# Patient Record
Sex: Male | Born: 1945 | Race: White | Hispanic: No | Smoking: Light tobacco smoker
Health system: Southern US, Community
[De-identification: ages and names within clinical notes are randomized; demographics above are authoritative.]

## PROBLEM LIST (undated history)

## (undated) DIAGNOSIS — I219 Acute myocardial infarction, unspecified: Secondary | ICD-10-CM

## (undated) DIAGNOSIS — M1990 Unspecified osteoarthritis, unspecified site: Secondary | ICD-10-CM

## (undated) DIAGNOSIS — I2510 Atherosclerotic heart disease of native coronary artery without angina pectoris: Secondary | ICD-10-CM

## (undated) DIAGNOSIS — K635 Polyp of colon: Secondary | ICD-10-CM

## (undated) DIAGNOSIS — IMO0002 Reserved for concepts with insufficient information to code with codable children: Secondary | ICD-10-CM

## (undated) DIAGNOSIS — F4310 Post-traumatic stress disorder, unspecified: Secondary | ICD-10-CM

## (undated) DIAGNOSIS — I519 Heart disease, unspecified: Secondary | ICD-10-CM

## (undated) DIAGNOSIS — E790 Hyperuricemia without signs of inflammatory arthritis and tophaceous disease: Secondary | ICD-10-CM

## (undated) DIAGNOSIS — I10 Essential (primary) hypertension: Secondary | ICD-10-CM

## (undated) DIAGNOSIS — C439 Malignant melanoma of skin, unspecified: Secondary | ICD-10-CM

## (undated) DIAGNOSIS — Z720 Tobacco use: Secondary | ICD-10-CM

## (undated) DIAGNOSIS — I5032 Chronic diastolic (congestive) heart failure: Secondary | ICD-10-CM

## (undated) DIAGNOSIS — Z889 Allergy status to unspecified drugs, medicaments and biological substances status: Secondary | ICD-10-CM

## (undated) DIAGNOSIS — Z8739 Personal history of other diseases of the musculoskeletal system and connective tissue: Secondary | ICD-10-CM

## (undated) DIAGNOSIS — Z9861 Coronary angioplasty status: Secondary | ICD-10-CM

## (undated) DIAGNOSIS — F329 Major depressive disorder, single episode, unspecified: Secondary | ICD-10-CM

## (undated) DIAGNOSIS — E785 Hyperlipidemia, unspecified: Secondary | ICD-10-CM

## (undated) DIAGNOSIS — E119 Type 2 diabetes mellitus without complications: Secondary | ICD-10-CM

## (undated) HISTORY — PX: JOINT REPLACEMENT: SHX530

## (undated) HISTORY — DX: Reserved for concepts with insufficient information to code with codable children: IMO0002

## (undated) HISTORY — PX: CORONARY ANGIOPLASTY WITH STENT PLACEMENT: SHX49

## (undated) HISTORY — PX: TRANSTHORACIC ECHOCARDIOGRAM: SHX275

## (undated) HISTORY — DX: Polyp of colon: K63.5

## (undated) HISTORY — PX: SQUAMOUS CELL CARCINOMA EXCISION: SHX2433

## (undated) HISTORY — PX: INGUINAL HERNIA REPAIR: SUR1180

---
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
## 1989-06-30 DIAGNOSIS — F32A Depression, unspecified: Secondary | ICD-10-CM

## 1989-06-30 HISTORY — DX: Depression, unspecified: F32.A

## 1997-10-30 HISTORY — PX: CORONARY ANGIOPLASTY: SHX604

## 1999-07-01 HISTORY — PX: CARPAL TUNNEL RELEASE: SHX101

## 2012-12-26 ENCOUNTER — Encounter (HOSPITAL_COMMUNITY): Payer: Self-pay | Admitting: Adult Health

## 2012-12-26 ENCOUNTER — Inpatient Hospital Stay (HOSPITAL_COMMUNITY)
Admission: EM | Admit: 2012-12-26 | Discharge: 2012-12-31 | DRG: 247 | Disposition: A | Discharge status: Home / Self Care | Payer: Non-veteran care | Attending: Internal Medicine | Admitting: Internal Medicine

## 2012-12-26 ENCOUNTER — Emergency Department (HOSPITAL_COMMUNITY): Payer: Non-veteran care

## 2012-12-26 DIAGNOSIS — D649 Anemia, unspecified: Secondary | ICD-10-CM | POA: Diagnosis not present

## 2012-12-26 DIAGNOSIS — J019 Acute sinusitis, unspecified: Secondary | ICD-10-CM | POA: Diagnosis present

## 2012-12-26 DIAGNOSIS — J329 Chronic sinusitis, unspecified: Secondary | ICD-10-CM | POA: Diagnosis present

## 2012-12-26 DIAGNOSIS — Z7982 Long term (current) use of aspirin: Secondary | ICD-10-CM

## 2012-12-26 DIAGNOSIS — I10 Essential (primary) hypertension: Secondary | ICD-10-CM | POA: Diagnosis present

## 2012-12-26 DIAGNOSIS — Z79899 Other long term (current) drug therapy: Secondary | ICD-10-CM

## 2012-12-26 DIAGNOSIS — E119 Type 2 diabetes mellitus without complications: Secondary | ICD-10-CM | POA: Diagnosis present

## 2012-12-26 DIAGNOSIS — E785 Hyperlipidemia, unspecified: Secondary | ICD-10-CM | POA: Diagnosis present

## 2012-12-26 DIAGNOSIS — R079 Chest pain, unspecified: Secondary | ICD-10-CM

## 2012-12-26 DIAGNOSIS — Z955 Presence of coronary angioplasty implant and graft: Secondary | ICD-10-CM

## 2012-12-26 DIAGNOSIS — I252 Old myocardial infarction: Secondary | ICD-10-CM

## 2012-12-26 DIAGNOSIS — I2584 Coronary atherosclerosis due to calcified coronary lesion: Secondary | ICD-10-CM | POA: Diagnosis present

## 2012-12-26 DIAGNOSIS — I2510 Atherosclerotic heart disease of native coronary artery without angina pectoris: Principal | ICD-10-CM | POA: Diagnosis present

## 2012-12-26 DIAGNOSIS — J09X2 Influenza due to identified novel influenza A virus with other respiratory manifestations: Secondary | ICD-10-CM | POA: Diagnosis present

## 2012-12-26 DIAGNOSIS — J111 Influenza due to unidentified influenza virus with other respiratory manifestations: Secondary | ICD-10-CM | POA: Diagnosis present

## 2012-12-26 DIAGNOSIS — F17200 Nicotine dependence, unspecified, uncomplicated: Secondary | ICD-10-CM | POA: Diagnosis present

## 2012-12-26 DIAGNOSIS — Z9861 Coronary angioplasty status: Secondary | ICD-10-CM

## 2012-12-26 DIAGNOSIS — I200 Unstable angina: Secondary | ICD-10-CM | POA: Diagnosis present

## 2012-12-26 HISTORY — DX: Essential (primary) hypertension: I10

## 2012-12-26 HISTORY — DX: Tobacco use: Z72.0

## 2012-12-26 HISTORY — DX: Hyperlipidemia, unspecified: E78.5

## 2012-12-26 LAB — PROTIME-INR: Prothrombin Time: 14.6 seconds (ref 11.6–15.2)

## 2012-12-26 LAB — BASIC METABOLIC PANEL
CO2: 25 mEq/L (ref 19–32)
Calcium: 9.1 mg/dL (ref 8.4–10.5)
Creatinine, Ser: 0.93 mg/dL (ref 0.50–1.35)

## 2012-12-26 LAB — CBC
MCH: 30.6 pg (ref 26.0–34.0)
MCV: 86.8 fL (ref 78.0–100.0)
Platelets: 147 10*3/uL — ABNORMAL LOW (ref 150–400)
RBC: 4.61 MIL/uL (ref 4.22–5.81)
RDW: 14.2 % (ref 11.5–15.5)

## 2012-12-26 MED ORDER — NITROGLYCERIN 0.4 MG SL SUBL
0.4000 mg | SUBLINGUAL_TABLET | SUBLINGUAL | Status: DC | PRN
  Administered 2012-12-26: 0.4 mg via SUBLINGUAL
  Filled 2012-12-26 (×3): qty 25

## 2012-12-26 MED ORDER — ACETAMINOPHEN 325 MG PO TABS
650.0000 mg | ORAL_TABLET | Freq: Once | ORAL | Status: AC
  Administered 2012-12-26: 650 mg via ORAL
  Filled 2012-12-26: qty 2

## 2012-12-26 MED ORDER — NITROGLYCERIN 0.4 MG SL SUBL
0.4000 mg | SUBLINGUAL_TABLET | SUBLINGUAL | Status: DC | PRN
  Administered 2012-12-26: 0.4 mg via SUBLINGUAL

## 2012-12-26 MED ORDER — NITROGLYCERIN 2 % TD OINT
0.5000 [in_us] | TOPICAL_OINTMENT | Freq: Four times a day (QID) | TRANSDERMAL | Status: DC
  Administered 2012-12-26 – 2012-12-30 (×13): 0.5 [in_us] via TOPICAL
  Filled 2012-12-26 (×2): qty 1
  Filled 2012-12-26: qty 30

## 2012-12-26 MED ORDER — ASPIRIN 325 MG PO TABS: 325.0000 mg | ORAL_TABLET | ORAL | Status: AC

## 2012-12-26 NOTE — ED Notes (Signed)
Presents with chest tightness that began today associated with sharp upper back pain and left leg numbness and lightheadedness and SOB. Pt states, "It feels the same as when I had a heart attack" denies nausea.  Rest makes pain better.

## 2012-12-26 NOTE — ED Provider Notes (Signed)
I saw and evaluated the patient, reviewed the resident's note and I agree with the findings and plan.  Agree with EKG interpretation if present.   Pt with history of CAD, having CP and L arm pain today. EKG and trop neg. Plan admit for rule out.   Wadie Liew B. Bernette Mayers, MD 12/26/12 2231

## 2012-12-26 NOTE — H&P (Signed)
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  Feels similar to his heart attack in 1999.  [facility]  Has had several stress testing since then, assumes they were normal, no cath since 1999.  He took some asa with some relief of the pain.  However his pain was not fully relieved until he received ntg sl in the ED.  He is currently cp free.  No sob.  No fevers.  No le edema or swelling.  No cough.  Review of Systems:  Positive and negative as per HPI otherwise all other systems are negative  Past Medical History: Past Medical History  Diagnosis Date  . Hypertension   . Acute MI   . Hyperlipidemia     Medications: Prior to Admission medications   Medication Sig Start Date End Date Taking? Authorizing Provider  amLODipine (NORVASC) 10 MG tablet Take 10 mg by mouth daily.   Yes Historical Provider, MD  aspirin 81 MG chewable tablet Chew 81 mg by mouth daily.   Yes Historical Provider, MD  lisinopril (PRINIVIL,ZESTRIL) 40 MG tablet Take 20 mg by mouth daily.   Yes Historical Provider, MD  metFORMIN (GLUMETZA) 500 MG (MOD) 24 hr tablet Take 500 mg by mouth daily with breakfast.   Yes Historical Provider, MD  pravastatin (PRAVACHOL) 40 MG tablet Take 20 mg by mouth daily.   Yes Historical Provider, MD    Allergies:  No Known Allergies  Social History:  reports that he has been smoking Cigars.  He does not have any smokeless tobacco history on file. He reports that  drinks alcohol. He reports that he does not use illicit drugs.  Family History: History reviewed. No pertinent family history.  Physical Exam: Filed Vitals:   12/26/12 1823 12/26/12 2030 12/26/12 2045 12/26/12 2100   BP: 157/82 134/73 143/72 144/80  Pulse: 80 79 81 78  Temp: 99 F (37.2 C)     Resp: 18 16 23 20   SpO2: 96% 96% 97% 96%   General appearance: alert, cooperative and no distress Head: Normocephalic, without obvious abnormality, atraumatic Eyes: negative Nose: Nares normal. Septum midline. Mucosa normal. No drainage or sinus tenderness. Neck: no carotid bruit, no JVD, supple, symmetrical, trachea midline and thyroid not enlarged, symmetric, no tenderness/mass/nodules Lungs: clear to auscultation bilaterally Heart: regular rate and rhythm, S1, S2 normal, no murmur, click, rub or gallop Abdomen: soft, non-tender; bowel sounds normal; no masses,  no organomegaly Extremities: extremities normal, atraumatic, no cyanosis or edema Pulses: 2+ and symmetric Skin: Skin color, texture, turgor normal. No rashes or lesions Neurologic: Grossly normal    Labs on Admission:   Recent Labs  12/26/12 1839  NA 137  K 3.9  CL 101  CO2 25  GLUCOSE 105*  BUN 16  CREATININE 0.93  CALCIUM 9.1    Recent Labs  12/26/12 1839  WBC 5.3  HGB 14.1  HCT 40.0  MCV 86.8  PLT 147*   Radiological Exams on Admission: Dg Chest 2 View  12/26/2012  *RADIOLOGY REPORT*  Clinical Data: Chest pain.  Diabetic, hypertensive.  CHEST - 2 VIEW  Comparison: None.  Findings: Cardiomegaly.  Clear lung fields.  No effusion or pneumothorax.  Bones unremarkable.  IMPRESSION: No active cardiopulmonary disease.   Original Report Authenticated By: Davonna Belling, M.D.     Assessment/Plan 67 yo male with unstable angina  Principal Problem:   Unstable angina Active Problems:   Hypertension   Hyperlipidemia   CAD S/P percutaneous coronary angioplasty  Full dose lovenox.  Asa.  Hold norvasc and start bblocker.  Statin.  Serial enzymes.  ekg with no acute changes with tw inversion inf lead.  ntg paste.  Echo in am.  Probably needs cardiology consult for possible cath?/stress testing.  Full code.  Tele.  DAVID,RACHAL  A 12/26/2012, 9:52 PM

## 2012-12-26 NOTE — ED Provider Notes (Signed)
History     CSN: [id number]  Arrival date & time 12/26/12  4098   First MD Initiated Contact with Patient 12/26/12 2042      Chief Complaint  Patient presents with  . Chest Pain    (Consider location/radiation/quality/duration/timing/severity/associated sxs/prior treatment) Patient is a 67 y.o. male presenting with chest pain.  Chest Pain Pain location:  Substernal area Pain quality: tightness   Pain radiates to:  L arm Pain radiates to the back: yes   Pain severity:  Moderate Onset quality:  Unable to specify Duration:  8 hours Timing:  Constant Progression:  Improving Chronicity:  New Context: at rest   Context: not breathing, not eating, not lifting, no movement and no stress   Relieved by:  Aspirin Worsened by:  Nothing tried Ineffective treatments:  None tried Associated symptoms: back pain and cough   Associated symptoms: no abdominal pain, no anorexia, no anxiety, no diaphoresis, no fatigue, no fever, no headache, no nausea, no near-syncope, no numbness, no palpitations, no shortness of breath, not vomiting and no weakness   Risk factors: coronary artery disease, hypertension and male sex     Past Medical History  Diagnosis Date  . Hypertension   . Acute MI   . Hyperlipidemia     History reviewed. No pertinent past surgical history.  History reviewed. No pertinent family history.  History  Substance Use Topics  . Smoking status: Current Every Day Smoker    Types: Cigars  . Smokeless tobacco: Not on file  . Alcohol Use: Yes      Review of Systems  Constitutional: Negative for fever, chills, diaphoresis, activity change, appetite change and fatigue.  HENT: Negative for congestion, sore throat, facial swelling, rhinorrhea, drooling, neck pain and voice change.   Respiratory: Positive for cough. Negative for shortness of breath and stridor.   Cardiovascular: Positive for chest pain. Negative for palpitations and near-syncope.  Gastrointestinal:  Negative for nausea, vomiting, abdominal pain, diarrhea, abdominal distention and anorexia.  Endocrine: Negative for polydipsia and polyuria.  Genitourinary: Negative for dysuria, urgency, frequency and decreased urine volume.  Musculoskeletal: Positive for back pain. Negative for gait problem.  Skin: Negative for color change and wound.  Neurological: Negative for facial asymmetry, weakness, numbness and headaches.  Hematological: Does not bruise/bleed easily.  Psychiatric/Behavioral: Negative for confusion and agitation.    Allergies  Review of patient's allergies indicates no known allergies.  Home Medications   Current Outpatient Rx  Name  Route  Sig  Dispense  Refill  . amLODipine (NORVASC) 10 MG tablet   Oral   Take 10 mg by mouth daily.         Marland Kitchen aspirin 81 MG chewable tablet   Oral   Chew 81 mg by mouth daily.         Marland Kitchen lisinopril (PRINIVIL,ZESTRIL) 40 MG tablet   Oral   Take 20 mg by mouth daily.         . metFORMIN (GLUMETZA) 500 MG (MOD) 24 hr tablet   Oral   Take 500 mg by mouth daily with breakfast.         . pravastatin (PRAVACHOL) 40 MG tablet   Oral   Take 20 mg by mouth daily.           BP 122/79  Pulse 72  Temp(Src) 98.5 F (36.9 C) (Oral)  Resp 17  SpO2 96%  Physical Exam  Constitutional: He is oriented to person, place, and time. He appears well-developed and well-nourished. No  distress.  HENT:  Head: Normocephalic and atraumatic.  Mouth/Throat: No oropharyngeal exudate.  Eyes: Pupils are equal, round, and reactive to light.  Neck: Normal range of motion. Neck supple.  Cardiovascular: Normal rate, regular rhythm and normal heart sounds.  Exam reveals no gallop and no friction rub.   No murmur heard. Pulmonary/Chest: Effort normal and breath sounds normal. No respiratory distress. He has no wheezes. He has no rales.  Abdominal: Soft. Bowel sounds are normal. He exhibits no distension and no mass. There is no tenderness. There is no  rebound and no guarding.  Musculoskeletal: Normal range of motion. He exhibits no edema and no tenderness.  Neurological: He is alert and oriented to person, place, and time.  Skin: Skin is warm and dry.  Psychiatric: He has a normal mood and affect.    ED Course  Procedures (including critical care time)  Labs Reviewed  CBC - Abnormal; Notable for the following:    Platelets 147 (*)    All other components within normal limits  BASIC METABOLIC PANEL - Abnormal; Notable for the following:    Glucose, Bld 105 (*)    GFR calc non Af Amer 87 (*)    All other components within normal limits  PROTIME-INR  POCT I-STAT TROPONIN I   Dg Chest 2 View  12/26/2012  *RADIOLOGY REPORT*  Clinical Data: Chest pain.  Diabetic, hypertensive.  CHEST - 2 VIEW  Comparison: None.  Findings: Cardiomegaly.  Clear lung fields.  No effusion or pneumothorax.  Bones unremarkable.  IMPRESSION: No active cardiopulmonary disease.   Original Report Authenticated By: Davonna Belling, M.D.      1. Chest pain   2. CAD S/P percutaneous coronary angioplasty   3. Hyperlipidemia   4. Hypertension   5. Unstable angina      Date: 12/26/2012  Rate: 90  Rhythm: normal sinus rhythm  QRS Axis: normal  Intervals: normal  ST/T Wave abnormalities: nonspecific T wave changes and q waves inf leads  Conduction Disutrbances:none  Narrative Interpretation:   Old EKG Reviewed: none available    MDM  Pt is a 67 y.o. 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  At worst pain 5/10, currently 2/10.  Took ASA prior to arrival.  He has had mild cough since January and is recovering from bronchitis, but has had no recent fevers, leg pain or swelling. EKG NSR w/ non-specific T waves changes.  Trop negative, CXR w/ cardiomegaly.  Doubt DVT/PE, dissection. Given PMHx there is  some concern for ACS.  Other possibilities include GERD/gastritis, MSK pain.  Have consulted unassigned medical team for CP rule out.   10:00 PM pain resolved after 2 SL NTG, paste placed.  [facility] will admit for ACS r/o.    1. Chest pain   2. CAD S/P percutaneous coronary angioplasty   3. Hyperlipidemia   4. Hypertension   5. Unstable angina      Labs and imaging considered in decision making, reviewed by myself.  Imaging interpreted by radiology. Pt care discussed with my attending, Dr. Bernette Mayers.    Toy Cookey, MD 12/26/12 [phone]

## 2012-12-27 ENCOUNTER — Encounter (HOSPITAL_COMMUNITY): Payer: Self-pay | Admitting: General Practice

## 2012-12-27 ENCOUNTER — Encounter (HOSPITAL_COMMUNITY)
Admission: EM | Disposition: A | Discharge status: Home / Self Care | Payer: Self-pay | Source: Home / Self Care | Attending: Internal Medicine

## 2012-12-27 DIAGNOSIS — I517 Cardiomegaly: Secondary | ICD-10-CM

## 2012-12-27 DIAGNOSIS — J329 Chronic sinusitis, unspecified: Secondary | ICD-10-CM

## 2012-12-27 DIAGNOSIS — I2510 Atherosclerotic heart disease of native coronary artery without angina pectoris: Secondary | ICD-10-CM

## 2012-12-27 DIAGNOSIS — I200 Unstable angina: Secondary | ICD-10-CM

## 2012-12-27 HISTORY — PX: LEFT HEART CATHETERIZATION WITH CORONARY ANGIOGRAM: SHX5451

## 2012-12-27 LAB — CBC
MCH: 31 pg (ref 26.0–34.0)
Platelets: 125 10*3/uL — ABNORMAL LOW (ref 150–400)
RBC: 4.23 MIL/uL (ref 4.22–5.81)
WBC: 4.2 10*3/uL (ref 4.0–10.5)

## 2012-12-27 LAB — HEMOGLOBIN A1C: Mean Plasma Glucose: 131 mg/dL — ABNORMAL HIGH (ref <117)

## 2012-12-27 LAB — LIPID PANEL
Cholesterol: 142 mg/dL (ref 0–200)
HDL: 34 mg/dL — ABNORMAL LOW (ref >39)
Total CHOL/HDL Ratio: 4.2 RATIO
Triglycerides: 116 mg/dL (ref <150)
VLDL: 23 mg/dL (ref 0–40)

## 2012-12-27 LAB — GLUCOSE, CAPILLARY
Glucose-Capillary: 97 mg/dL (ref 70–99)
Glucose-Capillary: 143 mg/dL — ABNORMAL HIGH (ref 70–99)
Glucose-Capillary: 97 mg/dL (ref 70–99)

## 2012-12-27 LAB — CREATININE, SERUM: Creatinine, Ser: 1 mg/dL (ref 0.50–1.35)

## 2012-12-27 SURGERY — LEFT HEART CATHETERIZATION WITH CORONARY ANGIOGRAM
Anesthesia: LOCAL

## 2012-12-27 MED ORDER — TICAGRELOR 90 MG PO TABS
90.0000 mg | ORAL_TABLET | Freq: Two times a day (BID) | ORAL | Status: DC
  Administered 2012-12-27 – 2012-12-31 (×8): 90 mg via ORAL
  Filled 2012-12-27 (×9): qty 1

## 2012-12-27 MED ORDER — HEPARIN (PORCINE) IN NACL 2-0.9 UNIT/ML-% IJ SOLN
INTRAMUSCULAR | Status: AC
  Filled 2012-12-27: qty 1000

## 2012-12-27 MED ORDER — FLUTICASONE PROPIONATE 50 MCG/ACT NA SUSP
2.0000 | Freq: Every day | NASAL | Status: DC
  Administered 2012-12-27 – 2012-12-29 (×3): 2 via NASAL
  Filled 2012-12-27: qty 16

## 2012-12-27 MED ORDER — BENZONATATE 100 MG PO CAPS
100.0000 mg | ORAL_CAPSULE | Freq: Three times a day (TID) | ORAL | Status: DC | PRN
  Administered 2012-12-27: 100 mg via ORAL
  Filled 2012-12-27: qty 1

## 2012-12-27 MED ORDER — MIDAZOLAM HCL 2 MG/2ML IJ SOLN
INTRAMUSCULAR | Status: AC
  Filled 2012-12-27: qty 2

## 2012-12-27 MED ORDER — SODIUM CHLORIDE 0.9 % IV SOLN: 250.0000 mL | INTRAVENOUS | Status: DC | PRN

## 2012-12-27 MED ORDER — CEFUROXIME AXETIL 250 MG PO TABS
250.0000 mg | ORAL_TABLET | Freq: Two times a day (BID) | ORAL | Status: DC
  Administered 2012-12-27 – 2012-12-31 (×7): 250 mg via ORAL
  Filled 2012-12-27 (×11): qty 1

## 2012-12-27 MED ORDER — FENTANYL CITRATE 0.05 MG/ML IJ SOLN
INTRAMUSCULAR | Status: AC
  Filled 2012-12-27: qty 2

## 2012-12-27 MED ORDER — SODIUM CHLORIDE 0.9 % IV SOLN: INTRAVENOUS | Status: AC

## 2012-12-27 MED ORDER — NITROGLYCERIN 0.4 MG SL SUBL: 0.4000 mg | SUBLINGUAL_TABLET | SUBLINGUAL | Status: DC | PRN

## 2012-12-27 MED ORDER — ISOSORBIDE MONONITRATE ER 30 MG PO TB24
30.0000 mg | ORAL_TABLET | Freq: Every day | ORAL | Status: DC
  Administered 2012-12-27 – 2012-12-30 (×4): 30 mg via ORAL
  Filled 2012-12-27 (×5): qty 1

## 2012-12-27 MED ORDER — LORATADINE 10 MG PO TABS
10.0000 mg | ORAL_TABLET | Freq: Every day | ORAL | Status: DC
  Administered 2012-12-27 – 2012-12-31 (×5): 10 mg via ORAL
  Filled 2012-12-27 (×5): qty 1

## 2012-12-27 MED ORDER — METOPROLOL TARTRATE 25 MG PO TABS
25.0000 mg | ORAL_TABLET | Freq: Two times a day (BID) | ORAL | Status: DC
  Administered 2012-12-27 – 2012-12-31 (×10): 25 mg via ORAL
  Filled 2012-12-27 (×11): qty 1

## 2012-12-27 MED ORDER — ACETAMINOPHEN 325 MG PO TABS
650.0000 mg | ORAL_TABLET | ORAL | Status: DC | PRN
  Administered 2012-12-27 – 2012-12-29 (×5): 650 mg via ORAL

## 2012-12-27 MED ORDER — ASPIRIN 81 MG PO CHEW: 324.0000 mg | CHEWABLE_TABLET | ORAL | Status: AC

## 2012-12-27 MED ORDER — SIMVASTATIN 20 MG PO TABS
20.0000 mg | ORAL_TABLET | Freq: Every day | ORAL | Status: DC
  Filled 2012-12-27 (×2): qty 1

## 2012-12-27 MED ORDER — SODIUM CHLORIDE 0.9 % IJ SOLN
3.0000 mL | INTRAMUSCULAR | Status: DC | PRN
  Administered 2012-12-27: 3 mL via INTRAVENOUS

## 2012-12-27 MED ORDER — ENOXAPARIN SODIUM 100 MG/ML ~~LOC~~ SOLN
100.0000 mg | SUBCUTANEOUS | Status: DC
  Filled 2012-12-27: qty 1

## 2012-12-27 MED ORDER — ASPIRIN 81 MG PO CHEW
324.0000 mg | CHEWABLE_TABLET | ORAL | Status: AC
  Administered 2012-12-27: 324 mg via ORAL
  Filled 2012-12-27: qty 4

## 2012-12-27 MED ORDER — SODIUM CHLORIDE 0.9 % IJ SOLN: 3.0000 mL | INTRAMUSCULAR | Status: DC | PRN

## 2012-12-27 MED ORDER — ONDANSETRON HCL 4 MG/2ML IJ SOLN: 4.0000 mg | Freq: Four times a day (QID) | INTRAMUSCULAR | Status: DC | PRN

## 2012-12-27 MED ORDER — ASPIRIN EC 81 MG PO TBEC
81.0000 mg | DELAYED_RELEASE_TABLET | Freq: Every day | ORAL | Status: DC
  Administered 2012-12-27 – 2012-12-31 (×5): 81 mg via ORAL
  Filled 2012-12-27 (×5): qty 1

## 2012-12-27 MED ORDER — VERAPAMIL HCL 2.5 MG/ML IV SOLN
INTRAVENOUS | Status: AC
  Filled 2012-12-27: qty 2

## 2012-12-27 MED ORDER — SODIUM CHLORIDE 0.9 % IJ SOLN
3.0000 mL | Freq: Two times a day (BID) | INTRAMUSCULAR | Status: DC
  Administered 2012-12-27 – 2012-12-30 (×6): 3 mL via INTRAVENOUS

## 2012-12-27 MED ORDER — ENOXAPARIN SODIUM 100 MG/ML ~~LOC~~ SOLN
100.0000 mg | Freq: Two times a day (BID) | SUBCUTANEOUS | Status: DC
  Filled 2012-12-27 (×2): qty 1

## 2012-12-27 MED ORDER — LIDOCAINE HCL (PF) 1 % IJ SOLN
INTRAMUSCULAR | Status: AC
  Filled 2012-12-27: qty 30

## 2012-12-27 MED ORDER — SODIUM CHLORIDE 0.9 % IJ SOLN
3.0000 mL | Freq: Two times a day (BID) | INTRAMUSCULAR | Status: DC
  Administered 2012-12-27: 3 mL via INTRAVENOUS

## 2012-12-27 MED ORDER — ASPIRIN 300 MG RE SUPP
300.0000 mg | RECTAL | Status: AC
  Filled 2012-12-27: qty 1

## 2012-12-27 MED ORDER — LISINOPRIL 20 MG PO TABS
20.0000 mg | ORAL_TABLET | Freq: Every day | ORAL | Status: DC
Start: 2012-12-27 — End: 2012-12-31
  Administered 2012-12-27 – 2012-12-31 (×5): 20 mg via ORAL
  Filled 2012-12-27 (×5): qty 1

## 2012-12-27 MED ORDER — DIAZEPAM 5 MG PO TABS
5.0000 mg | ORAL_TABLET | ORAL | Status: AC
  Administered 2012-12-27: 5 mg via ORAL
  Filled 2012-12-27: qty 1

## 2012-12-27 MED ORDER — ACETAMINOPHEN 325 MG PO TABS
650.0000 mg | ORAL_TABLET | ORAL | Status: DC | PRN
  Administered 2012-12-27 (×2): 650 mg via ORAL
  Filled 2012-12-27 (×8): qty 2

## 2012-12-27 MED ORDER — HEPARIN SODIUM (PORCINE) 1000 UNIT/ML IJ SOLN
INTRAMUSCULAR | Status: AC
  Filled 2012-12-27: qty 1

## 2012-12-27 MED ORDER — HEPARIN SODIUM (PORCINE) 5000 UNIT/ML IJ SOLN
5000.0000 [IU] | Freq: Three times a day (TID) | INTRAMUSCULAR | Status: DC
  Administered 2012-12-27 – 2012-12-29 (×7): 5000 [IU] via SUBCUTANEOUS
  Filled 2012-12-27 (×11): qty 1

## 2012-12-27 NOTE — Progress Notes (Signed)
Patient ID: Kenneth [name]  male  MRN:[id number]    DOB: 08/24/1946    DOA: 12/26/2012  PCP: Default, Provider, MD  Assessment/Plan: Principal Problem:   Unstable angina:typical anginal pain with exertion, high risk factors of prior MI in 1999, hypertension, hyperlipidemia, no recent cardiac workup since 2009 - Chest pain currently resolved, 2 sets of cardiac enzymes negative, qw and twi in III, avf, no prior EKG to compare with - 2-D echo today, discussed with [facility] cardiology for consultation, may need cardiac cath vs stress test, will defer the decision to cardiology - cont ASA, BB, lisinopril, zocor, currently on full dose Lovenox   Active Problems:   Hypertension:BP stable     Hyperlipidemia: LDL 85 - cont zocor    Unspecified sinusitis (chronic) with fever - placed on flonase, ceftin x 10days, claritin   DVT Prophylaxis: Lovenox  Code Status: Full code  Disposition:    Subjective:  Chest pain resolved, No SOB, N/V, ate breakfast  Objective: Weight change:   Intake/Output Summary (Last 24 hours) at 12/27/12 1036 Last data filed at 12/27/12 0152  Gross per 24 hour  Intake     75 ml  Output    200 ml  Net   -125 ml   Blood pressure 143/73, pulse 87, temperature 98.8 F (37.1 C), temperature source Oral, resp. rate 18, height 5' 8" (1.727 m), weight 103.556 kg (228 lb 4.8 oz), SpO2 95.00%.  Physical Exam: General: Alert and awake, oriented x3, not in any acute distress. HEENT: anicteric sclera, pupils reactive to light and accommodation, EOMI CVS: S1-S2 clear, no murmur rubs or gallops Chest: clear to auscultation bilaterally, no wheezing, rales or rhonchi Abdomen: soft nontender, nondistended, normal bowel sounds, no organomegaly Extremities: no cyanosis, clubbing or edema noted bilaterally Neuro: Cranial nerves II-XII intact, no focal neurological deficits  Lab Results: Basic Metabolic Panel:  Recent Labs Lab 12/26/12 1839  NA 137  K 3.9  CL 101  CO2  25  GLUCOSE 105*  BUN 16  CREATININE 0.93  CALCIUM 9.1   CBC:  Recent Labs Lab 12/26/12 1839  WBC 5.3  HGB 14.1  HCT 40.0  MCV 86.8  PLT 147*   Cardiac Enzymes:  Recent Labs Lab 12/27/12 0110 12/27/12 0605  TROPONINI <0.30 <0.30   BNP: No components found with this basename: POCBNP,  CBG:  Recent Labs Lab 12/27/12 0046 12/27/12 0735  GLUCAP 123* 119*     Micro Results: No results found for this or any previous visit (from the past 240 hour(s)).  Studies/Results: Dg Chest 2 View  12/26/2012  *RADIOLOGY REPORT*  Clinical Data: Chest pain.  Diabetic, hypertensive.  CHEST - 2 VIEW  Comparison: None.  Findings: Cardiomegaly.  Clear lung fields.  No effusion or pneumothorax.  Bones unremarkable.  IMPRESSION: No active cardiopulmonary disease.   Original Report Authenticated By: John Curnes, M.D.     Medications: Scheduled Meds: . aspirin  324 mg Oral NOW   Or  . aspirin  300 mg Rectal NOW  . aspirin EC  81 mg Oral Daily  . aspirin  325 mg Oral STAT  . cefUROXime  250 mg Oral BID WC  . enoxaparin (LOVENOX) injection  100 mg Subcutaneous Q12H  . enoxaparin (LOVENOX) injection  100 mg Subcutaneous NOW  . fluticasone  2 spray Each Nare Daily  . lisinopril  20 mg Oral Daily  . loratadine  10 mg Oral Daily  . metoprolol tartrate  25 mg Oral BID  . nitroGLYCERIN    0.5 inch Topical Q6H  . simvastatin  20 mg Oral q1800  . sodium chloride  3 mL Intravenous Q12H      LOS: 1 day   RAI,RIPUDEEP M.D. [facility] 12/27/2012, 10:36 AM Pager: [phone]  If 7PM-7AM, please contact night-coverage [email or web address] Password [email or web address]  

## 2012-12-27 NOTE — CV Procedure (Addendum)
Cardiac Cath Procedure Note:  Indication: [place]  Procedures performed:  1) Selective coronary angiography 2) Left heart catheterization 3) Left ventriculogram  Description of procedure:   The risks and indication of the procedure were explained. Consent was signed and placed on the chart. An appropriate timeout was taken prior to the procedure. After a normal Allen's test was confirmed, the right wrist was prepped and draped in the routine sterile fashion and anesthetized with 1% local lidocaine.   A 5 FR arterial sheath was then placed in the right radial artery using a modified Seldinger technique. Systemic heparin was administered. 3mg  IV verapamil was given through the sheath. Standard catheters including a JL 4, AR-2 and straight pigtail were used. All catheter exchanges were made over a wire.  Complications:  None apparent  Findings:  Ao Pressure: 113/69 (88) LV Pressure:  116/15/23 There was no signficant gradient across the aortic valve on pullback.  Left main:  Mild plaque  LAD:  Diffuse mild plaque throughout with 60-70% lesion in midsection  LCX: Gives off 2 OMs and 2 PLs and large atrial branch. 30% proximal lesion in LCX. Diffuse 40% plaque in mid AV groove LCX. OM-1 40% mid.   RCA: Large dominant vessel with diffuse caclific disease. There is moderate sized PDA and PL1 and very large PL2. In RCA proper 30-40% ostial. Diffuse 40-50% in midsection. In mid to distal segment focal 80-90% lesion (mildly hazy). Followed by diffuse 30% plaque then focal 70-80% stenosis  LV-gram done in the RAO projection: Ejection fraction = 40% inferior wall hypokinesis  Assessment: 1. 3V-CAD as described above with high grade lesion in mid to distal RCA and borderline lesion in mid LAD 2. 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 [place]. RCA lesion seems to be culprit lesion for his symptoms however LAD lesion may also be significant. Will plan PCI RCA and  flow-wire interrogation of LAD on Monday first case (unable to do case this afternoon due to 2 STEMISs). Start Brillinta.   Yui Mulvaney,MD 4:16 PM

## 2012-12-27 NOTE — Interval H&P Note (Signed)
History and Physical Interval Note:  12/27/2012 3:17 PM  Kenneth [name]  has presented today for surgery, with the diagnosis of [place]  The various methods of treatment have been discussed with the patient and family. After consideration of risks, benefits and other options for treatment, the patient has consented to  Procedure(s): LEFT HEART CATHETERIZATION WITH CORONARY ANGIOGRAM (N/A) as a surgical intervention .  The patient's history has been reviewed, patient examined, no change in status, stable for surgery.  I have reviewed the patient's chart and labs.  Questions were answered to the patient's satisfaction.     [name]

## 2012-12-27 NOTE — Care Management Note (Unsigned)
    Page 1 of 1   12/27/2012     10:44:39 AM   CARE MANAGEMENT NOTE 12/27/2012  Patient:  Kenneth [name], Kenneth [name]   Account Number:  [id number]  Date Initiated:  12/27/2012  Documentation initiated by:  GRAVES-BIGELOW,Starr Engel  Subjective/Objective Assessment:   Pt admitted with cp. Pt has PCP at [facility] in [facility] MD [place].     Action/Plan:   CM did call [facility] to make them aware that pt has been admitted as observation status. Left VM. Will f/u for additional d/c needs.   Anticipated DC Date:  12/28/2012   Anticipated DC Plan:  HOME/SELF CARE      DC Planning Services  CM consult      Choice offered to / List presented to:             Status of service:  In process, will continue to follow Medicare Important Message given?   (If response is "NO", the following Medicare IM given date fields will be blank) Date Medicare IM given:   Date Additional Medicare IM given:    Discharge Disposition:    Per UR Regulation:  Reviewed for med. necessity/level of care/duration of stay  If discussed at Long Length of Stay Meetings, dates discussed:    Comments:

## 2012-12-27 NOTE — Progress Notes (Signed)
*  PRELIMINARY RESULTS* Echocardiogram 2D Echocardiogram has been performed.  Jeryl Columbia 12/27/2012, 9:54 AM

## 2012-12-27 NOTE — H&P (View-Only) (Signed)
Patient ID: Kenneth [name]  male  OZH:[id number]    DOB: [birth year]    DOA: 12/26/2012  PCP: Default, Provider, MD  Assessment/Plan: Principal Problem:   Unstable angina:typical anginal pain with exertion, high risk factors of prior MI in 1999, hypertension, hyperlipidemia, no recent cardiac workup since 2009 - Chest pain currently resolved, 2 sets of cardiac enzymes negative, qw and twi in III, avf, no prior EKG to compare with - 2-D echo today, discussed with [facility] cardiology for consultation, may need cardiac cath vs stress test, will defer the decision to cardiology - cont ASA, BB, lisinopril, zocor, currently on full dose Lovenox   Active Problems:   Hypertension:BP stable     Hyperlipidemia: LDL 85 - cont zocor    Unspecified sinusitis (chronic) with fever - placed on flonase, ceftin x 10days, claritin   DVT Prophylaxis: Lovenox  Code Status: Full code  Disposition:    Subjective:  Chest pain resolved, No SOB, N/V, ate breakfast  Objective: Weight change:   Intake/Output Summary (Last 24 hours) at 12/27/12 1036 Last data filed at 12/27/12 0152  Gross per 24 hour  Intake     75 ml  Output    200 ml  Net   -125 ml   Blood pressure 143/73, pulse 87, temperature 98.8 F (37.1 C), temperature source Oral, resp. rate 18, height 5\' 8"  (1.727 m), weight 103.556 kg (228 lb 4.8 oz), SpO2 95.00%.  Physical Exam: General: Alert and awake, oriented x3, not in any acute distress. HEENT: anicteric sclera, pupils reactive to light and accommodation, EOMI CVS: S1-S2 clear, no murmur rubs or gallops Chest: clear to auscultation bilaterally, no wheezing, rales or rhonchi Abdomen: soft nontender, nondistended, normal bowel sounds, no organomegaly Extremities: no cyanosis, clubbing or edema noted bilaterally Neuro: Cranial nerves II-XII intact, no focal neurological deficits  Lab Results: Basic Metabolic Panel:  Recent Labs Lab 12/26/12 1839  NA 137  K 3.9  CL 101  CO2  25  GLUCOSE 105*  BUN 16  CREATININE 0.93  CALCIUM 9.1   CBC:  Recent Labs Lab 12/26/12 1839  WBC 5.3  HGB 14.1  HCT 40.0  MCV 86.8  PLT 147*   Cardiac Enzymes:  Recent Labs Lab 12/27/12 0110 12/27/12 0605  TROPONINI <0.30 <0.30   BNP: No components found with this basename: POCBNP,  CBG:  Recent Labs Lab 12/27/12 0046 12/27/12 0735  GLUCAP 123* 119*     Micro Results: No results found for this or any previous visit (from the past 240 hour(s)).  Studies/Results: Dg Chest 2 View  12/26/2012  *RADIOLOGY REPORT*  Clinical Data: Chest pain.  Diabetic, hypertensive.  CHEST - 2 VIEW  Comparison: None.  Findings: Cardiomegaly.  Clear lung fields.  No effusion or pneumothorax.  Bones unremarkable.  IMPRESSION: No active cardiopulmonary disease.   Original Report Authenticated By: Davonna Belling, M.D.     Medications: Scheduled Meds: . aspirin  324 mg Oral NOW   Or  . aspirin  300 mg Rectal NOW  . aspirin EC  81 mg Oral Daily  . aspirin  325 mg Oral STAT  . cefUROXime  250 mg Oral BID WC  . enoxaparin (LOVENOX) injection  100 mg Subcutaneous Q12H  . enoxaparin (LOVENOX) injection  100 mg Subcutaneous NOW  . fluticasone  2 spray Each Nare Daily  . lisinopril  20 mg Oral Daily  . loratadine  10 mg Oral Daily  . metoprolol tartrate  25 mg Oral BID  . nitroGLYCERIN  0.5 inch Topical Q6H  . simvastatin  20 mg Oral q1800  . sodium chloride  3 mL Intravenous Q12H      LOS: 1 day   Kenneth [name] M.D. [facility] 12/27/2012, 10:36 AM Pager: [phone]  If 7PM-7AM, please contact night-coverage [email or web address] Password [email or web address]

## 2012-12-27 NOTE — Progress Notes (Signed)
UR Completed Dametria Tuzzolino Graves-Bigelow, RN,BSN [phone]  

## 2012-12-27 NOTE — Consult Note (Signed)
Cardiology Consult Note   Patient ID: Kenneth [name] MRN: [id number], DOB/AGE: 1946-06-06   Admit date: 12/26/2012 Date of Consult: 12/27/2012  Primary Physician: Default, Provider, MD Primary Cardiologist: New  Reason for consult: chest pain  HPI: Kenneth [name] is a 67 y.o. male with PMHx s/f CAD (100% blockage in 1999 s/p angioplasty), tobacco abuse, DM2, HTN and HLD who was admitted to [facility] on 12/26/12 with chest pain.   The patient receives his care from the [place]. He reports having a 100% blockage in 1999, but did not receive a stent or cardiac surgery. He has had multiple subsequent stress tests which were "normal." Most recently in 2009. He does not follow-up with a cardiologist regularly at the [place].   He was in his USOH until yesterday. He works as a Arboriculturist. He was performing typical everyday activities (mopping, etc) when he suddenly experienced a band-like chest pressure radiating to his left arm with associated lightheadedness and flushing rated at a 5-6/10. This was quite reminiscent of his prior MI in 1999. He sat down to rest, took two full-dose ASA and the pain subsided. A coworker saw him, and advised that he present to the ED. He does note one prior similar episode while doing yardwork in 2010. He went to the ED, received an EKG which was "normal" and went home. He denies LE edema, orthopnea, PND, palpitations or syncope. No active bleeding. He has been having fevers and congestion recently which he attributes to bronchitis. Family hx- sister with a PM. Occasional cigar use. Drinks 6 pack of beer/weekend. No elicit drugs or supplements. He does report fairly new onset diabetes for which he takes metformin.  In the ED, EKG revealed inferior TWIs, not ST changes. Initial trop-I returned WNL. CXR demonstrated no acute cardiopulmonary abnormality. CBC and BMET returned WNL. The patient hospitalized by the medicine service for overnight rule out. He was started on ACS dose  Lovenox. ASA, ACEi and statin were continued. BB added. Two subsequent troponins returned WNL. A lipid panel revealed LDL 85, HDL 34, TC 142, TG 116. 2D echo pending. Medicine has placed him on abx for sinusitis.   Problem List: Past Medical History  Diagnosis Date  . Hypertension   . Acute MI   . Hyperlipidemia   . Tobacco abuse     Past Surgical History  Procedure Laterality Date  . Coronary angioplasty  1999     Allergies: No Known Allergies  Home Medications: Prior to Admission medications   Medication Sig Start Date End Date Taking? Authorizing Provider  amLODipine (NORVASC) 10 MG tablet Take 10 mg by mouth daily.   Yes Historical Provider, MD  aspirin 81 MG chewable tablet Chew 81 mg by mouth daily.   Yes Historical Provider, MD  lisinopril (PRINIVIL,ZESTRIL) 40 MG tablet Take 20 mg by mouth daily.   Yes Historical Provider, MD  metFORMIN (GLUMETZA) 500 MG (MOD) 24 hr tablet Take 500 mg by mouth daily with breakfast.   Yes Historical Provider, MD  pravastatin (PRAVACHOL) 40 MG tablet Take 20 mg by mouth daily.   Yes Historical Provider, MD    Inpatient Medications:  . aspirin  324 mg Oral NOW   Or  . aspirin  300 mg Rectal NOW  . aspirin  324 mg Oral Pre-Cath  . aspirin EC  81 mg Oral Daily  . aspirin  325 mg Oral STAT  . cefUROXime  250 mg Oral BID WC  . diazepam  5 mg Oral On  Call  . enoxaparin (LOVENOX) injection  100 mg Subcutaneous Q12H  . enoxaparin (LOVENOX) injection  100 mg Subcutaneous NOW  . fluticasone  2 spray Each Nare Daily  . lisinopril  20 mg Oral Daily  . loratadine  10 mg Oral Daily  . metoprolol tartrate  25 mg Oral BID  . nitroGLYCERIN  0.5 inch Topical Q6H  . simvastatin  20 mg Oral q1800  . sodium chloride  3 mL Intravenous Q12H  . sodium chloride  3 mL Intravenous Q12H   Prescriptions prior to admission  Medication Sig Dispense Refill  . amLODipine (NORVASC) 10 MG tablet Take 10 mg by mouth daily.      Marland Kitchen aspirin 81 MG chewable tablet  Chew 81 mg by mouth daily.      Marland Kitchen lisinopril (PRINIVIL,ZESTRIL) 40 MG tablet Take 20 mg by mouth daily.      . metFORMIN (GLUMETZA) 500 MG (MOD) 24 hr tablet Take 500 mg by mouth daily with breakfast.      . pravastatin (PRAVACHOL) 40 MG tablet Take 20 mg by mouth daily.        Family History  Problem Relation Age of Onset  . Arrhythmia Sister     s/p PPM     History   Social History  . Marital Status: Divorced    Spouse Name: N/A    Number of Children: N/A  . Years of Education: N/A   Occupational History  . Custodian    Social History Main Topics  . Smoking status: Current Every Day Smoker    Types: Cigars  . Smokeless tobacco: Not on file  . Alcohol Use: Yes     Comment: 6-pack of beer/weekend  . Drug Use: No  . Sexually Active: Not on file   Other Topics Concern  . Not on file   Social History Narrative   Veteran. Receives care through the [facility].    Review of Systems: General: positive for flushing, negative for chills, fever, night sweats or weight changes.  Cardiovascular: positive for chest pain, negative for dyspnea on exertion, edema, orthopnea, palpitations, paroxysmal nocturnal dyspnea or shortness of breath Dermatological: negative for rash Respiratory: positive for cough, negative for wheezing Urologic: negative for hematuria Abdominal: negative for nausea, vomiting, diarrhea, bright red blood per rectum, melena, or hematemesis Neurologic: positive for lightheadedness, negative for visual changes, syncope, or dizziness All other systems reviewed and are otherwise negative except as noted above.  Physical Exam: Blood pressure 149/85, pulse 77, temperature 99.2 F (37.3 C), temperature source Oral, resp. rate 18, height 5\' 8"  (1.727 m), weight 103.556 kg (228 lb 4.8 oz), SpO2 96.00%.    General: Well developed, well nourished, in no acute distress. Head: Normocephalic, atraumatic, sclera non-icteric, no xanthomas, nares are without  discharge.  Neck: Negative for carotid bruits. JVD not elevated. Lungs:  Clear bilaterally to auscultation without wheezes, rales, or rhonchi. Breathing is unlabored. Heart: RRR with S1 S2. No murmurs, rubs, or gallops appreciated. Abdomen: Soft, non-tender, non-distended with normoactive bowel sounds. No hepatomegaly. No rebound/guarding. No obvious abdominal masses. Msk:   Strength and tone appears normal for age. Extremities: No clubbing, cyanosis or edema.  Distal pedal pulses are 2+ and equal bilaterally. Neuro:  Alert and oriented X 3. Moves all extremities spontaneously. Psych:  Responds to questions appropriately with a normal affect.  Labs: Recent Labs     12/26/12  1839  WBC  5.3  HGB  14.1  HCT  40.0  MCV  86.8  PLT  147*    Recent Labs Lab 12/26/12 1839  NA 137  K 3.9  CL 101  CO2 25  BUN 16  CREATININE 0.93  CALCIUM 9.1  GLUCOSE 105*   Recent Labs     12/27/12  0110  12/27/12  0605  TROPONINI  <0.30  <0.30   Recent Labs     12/27/12  0110  CHOL  142  HDL  34*  LDLCALC  85  TRIG  116  CHOLHDL  4.2   Radiology/Studies: Dg Chest 2 View  12/26/2012  *RADIOLOGY REPORT*  Clinical Data: Chest pain.  Diabetic, hypertensive.  CHEST - 2 VIEW  Comparison: None.  Findings: Cardiomegaly.  Clear lung fields.  No effusion or pneumothorax.  Bones unremarkable.  IMPRESSION: No active cardiopulmonary disease.   Original Report Authenticated By: Davonna Belling, M.D.    EKG: NSR, 90 bpm, borderline RAD, TWIs III, aVF, no ST changes  ASSESSMENT AND PLAN:   67 y.o. male with PMHx s/f CAD (100% blockage in 1999 s/p angioplasty), tobacco abuse, DM2, HTN and HLD who was admitted to [facility] on 12/26/12 with chest pain.   1. Unstable angina 2. CAD   - H/o of MI in 1999 s/p angioplasty 3. Ongoing tobacco abuse 4. HTN 5. HLD 6. Type 2 DM 7. Sinusitis  Patient has a prior history of CAD requiring PCI (angioplasty alone), ongoing cardiac risk factors and recent  exertional chest discomfort reminiscent of his prior MI. He has had multiple stress tests since this time, the most recent in 2009 which was normal. He does have inferior TWIs on EKG (no prior for comparison). He has ruled out overnight, however is at a high-pretest probability. Would favor proceeding with diagnostic cardiac catheterization. Concern is if the patient's "widowmaker" CAD in the past was in fact a prox LAD lesion, that this may have progressed in the setting of angioplasty alone and ongoing tobacco use. Given inferior TWIs, there may be ischemia affecting the inferior myocardium, however. He is currently on the board for cath, and NPO. Pre-cath orders have been entered.  Continue ASA, ACEi, BB and statin. Stressed tobacco cessation. Further recommendations per interventionalist's findings.      Signed, R. Hurman Horn, PA-C 12/27/2012, 12:59 PM    I have taken a history, reviewed medications, allergies, PMH, SH, FH, and reviewed ROS and examined the patient.  I agree with the assessment and plan. Discussed with patient at length. Cath today radially if possible.  Scotland Dost C. Daleen Squibb, MD, [facility] Pager:  [phone]

## 2012-12-28 ENCOUNTER — Observation Stay (HOSPITAL_COMMUNITY): Payer: Non-veteran care

## 2012-12-28 DIAGNOSIS — R079 Chest pain, unspecified: Secondary | ICD-10-CM

## 2012-12-28 LAB — INFLUENZA PANEL BY PCR (TYPE A & B)
H1N1 flu by pcr: DETECTED — AB
Influenza A By PCR: POSITIVE — AB
Influenza B By PCR: NEGATIVE

## 2012-12-28 LAB — GLUCOSE, CAPILLARY: Glucose-Capillary: 93 mg/dL (ref 70–99)

## 2012-12-28 MED ORDER — PRAVASTATIN SODIUM 20 MG PO TABS
20.0000 mg | ORAL_TABLET | Freq: Every day | ORAL | Status: DC
  Administered 2012-12-28 – 2012-12-30 (×3): 20 mg via ORAL
  Filled 2012-12-28 (×4): qty 1

## 2012-12-28 MED ORDER — ALPRAZOLAM 0.25 MG PO TABS
0.2500 mg | ORAL_TABLET | Freq: Once | ORAL | Status: AC
  Administered 2012-12-28: 0.25 mg via ORAL
  Filled 2012-12-28: qty 1

## 2012-12-28 NOTE — Progress Notes (Signed)
Patient ID: Kenneth [name]  male  ZOX:[id number]    DOB: 09-10-1946    DOA: 12/26/2012  PCP: Default, Provider, MD  Assessment/Plan: Principal Problem:   Unstable angina:typical anginal pain with exertion, high risk factors of prior MI in 1999, hypertension, hyperlipidemia, no recent cardiac workup since 2009. Chest pain currently resolved - 3 sets of cardiac enzymes negative, qw and twi in III, avf, no prior EKG to compare with - 2-D echo: An EF 55-60%, mild LVH, mild to mod left atrial dilation - cardiac cath done yesterday, 3 vessel CAD with a high-grade lesion in the mid to distal RCA and borderline lesion in the mid LAD, PCI on Monday - cont ASA, BB, lisinopril, zocor, on sq heparin, brilinta   Active Problems:   Hypertension:BP stable     Hyperlipidemia: LDL 85 - cont zocor    Unspecified sinusitis (chronic) with fever - placed on flonase, ceftin x 10days, claritin - Blood cultures pending, flu PCR pending   DVT Prophylaxis: Lovenox  Code Status: Full code  Disposition:    Subjective:  Chest pain resolved, No SOB, N/V   Objective: Weight change:   Intake/Output Summary (Last 24 hours) at 12/28/12 1009 Last data filed at 12/28/12 0958  Gross per 24 hour  Intake      3 ml  Output    200 ml  Net   -197 ml   Blood pressure 131/70, pulse 76, temperature 98.2 F (36.8 C), temperature source Oral, resp. rate 18, height 5\' 8"  (1.727 m), weight 103.556 kg (228 lb 4.8 oz), SpO2 98.00%.  Physical Exam: General: Alert and awake, oriented x3, not in any acute distress. CVS: S1-S2 clear, no murmur rubs or gallops Chest: clear to auscultation bilaterally, no wheezing, rales or rhonchi Abdomen: soft NT, ND, NBS Extremities: no c/c/e bilaterally   Lab Results: Basic Metabolic Panel:  Recent Labs Lab 12/26/12 1839 12/27/12 2104  NA 137  --   K 3.9  --   CL 101  --   CO2 25  --   GLUCOSE 105*  --   BUN 16  --   CREATININE 0.93 1.00  CALCIUM 9.1  --     CBC:  Recent Labs Lab 12/26/12 1839 12/27/12 2104  WBC 5.3 4.2  HGB 14.1 13.1  HCT 40.0 36.6*  MCV 86.8 86.5  PLT 147* 125*   Cardiac Enzymes:  Recent Labs Lab 12/27/12 0110 12/27/12 0605 12/27/12 1235  TROPONINI <0.30 <0.30 <0.30   BNP: No components found with this basename: POCBNP,  CBG:  Recent Labs Lab 12/27/12 0735 12/27/12 1132 12/27/12 1732 12/27/12 2109 12/28/12 0740  GLUCAP 119* 97 143* 97 132*     Micro Results: No results found for this or any previous visit (from the past 240 hour(s)).  Studies/Results: Dg Chest 2 View  12/26/2012  *RADIOLOGY REPORT*  Clinical Data: Chest pain.  Diabetic, hypertensive.  CHEST - 2 VIEW  Comparison: None.  Findings: Cardiomegaly.  Clear lung fields.  No effusion or pneumothorax.  Bones unremarkable.  IMPRESSION: No active cardiopulmonary disease.   Original Report Authenticated By: Davonna Belling, M.D.     Medications: Scheduled Meds: . aspirin EC  81 mg Oral Daily  . cefUROXime  250 mg Oral BID WC  . fluticasone  2 spray Each Nare Daily  . heparin  5,000 Units Subcutaneous Q8H  . isosorbide mononitrate  30 mg Oral Daily  . lisinopril  20 mg Oral Daily  . loratadine  10 mg Oral Daily  .  metoprolol tartrate  25 mg Oral BID  . nitroGLYCERIN  0.5 inch Topical Q6H  . simvastatin  20 mg Oral q1800  . sodium chloride  3 mL Intravenous Q12H  . Ticagrelor  90 mg Oral BID      LOS: 2 days   RAI,RIPUDEEP M.D. [facility] 12/28/2012, 10:09 AM Pager: [phone]  If 7PM-7AM, please contact night-coverage [email or web address] Password [email or web address]

## 2012-12-28 NOTE — Progress Notes (Signed)
Utilization review completed.  

## 2012-12-28 NOTE — Progress Notes (Signed)
Patient ID: Kenneth [name], male   DOB: 26-Oct-1946, 67 y.o.   MRN: [id number] Subjective:  Multiple aches and pains. No chest pain. Notes some left lower quadrant abdominal pain, as well as a "funny feeling" in the back of the head. No sob.  Objective:  Vital Signs in the last 24 hours: Temp:  [98.2 F (36.8 C)-100.5 F (38.1 C)] 98.2 F (36.8 C) (03/01 0616) Pulse Rate:  [73-79] 76 (03/01 0959) Resp:  [17-18] 18 (03/01 0616) BP: (116-135)/(55-70) 131/70 mmHg (03/01 0959) SpO2:  [97 %-98 %] 98 % (03/01 0616)  Intake/Output from previous day: 02/28 0701 - 03/01 0700 In: 240 [P.O.:240] Out: 200 [Urine:200] Intake/Output from this shift: Total I/O In: 3 [I.V.:3] Out: -   Physical Exam: Well appearing middle aged man, NAD HEENT: Unremarkable Neck:  No JVD, no thyromegally Lungs:  Clear with no wheezes HEART:  Regular rate rhythm, no murmurs, no rubs, no clicks Abd:  soft, positive bowel sounds, no organomegally, no rebound, no guarding Ext:  2 plus pulses, no edema, no cyanosis, no clubbing Skin:  No rashes no nodules Neuro:  CN II through XII intact, motor grossly intact  Lab Results:  Recent Labs  12/26/12 1839 12/27/12 2104  WBC 5.3 4.2  HGB 14.1 13.1  PLT 147* 125*    Recent Labs  12/26/12 1839 12/27/12 2104  NA 137  --   K 3.9  --   CL 101  --   CO2 25  --   GLUCOSE 105*  --   BUN 16  --   CREATININE 0.93 1.00    Recent Labs  12/27/12 0605 12/27/12 1235  TROPONINI <0.30 <0.30   Hepatic Function Panel No results found for this basename: PROT, ALBUMIN, AST, ALT, ALKPHOS, BILITOT, BILIDIR, IBILI,  in the last 72 hours  Recent Labs  12/27/12 0110  CHOL 142   No results found for this basename: PROTIME,  in the last 72 hours  Imaging: Dg Chest 2 View  12/26/2012  *RADIOLOGY REPORT*  Clinical Data: Chest pain.  Diabetic, hypertensive.  CHEST - 2 VIEW  Comparison: None.  Findings: Cardiomegaly.  Clear lung fields.  No effusion or pneumothorax.   Bones unremarkable.  IMPRESSION: No active cardiopulmonary disease.   Original Report Authenticated By: Davonna Belling, M.D.     Cardiac Studies: Tele - nsr Assessment/Plan:  1. Crescendo angina 2. CAD with a tight RCA 3. HTN 4. Multiple aches and pains 5. ?dyspnea Rec: patient seems anxious and no evidence of an allergic reaction. Will check pulse ox and monitor.  LOS: 2 days    Kelie Gainey,M.D. 12/28/2012, 12:05 PM

## 2012-12-29 DIAGNOSIS — J09X2 Influenza due to identified novel influenza A virus with other respiratory manifestations: Secondary | ICD-10-CM | POA: Diagnosis present

## 2012-12-29 MED ORDER — OSELTAMIVIR PHOSPHATE 75 MG PO CAPS
75.0000 mg | ORAL_CAPSULE | Freq: Two times a day (BID) | ORAL | Status: DC
  Administered 2012-12-29 – 2012-12-31 (×5): 75 mg via ORAL
  Filled 2012-12-29 (×6): qty 1

## 2012-12-29 MED ORDER — ALPRAZOLAM 0.25 MG PO TABS
0.2500 mg | ORAL_TABLET | Freq: Every evening | ORAL | Status: DC | PRN
  Administered 2012-12-29 – 2012-12-30 (×2): 0.25 mg via ORAL
  Filled 2012-12-29 (×2): qty 1

## 2012-12-29 NOTE — Progress Notes (Signed)
Patient ID: Kenneth [name]  male  ZOX:[id number]    DOB: 1946-09-14    DOA: 12/26/2012  PCP: Default, Provider, MD  Assessment/Plan: Principal Problem:   Unstable angina:typical anginal pain with exertion, high risk factors of prior MI in 1999, hypertension, hyperlipidemia, no recent cardiac workup since 2009. Chest pain currently resolved - 3 sets of cardiac enzymes negative, qw and twi in III, avf, no prior EKG to compare with - 2-D echo: An EF 55-60%, mild LVH, mild to mod left atrial dilation - cardiac cath on 2/28-> 3 vessel CAD with a high-grade lesion in the mid to distal RCA and borderline lesion in the mid LAD, PCI on Monday - cont ASA, BB, lisinopril, zocor, on sq heparin, brilinta   Active Problems:  Influenza A positive:  - started on tamiflu, patient works in [employer] school   Hypertension: BP stable     Hyperlipidemia: LDL 85 - cont zocor    Unspecified sinusitis (chronic) with fever: improving - placed on flonase, ceftin x 10days, claritin - Blood cultures negative so far   DVT Prophylaxis: Lovenox  Code Status: Full code  Disposition: cath tomorrow  Subjective:  Chest pain resolved, SOB improving, N/V, resp symptoms improving    Objective: Weight change:  No intake or output data in the 24 hours ending 12/29/12 1052 Blood pressure 151/79, pulse 76, temperature 99.1 F (37.3 C), temperature source Oral, resp. rate 16, height 5\' 8"  (1.727 m), weight 103.556 kg (228 lb 4.8 oz), SpO2 96.00%.  Physical Exam: General: Alert and awake, oriented x3, NAD. CVS: S1-S2 clear, no murmur rubs or gallops Chest: dec BS at bases Abdomen: soft NT, ND, NBS Extremities: no c/c/e bilaterally   Lab Results: Basic Metabolic Panel:  Recent Labs Lab 12/26/12 1839 12/27/12 2104  NA 137  --   K 3.9  --   CL 101  --   CO2 25  --   GLUCOSE 105*  --   BUN 16  --   CREATININE 0.93 1.00  CALCIUM 9.1  --    CBC:  Recent Labs Lab 12/26/12 1839 12/27/12 2104  WBC 5.3  4.2  HGB 14.1 13.1  HCT 40.0 36.6*  MCV 86.8 86.5  PLT 147* 125*   Cardiac Enzymes:  Recent Labs Lab 12/27/12 0110 12/27/12 0605 12/27/12 1235  TROPONINI <0.30 <0.30 <0.30   BNP: No components found with this basename: POCBNP,  CBG:  Recent Labs Lab 12/27/12 1732 12/27/12 2109 12/28/12 0740 12/28/12 1151 12/28/12 1712  GLUCAP 143* 97 132* 92 93     Micro Results: Recent Results (from the past 240 hour(s))  CULTURE, BLOOD (ROUTINE X 2)     Status: None   Collection Time    12/27/12  6:55 PM      Result Value Range Status   Specimen Description BLOOD LEFT HAND   Final   Special Requests BOTTLES DRAWN AEROBIC AND ANAEROBIC 10CC   Final   Culture  Setup Time 12/27/2012 22:54   Final   Culture     Final   Value:        BLOOD CULTURE RECEIVED NO GROWTH TO DATE CULTURE WILL BE HELD FOR 5 DAYS BEFORE ISSUING A FINAL NEGATIVE REPORT   Report Status PENDING   Incomplete    Studies/Results: Dg Chest 2 View  12/26/2012  *RADIOLOGY REPORT*  Clinical Data: Chest pain.  Diabetic, hypertensive.  CHEST - 2 VIEW  Comparison: None.  Findings: Cardiomegaly.  Clear lung fields.  No effusion or pneumothorax.  Bones unremarkable.  IMPRESSION: No active cardiopulmonary disease.   Original Report Authenticated By: Davonna Belling, M.D.     Medications: Scheduled Meds: . aspirin EC  81 mg Oral Daily  . cefUROXime  250 mg Oral BID WC  . fluticasone  2 spray Each Nare Daily  . heparin  5,000 Units Subcutaneous Q8H  . isosorbide mononitrate  30 mg Oral Daily  . lisinopril  20 mg Oral Daily  . loratadine  10 mg Oral Daily  . metoprolol tartrate  25 mg Oral BID  . nitroGLYCERIN  0.5 inch Topical Q6H  . oseltamivir  75 mg Oral BID  . pravastatin  20 mg Oral q1800  . sodium chloride  3 mL Intravenous Q12H  . Ticagrelor  90 mg Oral BID      LOS: 3 days   RAI,RIPUDEEP M.D. [facility] 12/29/2012, 10:52 AM Pager: [phone]  If 7PM-7AM, please contact  night-coverage [email or web address] Password [email or web address]

## 2012-12-29 NOTE — Progress Notes (Signed)
Patient ID: Kenneth [name], male   DOB: 11/06/1945, 67 y.o.   MRN: [id number] Subjective:  Multiple aches and pains from yesterday are resolved.  Objective:  Vital Signs in the last 24 hours: Temp:  [98.5 F (36.9 C)-99.1 F (37.3 C)] 99.1 F (37.3 C) (03/01 2100) Pulse Rate:  [70-76] 76 (03/02 0932) Resp:  [16-19] 16 (03/01 2100) BP: (111-151)/(60-84) 151/79 mmHg (03/02 0932) SpO2:  [96 %-98 %] 96 % (03/01 2100)  Intake/Output from previous day: 03/01 0701 - 03/02 0700 In: 3 [I.V.:3] Out: -  Intake/Output from this shift:    Physical Exam: Well appearing NAD HEENT: Unremarkable Neck:  No JVD, no thyromegally Lungs:  Clear HEART:  Regular rate rhythm, no murmurs, no rubs, no clicks Abd:  Flat, positive bowel sounds, no organomegally, no rebound, no guarding Ext:  2 plus pulses, no edema, no cyanosis, no clubbing Skin:  No rashes no nodules Neuro:  CN II through XII intact, motor grossly intact  Lab Results:  Recent Labs  12/26/12 1839 12/27/12 2104  WBC 5.3 4.2  HGB 14.1 13.1  PLT 147* 125*    Recent Labs  12/26/12 1839 12/27/12 2104  NA 137  --   K 3.9  --   CL 101  --   CO2 25  --   GLUCOSE 105*  --   BUN 16  --   CREATININE 0.93 1.00    Recent Labs  12/27/12 0605 12/27/12 1235  TROPONINI <0.30 <0.30   Hepatic Function Panel No results found for this basename: PROT, ALBUMIN, AST, ALT, ALKPHOS, BILITOT, BILIDIR, IBILI,  in the last 72 hours  Recent Labs  12/27/12 0110  CHOL 142   No results found for this basename: PROTIME,  in the last 72 hours  Imaging: Dg Chest Port 1 View  12/28/2012  *RADIOLOGY REPORT*  Clinical Data: Intermittent left-sided chest pain and pressure  PORTABLE CHEST - 1 VIEW  Comparison: 12/26/2012  Findings:  Apparent enlargement of the cardiac silhouette may be accentuated due to decreased lung volumes and AP projection.  Mild cephalization of flow without frank evidence of edema.  Minimal bibasilar opacities favored to  represent atelectasis.  No focal airspace opacities.  No definite pleural effusion, though note, the bilateral costophrenic angles are excluded from view.  No pneumothorax.  Unchanged bones.  IMPRESSION: 1.  Apparent enlargement of the cardiac silhouette may be accentuated due to the AP projection, though note, a pericardial effusion may have a similar appearance.  Further evaluation with cardiac echo may be performed as clinically indicated.  2. Pulmonary venous congestion without frank evidence of edema.   Original Report Authenticated By: Tacey Ruiz, MD     Cardiac Studies: Tele - nsr Assessment/Plan:  1. Chest pain 2. Tight RCA stenosis Rec: for PCI tomorrow.   LOS: 3 days    Gregg Taylor,M.D. 12/29/2012, 10:53 AM

## 2012-12-30 ENCOUNTER — Encounter (HOSPITAL_COMMUNITY)
Admission: EM | Disposition: A | Discharge status: Home / Self Care | Payer: Self-pay | Source: Home / Self Care | Attending: Internal Medicine

## 2012-12-30 DIAGNOSIS — I2510 Atherosclerotic heart disease of native coronary artery without angina pectoris: Secondary | ICD-10-CM

## 2012-12-30 HISTORY — PX: PERCUTANEOUS CORONARY STENT INTERVENTION (PCI-S): SHX5485

## 2012-12-30 LAB — CBC
HCT: 40.7 % (ref 39.0–52.0)
Hemoglobin: 14 g/dL (ref 13.0–17.0)
MCHC: 34.4 g/dL (ref 30.0–36.0)
RBC: 4.71 MIL/uL (ref 4.22–5.81)
WBC: 3.7 10*3/uL — ABNORMAL LOW (ref 4.0–10.5)

## 2012-12-30 LAB — BASIC METABOLIC PANEL
CO2: 26 mEq/L (ref 19–32)
Chloride: 103 mEq/L (ref 96–112)
GFR calc non Af Amer: 89 mL/min — ABNORMAL LOW (ref >90)
Glucose, Bld: 106 mg/dL — ABNORMAL HIGH (ref 70–99)
Potassium: 3.4 mEq/L — ABNORMAL LOW (ref 3.5–5.1)
Sodium: 139 mEq/L (ref 135–145)

## 2012-12-30 SURGERY — PERCUTANEOUS CORONARY STENT INTERVENTION (PCI-S)
Anesthesia: LOCAL

## 2012-12-30 MED ORDER — SODIUM CHLORIDE 0.9 % IV SOLN
1.7500 mg/kg/h | INTRAVENOUS | Status: DC
  Filled 2012-12-30: qty 250

## 2012-12-30 MED ORDER — VERAPAMIL HCL 2.5 MG/ML IV SOLN
INTRAVENOUS | Status: AC
  Filled 2012-12-30: qty 2

## 2012-12-30 MED ORDER — MIDAZOLAM HCL 2 MG/2ML IJ SOLN
INTRAMUSCULAR | Status: AC
  Filled 2012-12-30: qty 2

## 2012-12-30 MED ORDER — FENTANYL CITRATE 0.05 MG/ML IJ SOLN
INTRAMUSCULAR | Status: AC
  Filled 2012-12-30: qty 2

## 2012-12-30 MED ORDER — BIVALIRUDIN 250 MG IV SOLR
INTRAVENOUS | Status: AC
  Filled 2012-12-30: qty 250

## 2012-12-30 MED ORDER — NITROGLYCERIN 1 MG/10 ML FOR IR/CATH LAB
INTRA_ARTERIAL | Status: AC
  Filled 2012-12-30: qty 10

## 2012-12-30 MED ORDER — ADENOSINE 12 MG/4ML IV SOLN
16.0000 mL | Freq: Once | INTRAVENOUS | Status: DC
  Filled 2012-12-30: qty 16

## 2012-12-30 MED ORDER — SODIUM CHLORIDE 0.9 % IV SOLN
1.0000 mL/kg/h | INTRAVENOUS | Status: AC
  Administered 2012-12-30: 1 mL/kg/h via INTRAVENOUS

## 2012-12-30 MED ORDER — POTASSIUM CHLORIDE CRYS ER 20 MEQ PO TBCR
EXTENDED_RELEASE_TABLET | ORAL | Status: AC
  Filled 2012-12-30: qty 2

## 2012-12-30 MED ORDER — POTASSIUM CHLORIDE CRYS ER 20 MEQ PO TBCR
40.0000 meq | EXTENDED_RELEASE_TABLET | Freq: Once | ORAL | Status: AC
  Administered 2012-12-30: 40 meq via ORAL

## 2012-12-30 MED ORDER — SODIUM CHLORIDE 0.9 % IV SOLN
0.1500 mg/kg/h | INTRAVENOUS | Status: DC
  Filled 2012-12-30: qty 250

## 2012-12-30 NOTE — Interval H&P Note (Signed)
History and Physical Interval Note:  12/30/2012 9:40 AM  Kenneth [name]  has presented today for surgery, with the diagnosis of CAD  The various methods of treatment have been discussed with the patient and family. After consideration of risks, benefits and other options for treatment, the patient has consented to  Procedure(s): PERCUTANEOUS CORONARY STENT INTERVENTION (PCI-S) (N/A) as a surgical intervention .  The patient's history has been reviewed, patient examined, no change in status, stable for surgery.  I have reviewed the patient's chart and labs.  Questions were answered to the patient's satisfaction.     Theron Arista [facility] 12/30/2012 9:40 AM

## 2012-12-30 NOTE — CV Procedure (Signed)
CARDIAC CATH NOTE  Name: Kenneth [name] MRN: [id number] DOB: 01-22-1946  Procedure: PTCA and stenting of the RCA  Indication: 67 year old white male with history of hypertension hyperlipidemia who presents with symptoms of unstable angina. Cardiac catheterization demonstrated severe complex disease of the right coronary with 80-90% stenosis in the mid RCA and 70-80% stenosis in the distal right coronary. He had inferior wall hypokinesis by cardiac catheterization. The right coronary was moderately calcified throughout. It was tortuous. Patient returned for PCI the right coronary. He also had an intermediate stenosis in the mid LAD and we planned flow wire analysis of this vessel.  Procedural Details: The right wrist was prepped, draped, and anesthetized with 1% lidocaine. Using the modified Seldinger technique, a 6 Fr sheath was introduced into the radial artery. 3 mg verapamil was administered through the radial sheath. Weight-based bivalirudin was given for anticoagulation. Once a therapeutic ACT was achieved, a 6 [place] XB RCA guide catheter was inserted.  An Asahi medium coronary guidewire was used to cross the lesion.  The lesion was predilated with a 2.5 mm balloon. Because of the tortuosity and calcification of the vessel it was difficult to track the balloon down the artery. We next placed a guidewire catheter through the sheath. We were unable to pass this  around the primary bend in the vessel. We attempted to advance this over a balloon at low inflation without success. We next attempted to deploy a stent in the right coronary but were unable to pass it around the primary bend in the vessel. We next placed a 3.0 mm noncompliant balloon in the distal vessel and inflated in the distal vessel and at the crux. Using this distal balloon as an anchor we were able to pass the guideliner to the level of the crux. We then were able to pass a stent into the distal RCA. The distal vessel was stented with  a 3.0 x 38 mm Promus premier stent. We then stented the mid RCA with an overlapping 3.0 x 32 mm Promus premier stent. To cover the more proximal portion of the lesion around the primary bend we used a third 3.0 x 24 mm Promus premier stent. The stent was postdilated with a 3.25 mm noncompliant balloon to 16 atmospheres.  Following PCI, there was 0% residual stenosis and TIMI-3 flow. Final angiography confirmed an excellent result.   We then proceeded with flow wire analysis of the mid LAD. An XB LAD 3.5 guide was used. A wave wire was passed with difficulty into the mid LAD. Vasodilator stress was performed with intravenous adenosine. A fractional flow reserve of 0.75 was obtained indicating a hemodynamically significant lesion. It was clear from our attempt to pass the wire that this would be a difficult procedure. We had poor guide support. A left Amplatz guide was also used but were unable to engage the ostium with this catheter. At this point we had 44 minutes of fluoroscopy time and had used 225 cc of contrast. Rather than proceeding with what appeared to be a difficult intervention we elected to stop at this point and consider bringing the patient back for intervention of the LAD at a later date. The patient tolerated the procedure well. There were no immediate procedural complications. A TR band was used for radial hemostasis. The patient was transferred to the post catheterization recovery area for further monitoring.  Lesion Data: Vessel: RCA Percent stenosis (pre): 70-80% diffuse TIMI-flow (pre):  3 Stent:  3.0 x 38 mm, 3.0 x  32 mm, and 3.0 x 24 mm Promus premier stents overlapping Percent stenosis (post): 0% TIMI-flow (post): 3  Conclusions:  1. Successful stenting of the proximal to distal RCA using overlapping drug-eluting stents. 2. Abnormal FFR of the LAD  Recommendations: Continue dual antiplatelet therapy. I would consider discharging the patient in the morning and bringing him back  for intervention of the LAD in 1 to 2 weeks.  Theron Arista [facility] 12/30/2012, 12:06 PM

## 2012-12-30 NOTE — Progress Notes (Signed)
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 Will d/w Case manager  Subjective:  Chest pain resolved, awaiting cath, son at the bedside   Objective: Weight change:  No intake or output data in the 24 hours ending 12/30/12 1145 Blood pressure 145/76, pulse 70, temperature 98.4 F (36.9 C), temperature source Oral, resp. rate 18, height 5\' 8"  (1.727 m), weight 102 kg (224 lb 13.9 oz), SpO2 97.00%.  Physical Exam: General: Alert and awake, oriented x3, NAD. CVS: S1-S2 clear, no murmur rubs or gallops Chest: dec BS at bases Abdomen: soft NT, ND, NBS Extremities: no c/c/e bilaterally   Lab Results: Basic Metabolic Panel:  Recent Labs Lab 12/26/12 1839 12/27/12 2104 12/30/12 0640  NA 137  --  139  K 3.9  --  3.4*  CL 101  --  103  CO2 25  --  26  GLUCOSE 105*  --  106*  BUN 16  --  11   CREATININE 0.93 1.00 0.88  CALCIUM 9.1  --  9.0   CBC:  Recent Labs Lab 12/27/12 2104 12/30/12 0640  WBC 4.2 3.7*  HGB 13.1 14.0  HCT 36.6* 40.7  MCV 86.5 86.4  PLT 125* 144*   Cardiac Enzymes:  Recent Labs Lab 12/27/12 0110 12/27/12 0605 12/27/12 1235  TROPONINI <0.30 <0.30 <0.30   BNP: No components found with this basename: POCBNP,  CBG:  Recent Labs Lab 12/27/12 1732 12/27/12 2109 12/28/12 0740 12/28/12 1151 12/28/12 1712  GLUCAP 143* 97 132* 92 93     Micro Results: Recent Results (from the past 240 hour(s))  CULTURE, BLOOD (ROUTINE X 2)     Status: None   Collection Time    12/27/12  6:55 PM      Result Value Range Status   Specimen Description BLOOD LEFT HAND   Final   Special Requests BOTTLES DRAWN AEROBIC AND ANAEROBIC 10CC   Final   Culture  Setup Time 12/27/2012 22:54   Final   Culture     Final   Value:        BLOOD CULTURE RECEIVED NO GROWTH TO DATE CULTURE WILL BE HELD FOR 5 DAYS BEFORE ISSUING A FINAL NEGATIVE REPORT   Report Status PENDING   Incomplete  CULTURE, BLOOD (ROUTINE X 2)     Status: None   Collection Time  12/27/12  7:28 PM      Result Value Range Status   Specimen Description BLOOD HAND LEFT   Final   Special Requests BOTTLES DRAWN AEROBIC AND ANAEROBIC 10CC   Final   Culture  Setup Time 12/28/2012 00:48   Final   Culture     Final   Value:        BLOOD CULTURE RECEIVED NO GROWTH TO DATE CULTURE WILL BE HELD FOR 5 DAYS BEFORE ISSUING A FINAL NEGATIVE REPORT   Report Status PENDING   Incomplete    Studies/Results: Dg Chest 2 View  12/26/2012  *RADIOLOGY REPORT*  Clinical Data: Chest pain.  Diabetic, hypertensive.  CHEST - 2 VIEW  Comparison: None.  Findings: Cardiomegaly.  Clear lung fields.  No effusion or pneumothorax.  Bones unremarkable.  IMPRESSION: No active cardiopulmonary disease.   Original Report Authenticated By: Davonna Belling, M.D.     Medications: Scheduled Meds: . adenosine  16 mL Intravenous Once  .  [facility] HOLD] aspirin EC  81 mg Oral Daily  . bivalirudin (ANGIOMAX) infusion 5 mg/mL (Cath Lab,ACS,PCI indication)  1.75 mg/kg/hr Intravenous To Cath  . [facility] HOLD] cefUROXime  250 mg Oral BID WC  . [[facility] HOLD] fluticasone  2 spray Each Nare Daily  . heparin  5,000 Units Subcutaneous Q8H  . [[facility] HOLD] isosorbide mononitrate  30 mg Oral Daily  . [[facility] HOLD] lisinopril  20 mg Oral Daily  . [facility] HOLD] loratadine  10 mg Oral Daily  . [facility] HOLD] metoprolol tartrate  25 mg Oral BID  . [facility] HOLD] nitroGLYCERIN  0.5 inch Topical Q6H  . [facility] HOLD] oseltamivir  75 mg Oral BID  . [facility] HOLD] pravastatin  20 mg Oral q1800  . [MAR HOLD] sodium chloride  3 mL Intravenous Q12H  . Ticagrelor  90 mg Oral BID      LOS: 4 days   RAI,RIPUDEEP M.D. [facility] 12/30/2012, 11:45 AM Pager: [phone]  If 7PM-7AM, please contact night-coverage [email or web address] Password [email or web address]

## 2012-12-30 NOTE — H&P (View-Only) (Signed)
Patient ID: Kenneth [name], male   DOB: 05/29/1946, 66 y.o.   MRN: [id number] Subjective:  Multiple aches and pains from yesterday are resolved.  Objective:  Vital Signs in the last 24 hours: Temp:  [98.5 F (36.9 C)-99.1 F (37.3 C)] 99.1 F (37.3 C) (03/01 2100) Pulse Rate:  [70-76] 76 (03/02 0932) Resp:  [16-19] 16 (03/01 2100) BP: (111-151)/(60-84) 151/79 mmHg (03/02 0932) SpO2:  [96 %-98 %] 96 % (03/01 2100)  Intake/Output from previous day: 03/01 0701 - 03/02 0700 In: 3 [I.V.:3] Out: -  Intake/Output from this shift:    Physical Exam: Well appearing NAD HEENT: Unremarkable Neck:  No JVD, no thyromegally Lungs:  Clear HEART:  Regular rate rhythm, no murmurs, no rubs, no clicks Abd:  Flat, positive bowel sounds, no organomegally, no rebound, no guarding Ext:  2 plus pulses, no edema, no cyanosis, no clubbing Skin:  No rashes no nodules Neuro:  CN II through XII intact, motor grossly intact  Lab Results:  Recent Labs  12/26/12 1839 12/27/12 2104  WBC 5.3 4.2  HGB 14.1 13.1  PLT 147* 125*    Recent Labs  12/26/12 1839 12/27/12 2104  NA 137  --   K 3.9  --   CL 101  --   CO2 25  --   GLUCOSE 105*  --   BUN 16  --   CREATININE 0.93 1.00    Recent Labs  12/27/12 0605 12/27/12 1235  TROPONINI <0.30 <0.30   Hepatic Function Panel No results found for this basename: PROT, ALBUMIN, AST, ALT, ALKPHOS, BILITOT, BILIDIR, IBILI,  in the last 72 hours  Recent Labs  12/27/12 0110  CHOL 142   No results found for this basename: PROTIME,  in the last 72 hours  Imaging: Dg Chest Port 1 View  12/28/2012  *RADIOLOGY REPORT*  Clinical Data: Intermittent left-sided chest pain and pressure  PORTABLE CHEST - 1 VIEW  Comparison: 12/26/2012  Findings:  Apparent enlargement of the cardiac silhouette may be accentuated due to decreased lung volumes and AP projection.  Mild cephalization of flow without frank evidence of edema.  Minimal bibasilar opacities favored to  represent atelectasis.  No focal airspace opacities.  No definite pleural effusion, though note, the bilateral costophrenic angles are excluded from view.  No pneumothorax.  Unchanged bones.  IMPRESSION: 1.  Apparent enlargement of the cardiac silhouette may be accentuated due to the AP projection, though note, a pericardial effusion may have a similar appearance.  Further evaluation with cardiac echo may be performed as clinically indicated.  2. Pulmonary venous congestion without frank evidence of edema.   Original Report Authenticated By: John Watts V, MD     Cardiac Studies: Tele - nsr Assessment/Plan:  1. Chest pain 2. Tight RCA stenosis Rec: for PCI tomorrow.   LOS: 3 days    Gregg Taylor,M.D. 12/29/2012, 10:53 AM    

## 2012-12-31 ENCOUNTER — Other Ambulatory Visit: Payer: Self-pay | Admitting: Cardiology

## 2012-12-31 DIAGNOSIS — I25810 Atherosclerosis of coronary artery bypass graft(s) without angina pectoris: Secondary | ICD-10-CM

## 2012-12-31 LAB — BASIC METABOLIC PANEL
CO2: 23 mEq/L (ref 19–32)
Calcium: 8.6 mg/dL (ref 8.4–10.5)
Creatinine, Ser: 0.9 mg/dL (ref 0.50–1.35)
Glucose, Bld: 96 mg/dL (ref 70–99)

## 2012-12-31 LAB — CBC
HCT: 34.6 % — ABNORMAL LOW (ref 39.0–52.0)
Hemoglobin: 12.1 g/dL — ABNORMAL LOW (ref 13.0–17.0)
MCH: 30.2 pg (ref 26.0–34.0)
MCV: 86.3 fL (ref 78.0–100.0)
RBC: 4.01 MIL/uL — ABNORMAL LOW (ref 4.22–5.81)

## 2012-12-31 LAB — GLUCOSE, CAPILLARY: Glucose-Capillary: 104 mg/dL — ABNORMAL HIGH (ref 70–99)

## 2012-12-31 MED ORDER — LISINOPRIL 40 MG PO TABS: 20.0000 mg | ORAL_TABLET | Freq: Every day | ORAL | Status: DC

## 2012-12-31 MED ORDER — TICAGRELOR 90 MG PO TABS: 90.0000 mg | ORAL_TABLET | Freq: Two times a day (BID) | ORAL | Status: DC

## 2012-12-31 MED ORDER — ALPRAZOLAM 0.25 MG PO TABS: 0.2500 mg | ORAL_TABLET | Freq: Every evening | ORAL | Status: DC | PRN

## 2012-12-31 MED ORDER — OSELTAMIVIR PHOSPHATE 75 MG PO CAPS: 75.0000 mg | ORAL_CAPSULE | Freq: Two times a day (BID) | ORAL | Status: DC

## 2012-12-31 MED ORDER — METOPROLOL TARTRATE 25 MG PO TABS: 25.0000 mg | ORAL_TABLET | Freq: Two times a day (BID) | ORAL | Status: DC

## 2012-12-31 MED ORDER — LORATADINE 10 MG PO TABS: 10.0000 mg | ORAL_TABLET | Freq: Every day | ORAL | Status: DC

## 2012-12-31 MED ORDER — NITROGLYCERIN 0.4 MG SL SUBL: 0.4000 mg | SUBLINGUAL_TABLET | SUBLINGUAL | Status: DC | PRN

## 2012-12-31 MED ORDER — CEFUROXIME AXETIL 250 MG PO TABS: 250.0000 mg | ORAL_TABLET | Freq: Two times a day (BID) | ORAL | Status: DC

## 2012-12-31 MED ORDER — AMLODIPINE BESYLATE 10 MG PO TABS
10.0000 mg | ORAL_TABLET | Freq: Every day | ORAL | Status: DC
  Administered 2012-12-31: 09:00:00 10 mg via ORAL
  Filled 2012-12-31: qty 1

## 2012-12-31 MED ORDER — PRAVASTATIN SODIUM 40 MG PO TABS: 20.0000 mg | ORAL_TABLET | Freq: Every day | ORAL | Status: DC

## 2012-12-31 MED ORDER — AMLODIPINE BESYLATE 10 MG PO TABS: 10.0000 mg | ORAL_TABLET | Freq: Every day | ORAL | Status: DC

## 2012-12-31 MED ORDER — FLUTICASONE PROPIONATE 50 MCG/ACT NA SUSP: 2.0000 | Freq: Every day | NASAL | Status: DC

## 2012-12-31 MED FILL — Dextrose Inj 5%: INTRAVENOUS | Qty: 50 | Status: AC

## 2012-12-31 NOTE — Progress Notes (Addendum)
CARDIAC REHAB PHASE I   PRE:  Rate/Rhythm: 7  BP:  Supine:   Sitting: 161/81  Standing:    SaO2: 99 ra  MODE:  Ambulation: 1000 ft   POST:  Rate/Rhythem: 91 sr  BP:  Supine:   Sitting: 164/91  Standing:    SaO2:  0745-0910 Pt tolerated ambulation well without c/o of cp or SOB. He did c/o of knees being stiff from laying around. BP up before and after walk. Completed stent education with pt. He voices understanding. Pt agrees to referral to Outpt. CRP in [place], will send referral.Did discussed smoking cessation with pt, he states that it is only a occasional cigar and feels that he will be ale to stop. Gave him tips for quitting and coaching contact number.  Melina Copa G 3 sr

## 2012-12-31 NOTE — Discharge Summary (Signed)
Physician Discharge Summary  Patient ID: Kenneth [name] MRN: [id number] DOB/AGE: Oct 23, 1946 67 y.o.  Admit date: 12/26/2012 Discharge date: 12/31/2012  [facility]  Discharge Diagnoses:    . Unstable angina/ 3VCAD: Cath 2/28 showed 3v CAD w/ high-grade lesion in mid/distal RCA and borderline lesion in mid LAD, s/p 3 overlapping DES to RCA on 12/30/12. Staged PCI on 3/18. Marland Kitchen Hyperlipidemia . Hypertension . Unspecified sinusitis (chronic) . Influenza A positive  Consults:  Cardiology, Dr [place]   Discharge Medications:   Medication List    TAKE these medications       ALPRAZolam 0.25 MG tablet  Commonly known as:  XANAX  Take 1 tablet (0.25 mg total) by mouth at bedtime as needed for anxiety or sleep.     amLODipine 10 MG tablet  Commonly known as:  NORVASC  Take 1 tablet (10 mg total) by mouth daily.     aspirin 81 MG chewable tablet  Chew 81 mg by mouth daily.     cefUROXime 250 MG tablet  Commonly known as:  CEFTIN  Take 1 tablet (250 mg total) by mouth 2 (two) times daily with a meal. X 5 more days     fluticasone 50 MCG/ACT nasal spray  Commonly known as:  FLONASE  Place 2 sprays into the nose daily.     lisinopril 40 MG tablet  Commonly known as:  PRINIVIL,ZESTRIL  Take 0.5 tablets (20 mg total) by mouth daily.     loratadine 10 MG tablet  Commonly known as:  CLARITIN  Take 1 tablet (10 mg total) by mouth daily.     metFORMIN 500 MG (MOD) 24 hr tablet  Commonly known as:  GLUMETZA  Take 500 mg by mouth daily with breakfast.     metoprolol tartrate 25 MG tablet  Commonly known as:  LOPRESSOR  Take 1 tablet (25 mg total) by mouth 2 (two) times daily.     nitroGLYCERIN 0.4 MG SL tablet  Commonly known as:  NITROSTAT  Place 1 tablet (0.4 mg total) under the tongue every 5 (five) minutes x 3 doses as needed for chest pain.     oseltamivir 75 MG capsule  Commonly known as:  TAMIFLU  Take 1 capsule (75 mg total) by mouth 2 (two) times  daily. X 3 more days     pravastatin 40 MG tablet  Commonly known as:  PRAVACHOL  Take 0.5 tablets (20 mg total) by mouth daily.     Ticagrelor 90 MG Tabs tablet  Commonly known as:  BRILINTA  Take 1 tablet (90 mg total) by mouth 2 (two) times daily.         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 Felt similar to his heart attack in 1999. [facility]. Has had several stress testing since then, assumes they were normal, no cath since 1999. He took some asa with some relief of the pain. However his pain was not fully relieved until he received ntg sl in the ED.   Hospital Course:  Unstable  angina/3V CAD: High risk factors of prior MI in 1999, hypertension, hyperlipidemia, no recent cardiac workup since 2009. Patient was admitted, 3 sets of cardiac enzymes negative however had qw and twi in III, avf. 2D Echo was done which showed EF 55-60%, mild LVH, mild to mod left atrial dilation. Cardiology was consulted, cardiac cath on 2/28 showed 3vessel CAD w/ high-grade lesion in mid/distal RCA and borderline lesion in mid LAD, s/p 3 overlapping DES to RCA on 12/30/12. EF was 40% by cath and 55-60% by 2-D echo. Patient will continue aspirin, Brilinta, beta blocker, ACE inhibitor, statin PRN nitroglycerin. Case manager consult was placed in obtaining medications until he can get it from [place]. Patient will return back on March 18 for reassessment of the LAD disease and possible PCI. Discharge instructions were provided by cardiology.   Influenza A positive: with sinusitis during hospitalisation, patient was noted to be spiking fevers with URI  symptoms. Influenza panel showed fluA/HIN1 positive, patient was started on tamiflu and ceftin for acute sinuisitis. Also patient works in [employer] school.   Hypertension: BP stable   Hyperlipidemia: LDL 85, - cont statin   Diabetes Mellitus: HbA1C 6.2, patient will restart meformin tomorrow  Day of Discharge BP 131/81  Pulse 73  Temp(Src) 98.1 F (36.7 C) (Oral)  Resp 20  Ht 5\' 8"  (1.727 m)  Wt 98.1 kg (216 lb 4.3 oz)  BMI 32.89 kg/m2  SpO2 98%  Physical Exam: General: Alert and awake oriented x3 not in any acute distress. HEENT: anicteric sclera, pupils reactive to light and accommodation CVS: S1-S2 clear no murmur rubs or gallops Chest: clear to auscultation bilaterally, no wheezing rales or rhonchi Abdomen: soft nontender, nondistended, normal bowel sounds, no organomegaly Extremities: no cyanosis, clubbing or edema noted bilaterally Neuro: Cranial nerves II-XII intact, no focal neurological deficits   The results of significant diagnostics from this hospitalization (including imaging, microbiology, ancillary and laboratory) are listed below for reference.    LAB RESULTS: Basic Metabolic Panel:  Recent Labs Lab 12/30/12 0640 12/31/12 0440  NA 139 136  K 3.4* 3.7  CL 103 103  CO2 26 23  GLUCOSE 106* 96  BUN 11 13  CREATININE 0.88 0.90  CALCIUM 9.0 8.6   CBC:  Recent Labs Lab 12/30/12 0640 12/31/12 0440  WBC 3.7* 4.2  HGB 14.0 12.1*  HCT 40.7 34.6*  MCV 86.4 86.3  PLT 144* 139*   Cardiac Enzymes:  Recent Labs Lab 12/27/12 0605 12/27/12 1235  TROPONINI <0.30 <0.30   BNP: No components found with this basename: POCBNP,  CBG:  Recent Labs Lab 12/30/12 1811 12/31/12 0805  GLUCAP 90 104*    Significant Diagnostic Studies:  Dg Chest 2 View  12/26/2012  *RADIOLOGY REPORT*  Clinical Data: Chest pain.  Diabetic, hypertensive.  CHEST - 2 VIEW  Comparison: None.  Findings: Cardiomegaly.  Clear lung fields.  No effusion or pneumothorax.  Bones  unremarkable.  IMPRESSION: No active cardiopulmonary disease.   Original Report Authenticated By: Davonna Belling, M.D.     2D ECHO:   Disposition and Follow-up:     Discharge Orders   Future Orders Complete By Expires     Amb Referral to Cardiac Rehabilitation  As directed     Diet - low sodium heart healthy  As directed     Diet Carb Modified  As directed     Increase activity slowly  As directed         DISPOSITION: home  DIET: carb modified ACTIVITY: as  tolerated   DISCHARGE FOLLOW-UP Follow-up Information   Follow up with [facility] On 01/14/2013. (Please arrive at the short stay unit at 7:00 am for your 9:00 heart catheterization. )    Contact information:   Short Stay Unit [address] [place] [address] [phone]      Time spent on Discharge: 37 mins  Signed:   RAI,RIPUDEEP M.D. [facility] 12/31/2012, 10:49 AM Pager: [phone]

## 2012-12-31 NOTE — Progress Notes (Signed)
Cardiology Progress Note Patient Name: Kenneth [name] Date of Encounter: 12/31/2012, 6:23 AM     Subjective  No complaints of chest pain or SOB. Slept well.    Objective   Telemetry: Sinus rhythm 60-70s  Medications: . aspirin EC  81 mg Oral Daily  . cefUROXime  250 mg Oral BID WC  . fluticasone  2 spray Each Nare Daily  . isosorbide mononitrate  30 mg Oral Daily  . lisinopril  20 mg Oral Daily  . loratadine  10 mg Oral Daily  . metoprolol tartrate  25 mg Oral BID  . oseltamivir  75 mg Oral BID  . pravastatin  20 mg Oral q1800  . sodium chloride  3 mL Intravenous Q12H  . Ticagrelor  90 mg Oral BID      Physical Exam:  Temp:  [97.5 F (36.4 C)-98.3 F (36.8 C)] 98.1 F (36.7 C) (03/04 0609) Pulse Rate:  [60-80] 67 (03/04 0609) BP: (113-193)/(70-126) 153/82 mmHg (03/04 0609) SpO2:  [96 %-100 %] 99 % (03/04 0609) Weight:  [216 lb 4.3 oz (98.1 kg)] 216 lb 4.3 oz (98.1 kg) (03/04 0609)  General: Overweight elderly male, in no acute distress. Head: Normocephalic, atraumatic, sclera non-icteric, nares are without discharge.  Neck: Supple. Negative for carotid bruits or JVD Lungs: Clear bilaterally to auscultation without wheezes, rales, or rhonchi. Breathing is unlabored. Heart: RRR S1 S2 without murmurs, rubs, or gallops.  Abdomen: Soft, non-tender, non-distended with normoactive bowel sounds. No rebound/guarding. No obvious abdominal masses. Msk:  Strength and tone appear normal for age. Extremities: No edema. No clubbing or cyanosis. Distal pedal pulses are intact and equal bilaterally. No radial hematoma. Neuro: Alert and oriented X 3. Moves all extremities spontaneously. Psych:  Responds to questions appropriately with a normal affect.   Intake/Output Summary (Last 24 hours) at 12/31/12 0623 Last data filed at 12/30/12 2100  Gross per 24 hour  Intake   1056 ml  Output      0 ml  Net   1056 ml    Labs:   12/30/12 0640 12/31/12 0440  NA 139 136  K 3.4*  3.7  CL 103 103  CO2 26 23  GLUCOSE 106* 96  BUN 11 13  CREATININE 0.88 0.90  CALCIUM 9.0 8.6    12/30/12 0640 12/31/12 0440  WBC 3.7* 4.2  HGB 14.0 12.1*  HCT 40.7 34.6*  MCV 86.4 86.3  PLT 144* 139*   Radiology/Studies:   12/26/2012  -  CHEST - 2 VIEW  Findings: Cardiomegaly.  Clear lung fields.  No effusion or pneumothorax.  Bones unremarkable.  IMPRESSION: No active cardiopulmonary disease.    12/28/2012- PORTABLE CHEST - 1 VIEW  Findings:  Apparent enlargement of the cardiac silhouette may be accentuated due to decreased lung volumes and AP projection.  Mild cephalization of flow without frank evidence of edema.  Minimal bibasilar opacities favored to represent atelectasis.  No focal airspace opacities.  No definite pleural effusion, though note, the bilateral costophrenic angles are excluded from view.  No pneumothorax.  Unchanged bones.  IMPRESSION: 1.  Apparent enlargement of the cardiac silhouette may be accentuated due to the AP projection, though note, a pericardial effusion may have a similar appearance.  Further evaluation with cardiac echo may be performed as clinically indicated.  2. Pulmonary venous congestion without frank evidence of edema.      12/27/12 - Echo Study Conclusions: - Left ventricle: The cavity size was normal. Wall thickness was  increased in a pattern of mild LVH. Systolic function was normal. The estimated ejection fraction was in the range of 55% to 60%. - Left atrium: The atrium was mildly to moderately dilated.  12/27/12 - Cardiac Cath Findings:  Ao Pressure: 113/69 (88)  LV Pressure: 116/15/23  There was no signficant gradient across the aortic valve on pullback.  Left main: Mild plaque  LAD: Diffuse mild plaque throughout with 60-70% lesion in midsection  LCX: Gives off 2 OMs and 2 PLs and large atrial branch. 30% proximal lesion in LCX. Diffuse 40% plaque in mid AV groove LCX. OM-1 40% mid.  RCA: Large dominant vessel with diffuse caclific  disease. There is moderate sized PDA and PL1 and very large PL2. In RCA proper 30-40% ostial. Diffuse 40-50% in midsection. In mid to distal segment focal 80-90% lesion (mildly hazy). 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 [place]. RCA lesion seems to be culprit lesion for his symptoms however LAD lesion may also be significant. Will plan PCI RCA and flow-wire interrogation of LAD on Monday first case (unable to do case this afternoon due to 2 STEMISs). Start Brillinta.   12/30/12 - PTCA and stenting of the RCA Lesion Data:  Vessel: RCA  Percent stenosis (pre): 70-80% diffuse  TIMI-flow (pre): 3  Stent: 3.0 x 38 mm, 3.0 x 32 mm, and 3.0 x 24 mm Promus premier stents overlapping  Percent stenosis (post): 0%  TIMI-flow (post): 3  Conclusions:  1. Successful stenting of the proximal to distal RCA using overlapping drug-eluting stents.  2. Abnormal FFR of the LAD  Recommendations: Continue dual antiplatelet therapy. I would consider discharging the patient in the morning and bringing him back for intervention of the LAD in 1 to 2 weeks.  Ecg today shows more prominent T wave inversions in the inferior and lateral leads with increased voltage.   Assessment and Plan   1. Unstable Angina/CAD: Cath 2/28 showed 3v CAD w/ high-grade lesion in mid/distal RCA and borderline lesion in mid LAD, s/p 3 overlapping DES to RCA yesterday (3/3). Residual dz with abnormal FFR in the LAD with plans for staged PCI in 1-2 weeks. EF 40% by cath, 55-60% by echo. Cont ASA, Brilinta, BB, ACEI, statin, and PRN NTG. Case manager to assist in obtaining meds until he can get from the [place]. 2. Influenza: Afebrile. On Tamiflu, per  primary team 3. Normocytic Anemia: Hgb 14 --> 12.1. No signs of active bleeding. Guaiac stools given need for DAPT 4. Hypertension: BP stable. Cont meds as above 5. Hyperlipidemia: LDL 85, cont statin. 6. Diabetes Mellitus, Type 2: A1c 6.2, Hold Metformin for 48hrs post cath.  7. Tobacco Abuse: Encouraged complete cessation, including cigars.    Signed, HOPE, JESSICA PA-C  Patient seen and examined and history reviewed. Agree with above findings and plan. We will plan on patient returning on March 18th for reassessment of LAD disease. Patient had abnormal FFR of LAD. There is an intermediate stenosis in the mid LAD. On cath yesterday there also appeared to be some ostial disease that was not seen on his diagnostic study. This could be related to wire bias but will need more attention to this segment. May  need to repeat FFR with pullback. Given difficulty engaging left main from the radial approach will plan access from the groin for next procedure. Will DC on ASA 81 mg and Brilinta 90 mg bid. Continue lisinopril, amlodipine. Resume metformin tomorrow. I would favor continuing lopressor over atenolol. OK for discharge today. Will get free month of Brilinta. Will need [facility] check [facility] to see if [facility] will cover for full year- if not may have to switch to Plavix after one month.  Theron Arista [facility] 12/31/2012 7:17 AM

## 2013-01-02 LAB — CULTURE, BLOOD (ROUTINE X 2)

## 2013-01-03 LAB — CULTURE, BLOOD (ROUTINE X 2): Culture: NO GROWTH

## 2013-01-06 ENCOUNTER — Encounter (HOSPITAL_COMMUNITY): Payer: Self-pay | Admitting: Pharmacy Technician

## 2013-01-14 ENCOUNTER — Encounter (HOSPITAL_COMMUNITY): Payer: Self-pay | Admitting: General Practice

## 2013-01-14 ENCOUNTER — Encounter (HOSPITAL_COMMUNITY)
Admission: RE | Disposition: A | Discharge status: Home / Self Care | Payer: Self-pay | Source: Ambulatory Visit | Attending: Cardiology

## 2013-01-14 ENCOUNTER — Ambulatory Visit (HOSPITAL_COMMUNITY)
Admission: RE | Admit: 2013-01-14 | Discharge: 2013-01-15 | Disposition: A | Discharge status: Home / Self Care | Payer: Non-veteran care | Source: Ambulatory Visit | Attending: Cardiology | Admitting: Cardiology

## 2013-01-14 DIAGNOSIS — I25810 Atherosclerosis of coronary artery bypass graft(s) without angina pectoris: Secondary | ICD-10-CM

## 2013-01-14 DIAGNOSIS — E119 Type 2 diabetes mellitus without complications: Secondary | ICD-10-CM | POA: Insufficient documentation

## 2013-01-14 DIAGNOSIS — I200 Unstable angina: Secondary | ICD-10-CM | POA: Insufficient documentation

## 2013-01-14 DIAGNOSIS — Z9861 Coronary angioplasty status: Secondary | ICD-10-CM

## 2013-01-14 DIAGNOSIS — I2510 Atherosclerotic heart disease of native coronary artery without angina pectoris: Secondary | ICD-10-CM

## 2013-01-14 DIAGNOSIS — I10 Essential (primary) hypertension: Secondary | ICD-10-CM | POA: Insufficient documentation

## 2013-01-14 DIAGNOSIS — E785 Hyperlipidemia, unspecified: Secondary | ICD-10-CM

## 2013-01-14 HISTORY — PX: PERCUTANEOUS CORONARY STENT INTERVENTION (PCI-S): SHX5485

## 2013-01-14 LAB — CBC
HCT: 40.1 % (ref 39.0–52.0)
Hemoglobin: 14.2 g/dL (ref 13.0–17.0)
MCH: 30.8 pg (ref 26.0–34.0)
MCV: 87 fL (ref 78.0–100.0)
Platelets: 199 10*3/uL (ref 150–400)
RBC: 4.61 MIL/uL (ref 4.22–5.81)
WBC: 7.3 10*3/uL (ref 4.0–10.5)

## 2013-01-14 LAB — BASIC METABOLIC PANEL
CO2: 22 mEq/L (ref 19–32)
Chloride: 103 mEq/L (ref 96–112)
Glucose, Bld: 130 mg/dL — ABNORMAL HIGH (ref 70–99)
Potassium: 4.6 mEq/L (ref 3.5–5.1)
Sodium: 137 mEq/L (ref 135–145)

## 2013-01-14 LAB — GLUCOSE, CAPILLARY

## 2013-01-14 SURGERY — PERCUTANEOUS CORONARY STENT INTERVENTION (PCI-S)
Anesthesia: LOCAL

## 2013-01-14 MED ORDER — LISINOPRIL 20 MG PO TABS
20.0000 mg | ORAL_TABLET | Freq: Every day | ORAL | Status: DC
  Administered 2013-01-15: 20 mg via ORAL
  Filled 2013-01-14: qty 1

## 2013-01-14 MED ORDER — PRAVASTATIN SODIUM 20 MG PO TABS
20.0000 mg | ORAL_TABLET | Freq: Every day | ORAL | Status: DC
  Administered 2013-01-15: 09:00:00 20 mg via ORAL
  Filled 2013-01-14 (×2): qty 1

## 2013-01-14 MED ORDER — FLUTICASONE PROPIONATE 50 MCG/ACT NA SUSP
2.0000 | Freq: Every day | NASAL | Status: DC
  Filled 2013-01-14: qty 16

## 2013-01-14 MED ORDER — NITROGLYCERIN 0.4 MG SL SUBL: 0.4000 mg | SUBLINGUAL_TABLET | SUBLINGUAL | Status: DC | PRN

## 2013-01-14 MED ORDER — TICAGRELOR 90 MG PO TABS
90.0000 mg | ORAL_TABLET | Freq: Two times a day (BID) | ORAL | Status: DC
  Administered 2013-01-14 – 2013-01-15 (×2): 90 mg via ORAL
  Filled 2013-01-14 (×3): qty 1

## 2013-01-14 MED ORDER — ONDANSETRON HCL 4 MG/2ML IJ SOLN: 4.0000 mg | Freq: Four times a day (QID) | INTRAMUSCULAR | Status: DC | PRN

## 2013-01-14 MED ORDER — ALPRAZOLAM 0.25 MG PO TABS
0.2500 mg | ORAL_TABLET | Freq: Two times a day (BID) | ORAL | Status: DC | PRN
  Administered 2013-01-14 (×2): 0.25 mg via ORAL
  Filled 2013-01-14 (×2): qty 1

## 2013-01-14 MED ORDER — ATORVASTATIN CALCIUM 20 MG PO TABS
20.0000 mg | ORAL_TABLET | Freq: Every day | ORAL | Status: DC
  Filled 2013-01-14: qty 1

## 2013-01-14 MED ORDER — SODIUM CHLORIDE 0.9 % IJ SOLN: 3.0000 mL | Freq: Two times a day (BID) | INTRAMUSCULAR | Status: DC

## 2013-01-14 MED ORDER — LORATADINE 10 MG PO TABS
10.0000 mg | ORAL_TABLET | Freq: Every day | ORAL | Status: DC
  Filled 2013-01-14 (×2): qty 1

## 2013-01-14 MED ORDER — ATENOLOL 12.5 MG HALF TABLET
12.5000 mg | ORAL_TABLET | Freq: Every day | ORAL | Status: DC
  Administered 2013-01-15: 12.5 mg via ORAL
  Filled 2013-01-14: qty 1

## 2013-01-14 MED ORDER — ACETAMINOPHEN 325 MG PO TABS
650.0000 mg | ORAL_TABLET | ORAL | Status: DC | PRN
  Administered 2013-01-14: 650 mg via ORAL
  Filled 2013-01-14: qty 2

## 2013-01-14 MED ORDER — SODIUM CHLORIDE 0.9 % IV SOLN: 250.0000 mL | INTRAVENOUS | Status: DC | PRN

## 2013-01-14 MED ORDER — MIDAZOLAM HCL 2 MG/2ML IJ SOLN
INTRAMUSCULAR | Status: AC
  Filled 2013-01-14: qty 2

## 2013-01-14 MED ORDER — LIDOCAINE HCL (PF) 1 % IJ SOLN
INTRAMUSCULAR | Status: AC
  Filled 2013-01-14: qty 30

## 2013-01-14 MED ORDER — HEPARIN (PORCINE) IN NACL 2-0.9 UNIT/ML-% IJ SOLN
INTRAMUSCULAR | Status: AC
  Filled 2013-01-14: qty 1000

## 2013-01-14 MED ORDER — STUDY - INVESTIGATIONAL DRUG SIMPLE RECORD
50.0000 mg | Freq: Once | Status: DC
  Filled 2013-01-14: qty 50

## 2013-01-14 MED ORDER — FENTANYL CITRATE 0.05 MG/ML IJ SOLN
INTRAMUSCULAR | Status: AC
  Filled 2013-01-14: qty 2

## 2013-01-14 MED ORDER — METFORMIN HCL 500 MG PO TABS
500.0000 mg | ORAL_TABLET | Freq: Every day | ORAL | Status: DC
  Filled 2013-01-14 (×2): qty 1

## 2013-01-14 MED ORDER — SODIUM CHLORIDE 0.9 % IJ SOLN: 3.0000 mL | INTRAMUSCULAR | Status: DC | PRN

## 2013-01-14 MED ORDER — SODIUM CHLORIDE 0.9 % IV SOLN
1.0000 mL/kg/h | INTRAVENOUS | Status: AC
  Administered 2013-01-14: 1 mL/kg/h via INTRAVENOUS

## 2013-01-14 MED ORDER — STUDY - INVESTIGATIONAL DRUG SIMPLE RECORD
1.0000 mg/kg | Freq: Once | Status: DC
  Filled 2013-01-14: qty 99.8

## 2013-01-14 MED ORDER — NITROGLYCERIN 1 MG/10 ML FOR IR/CATH LAB
INTRA_ARTERIAL | Status: AC
  Filled 2013-01-14: qty 10

## 2013-01-14 MED ORDER — SODIUM CHLORIDE 0.9 % IV SOLN
1.0000 mL/kg/h | INTRAVENOUS | Status: DC
  Administered 2013-01-14: 1 mL/kg/h via INTRAVENOUS

## 2013-01-14 MED ORDER — SIMVASTATIN 40 MG PO TABS
40.0000 mg | ORAL_TABLET | Freq: Every day | ORAL | Status: DC
  Filled 2013-01-14: qty 1

## 2013-01-14 MED ORDER — ADENOSINE 12 MG/4ML IV SOLN
16.0000 mL | Freq: Once | INTRAVENOUS | Status: DC
  Filled 2013-01-14: qty 16

## 2013-01-14 MED ORDER — ASPIRIN 81 MG PO CHEW
81.0000 mg | CHEWABLE_TABLET | Freq: Every day | ORAL | Status: DC
  Administered 2013-01-15: 81 mg via ORAL
  Filled 2013-01-14: qty 1

## 2013-01-14 MED ORDER — STUDY - INVESTIGATIONAL DRUG SIMPLE RECORD
100.0000 mg | Freq: Once | Status: DC
  Filled 2013-01-14: qty 100

## 2013-01-14 MED ORDER — ASPIRIN 81 MG PO CHEW
324.0000 mg | CHEWABLE_TABLET | ORAL | Status: AC
  Administered 2013-01-14: 324 mg via ORAL
  Filled 2013-01-14: qty 4

## 2013-01-14 MED ORDER — ADULT MULTIVITAMIN W/MINERALS CH
1.0000 | ORAL_TABLET | Freq: Every day | ORAL | Status: DC
  Administered 2013-01-15: 09:00:00 1 via ORAL
  Filled 2013-01-14: qty 1

## 2013-01-14 MED ORDER — AMLODIPINE BESYLATE 10 MG PO TABS
10.0000 mg | ORAL_TABLET | Freq: Every day | ORAL | Status: DC
  Administered 2013-01-15: 10 mg via ORAL
  Filled 2013-01-14: qty 1

## 2013-01-14 NOTE — Research (Signed)
REGULATE PCI Informed Consent   Subject Name: Kenneth [name]  Subject met inclusion and exclusion criteria.  The informed consent form, study requirements and expectations were reviewed with the subject and questions and concerns were addressed prior to the signing of the consent form.  The subject verbalized understanding of the trail requirements.  The subject agreed to participate in the REGULATE PCI trial and signed the informed consent.  The informed consent was obtained prior to performance of any protocol-specific procedures for the subject.  A copy of the signed informed consent was given to the subject and a copy was placed in the subject's medical record.  Brunilda Payor, RN 01/14/2013, 08:50

## 2013-01-14 NOTE — H&P (View-Only) (Signed)
Cardiology Progress Note Patient Name: Kenneth [name] Date of Encounter: 12/31/2012, 6:23 AM     Subjective  No complaints of chest pain or SOB. Slept well.    Objective   Telemetry: Sinus rhythm 60-70s  Medications: . aspirin EC  81 mg Oral Daily  . cefUROXime  250 mg Oral BID WC  . fluticasone  2 spray Each Nare Daily  . isosorbide mononitrate  30 mg Oral Daily  . lisinopril  20 mg Oral Daily  . loratadine  10 mg Oral Daily  . metoprolol tartrate  25 mg Oral BID  . oseltamivir  75 mg Oral BID  . pravastatin  20 mg Oral q1800  . sodium chloride  3 mL Intravenous Q12H  . Ticagrelor  90 mg Oral BID      Physical Exam:  Temp:  [97.5 F (36.4 C)-98.3 F (36.8 C)] 98.1 F (36.7 C) (03/04 0609) Pulse Rate:  [60-80] 67 (03/04 0609) BP: (113-193)/(70-126) 153/82 mmHg (03/04 0609) SpO2:  [96 %-100 %] 99 % (03/04 0609) Weight:  [216 lb 4.3 oz (98.1 kg)] 216 lb 4.3 oz (98.1 kg) (03/04 0609)  General: Overweight elderly male, in no acute distress. Head: Normocephalic, atraumatic, sclera non-icteric, nares are without discharge.  Neck: Supple. Negative for carotid bruits or JVD Lungs: Clear bilaterally to auscultation without wheezes, rales, or rhonchi. Breathing is unlabored. Heart: RRR S1 S2 without murmurs, rubs, or gallops.  Abdomen: Soft, non-tender, non-distended with normoactive bowel sounds. No rebound/guarding. No obvious abdominal masses. Msk:  Strength and tone appear normal for age. Extremities: No edema. No clubbing or cyanosis. Distal pedal pulses are intact and equal bilaterally. No radial hematoma. Neuro: Alert and oriented X 3. Moves all extremities spontaneously. Psych:  Responds to questions appropriately with a normal affect.   Intake/Output Summary (Last 24 hours) at 12/31/12 0623 Last data filed at 12/30/12 2100  Gross per 24 hour  Intake   1056 ml  Output      0 ml  Net   1056 ml    Labs:   12/30/12 0640 12/31/12 0440  NA 139 136  K 3.4*  3.7  CL 103 103  CO2 26 23  GLUCOSE 106* 96  BUN 11 13  CREATININE 0.88 0.90  CALCIUM 9.0 8.6    12/30/12 0640 12/31/12 0440  WBC 3.7* 4.2  HGB 14.0 12.1*  HCT 40.7 34.6*  MCV 86.4 86.3  PLT 144* 139*   Radiology/Studies:   12/26/2012  -  CHEST - 2 VIEW  Findings: Cardiomegaly.  Clear lung fields.  No effusion or pneumothorax.  Bones unremarkable.  IMPRESSION: No active cardiopulmonary disease.    12/28/2012- PORTABLE CHEST - 1 VIEW  Findings:  Apparent enlargement of the cardiac silhouette may be accentuated due to decreased lung volumes and AP projection.  Mild cephalization of flow without frank evidence of edema.  Minimal bibasilar opacities favored to represent atelectasis.  No focal airspace opacities.  No definite pleural effusion, though note, the bilateral costophrenic angles are excluded from view.  No pneumothorax.  Unchanged bones.  IMPRESSION: 1.  Apparent enlargement of the cardiac silhouette may be accentuated due to the AP projection, though note, a pericardial effusion may have a similar appearance.  Further evaluation with cardiac echo may be performed as clinically indicated.  2. Pulmonary venous congestion without frank evidence of edema.      12/27/12 - Echo Study Conclusions: - Left ventricle: The cavity size was normal. Wall thickness was  increased in a pattern of mild LVH. Systolic function was normal. The estimated ejection fraction was in the range of 55% to 60%. - Left atrium: The atrium was mildly to moderately dilated.  12/27/12 - Cardiac Cath Findings:  Ao Pressure: 113/69 (88)  LV Pressure: 116/15/23  There was no signficant gradient across the aortic valve on pullback.  Left main: Mild plaque  LAD: Diffuse mild plaque throughout with 60-70% lesion in midsection  LCX: Gives off 2 OMs and 2 PLs and large atrial branch. 30% proximal lesion in LCX. Diffuse 40% plaque in mid AV groove LCX. OM-1 40% mid.  RCA: Large dominant vessel with diffuse caclific  disease. There is moderate sized PDA and PL1 and very large PL2. In RCA proper 30-40% ostial. Diffuse 40-50% in midsection. In mid to distal segment focal 80-90% lesion (mildly hazy). 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 [place]. RCA lesion seems to be culprit lesion for his symptoms however LAD lesion may also be significant. Will plan PCI RCA and flow-wire interrogation of LAD on Monday first case (unable to do case this afternoon due to 2 STEMISs). Start Brillinta.   12/30/12 - PTCA and stenting of the RCA Lesion Data:  Vessel: RCA  Percent stenosis (pre): 70-80% diffuse  TIMI-flow (pre): 3  Stent: 3.0 x 38 mm, 3.0 x 32 mm, and 3.0 x 24 mm Promus premier stents overlapping  Percent stenosis (post): 0%  TIMI-flow (post): 3  Conclusions:  1. Successful stenting of the proximal to distal RCA using overlapping drug-eluting stents.  2. Abnormal FFR of the LAD  Recommendations: Continue dual antiplatelet therapy. I would consider discharging the patient in the morning and bringing him back for intervention of the LAD in 1 to 2 weeks.  Ecg today shows more prominent T wave inversions in the inferior and lateral leads with increased voltage.   Assessment and Plan   1. Unstable Angina/CAD: Cath 2/28 showed 3v CAD w/ high-grade lesion in mid/distal RCA and borderline lesion in mid LAD, s/p 3 overlapping DES to RCA yesterday (3/3). Residual dz with abnormal FFR in the LAD with plans for staged PCI in 1-2 weeks. EF 40% by cath, 55-60% by echo. Cont ASA, Brilinta, BB, ACEI, statin, and PRN NTG. Case manager to assist in obtaining meds until he can get from the [place]. 2. Influenza: Afebrile. On Tamiflu, per  primary team 3. Normocytic Anemia: Hgb 14 --> 12.1. No signs of active bleeding. Guaiac stools given need for DAPT 4. Hypertension: BP stable. Cont meds as above 5. Hyperlipidemia: LDL 85, cont statin. 6. Diabetes Mellitus, Type 2: A1c 6.2, Hold Metformin for 48hrs post cath.  7. Tobacco Abuse: Encouraged complete cessation, including cigars.    Signed, HOPE, Tamiko Leopard PA-C  Patient seen and examined and history reviewed. Agree with above findings and plan. We will plan on patient returning on March 18th for reassessment of LAD disease. Patient had abnormal FFR of LAD. There is an intermediate stenosis in the mid LAD. On cath yesterday there also appeared to be some ostial disease that was not seen on his diagnostic study. This could be related to wire bias but will need more attention to this segment. May  need to repeat FFR with pullback. Given difficulty engaging left main from the radial approach will plan access from the groin for next procedure. Will DC on ASA 81 mg and Brilinta 90 mg bid. Continue lisinopril, amlodipine. Resume metformin tomorrow. I would favor continuing lopressor over atenolol. OK for discharge today. Will get free month of Brilinta. Will need to check [facility] to see if [facility] will cover for full year- if not may have to switch to Plavix after one month.  Theron Arista [facility] 12/31/2012 7:17 AM

## 2013-01-14 NOTE — CV Procedure (Signed)
   CARDIAC CATH NOTE  Name: Kenneth [name] MRN: [id number] DOB: May 22, 1946  Procedure: PTCA and stenting of the mid LAD  Indication: 67 year old white male who recently presented with unstable angina. He has a history of hypertension and hyperlipidemia. Cardiac catheterization at that time demonstrated severe stenosis in the mid and distal RCA. He also had a moderate mid LAD stenosis. He underwent extensive stenting of the right coronary. Flow wire analysis of the LAD  at that time showed an abnormal fractional flow reserve of the mid LAD of 0.71. He returns at this time for intervention of the LAD.  Procedural Details: The right groin was prepped, draped, and anesthetized with 1% lidocaine. Using the modified Seldinger technique, a 6 Fr sheath was introduced into the right femoral artery.  The patient was anticoagulated under the Regulate PCI study protocol. Once a therapeutic ACT was achieved, a 6 [place] left Voda 4 guide catheter was inserted.  An Asahi medium coronary guidewire was used to cross the lesion.  The lesion was predilated with a 2.5 mm balloon. Because of acute angulation in the mid LAD prior to the lesion were unable to pass a stent at this point. We used a buddy wire and predilated again with a 3.0 mm balloon. The lesion was then stented with a 3.0 x 12 mm Promus premier stent.  The stent was postdilated with a 3.25 mm noncompliant balloon.  Following PCI, there was 0% residual stenosis and TIMI-3 flow. Final angiography confirmed an excellent result. The patient tolerated the procedure well. There were no immediate procedural complications. Femoral hemostasis was achieved with manual compression. The patient was transferred to the post catheterization recovery area for further monitoring.  Lesion Data: Vessel: LAD Percent stenosis (pre): 70% TIMI-flow (pre):  3 Stent:  3.0 x 12 mm Promus premier Percent stenosis (post): 0% TIMI-flow (post): 3  Conclusions: Successful stenting of  the mid LAD with a drug-eluting stent.  Recommendations: Continue dual antiplatelet therapy for one year.  Theron Arista [facility] 01/14/2013, 11:24 AM

## 2013-01-14 NOTE — Interval H&P Note (Signed)
History and Physical Interval Note:  01/14/2013 10:00 AM  Kenneth [name]  has presented today for surgery, with the diagnosis of staged pci  The various methods of treatment have been discussed with the patient and family. After consideration of risks, benefits and other options for treatment, the patient has consented to  Procedure(s): PERCUTANEOUS CORONARY STENT INTERVENTION (PCI-S) (N/A) as a surgical intervention .  The patient's history has been reviewed, patient examined, no change in status, stable for surgery.  I have reviewed the patient's chart and labs.  Questions were answered to the patient's satisfaction.     Theron Arista [facility] 01/14/2013 10:01 AM

## 2013-01-14 NOTE — Progress Notes (Signed)
Utilization Review Completed Deward Sebek J. Tanieka Pownall, RN, BSN, NCM [phone]  

## 2013-01-15 ENCOUNTER — Encounter (HOSPITAL_COMMUNITY): Payer: Self-pay | Admitting: Nurse Practitioner

## 2013-01-15 DIAGNOSIS — I2510 Atherosclerotic heart disease of native coronary artery without angina pectoris: Secondary | ICD-10-CM

## 2013-01-15 DIAGNOSIS — E785 Hyperlipidemia, unspecified: Secondary | ICD-10-CM

## 2013-01-15 DIAGNOSIS — Z9861 Coronary angioplasty status: Secondary | ICD-10-CM

## 2013-01-15 DIAGNOSIS — I10 Essential (primary) hypertension: Secondary | ICD-10-CM

## 2013-01-15 DIAGNOSIS — I200 Unstable angina: Secondary | ICD-10-CM

## 2013-01-15 LAB — CBC
HCT: 33.9 % — ABNORMAL LOW (ref 39.0–52.0)
Hemoglobin: 12 g/dL — ABNORMAL LOW (ref 13.0–17.0)
MCHC: 35.4 g/dL (ref 30.0–36.0)
MCV: 85.2 fL (ref 78.0–100.0)
RDW: 14.2 % (ref 11.5–15.5)

## 2013-01-15 LAB — BASIC METABOLIC PANEL
BUN: 14 mg/dL (ref 6–23)
Chloride: 105 mEq/L (ref 96–112)
Creatinine, Ser: 0.97 mg/dL (ref 0.50–1.35)
GFR calc Af Amer: >90 mL/min (ref >90)
Glucose, Bld: 116 mg/dL — ABNORMAL HIGH (ref 70–99)
Potassium: 4.2 mEq/L (ref 3.5–5.1)

## 2013-01-15 MED ORDER — METFORMIN HCL 500 MG PO TABS: 500.0000 mg | ORAL_TABLET | Freq: Every day | ORAL | Status: DC

## 2013-01-15 NOTE — Discharge Summary (Signed)
Patient seen and examined and history reviewed. Agree with above findings and plan. See earlier rounding note.  Kenneth [name] 01/15/2013 11:38 AM

## 2013-01-15 NOTE — Progress Notes (Signed)
TELEMETRY: Reviewed telemetry pt in NSR: Filed Vitals:   01/14/13 1705 01/14/13 2008 01/15/13 0032 01/15/13 0508  BP: 141/81 147/88 134/50 150/79  Pulse: 79 72 71 73  Temp: 98.1 F (36.7 C) 97.9 F (36.6 C) 98.1 F (36.7 C) 97.6 F (36.4 C)  TempSrc: Oral Oral Oral Oral  Resp:      Height:      Weight:    221 lb 1.9 oz (100.3 kg)  SpO2: 100% 98% 96% 99%    Intake/Output Summary (Last 24 hours) at 01/15/13 0733 Last data filed at 01/15/13 0521  Gross per 24 hour  Intake    700 ml  Output   1200 ml  Net   -500 ml    SUBJECTIVE Feels well. Can tell he feels better already post PCI. Mild tightness right groin.  LABS: Basic Metabolic Panel:  Recent Labs  16/10/96 0714 01/15/13 0430  NA 137 139  K 4.6 4.2  CL 103 105  CO2 22 25  GLUCOSE 130* 116*  BUN 19 14  CREATININE 0.79 0.97  CALCIUM 9.0 8.4   CBC:  Recent Labs  01/14/13 0714 01/15/13 0430  WBC 7.3 8.2  HGB 14.2 12.0*  HCT 40.1 33.9*  MCV 87.0 85.2  PLT 199 172    Radiology/Studies:  Dg Chest 2 View  12/26/2012  *RADIOLOGY REPORT*  Clinical Data: Chest pain.  Diabetic, hypertensive.  CHEST - 2 VIEW  Comparison: None.  Findings: Cardiomegaly.  Clear lung fields.  No effusion or pneumothorax.  Bones unremarkable.  IMPRESSION: No active cardiopulmonary disease.   Original Report Authenticated By: Davonna Belling, M.D.    Dg Chest Port 1 View  12/28/2012  *RADIOLOGY REPORT*  Clinical Data: Intermittent left-sided chest pain and pressure  PORTABLE CHEST - 1 VIEW  Comparison: 12/26/2012  Findings:  Apparent enlargement of the cardiac silhouette may be accentuated due to decreased lung volumes and AP projection.  Mild cephalization of flow without frank evidence of edema.  Minimal bibasilar opacities favored to represent atelectasis.  No focal airspace opacities.  No definite pleural effusion, though note, the bilateral costophrenic angles are excluded from view.  No pneumothorax.  Unchanged bones.  IMPRESSION: 1.   Apparent enlargement of the cardiac silhouette may be accentuated due to the AP projection, though note, a pericardial effusion may have a similar appearance.  Further evaluation with cardiac echo may be performed as clinically indicated.  2. Pulmonary venous congestion without frank evidence of edema.   Original Report Authenticated By: Tacey Ruiz, MD    Ecg: NSR, T wave inversion inferiorly.  PHYSICAL EXAM General: Well developed, overweight, in no acute distress. Head: Normocephalic, atraumatic, sclera non-icteric, no xanthomas, nares are without discharge. Neck: Negative for carotid bruits. JVD not elevated. Lungs: Clear bilaterally to auscultation without wheezes, rales, or rhonchi. Breathing is unlabored. Heart: RRR S1 S2 without murmurs, rubs, or gallops.  Abdomen: Soft, non-tender, non-distended with normoactive bowel sounds. No hepatomegaly. No rebound/guarding. No obvious abdominal masses. Msk:  Strength and tone appears normal for age. Extremities: No clubbing, cyanosis or edema.  Distal pedal pulses are 2+ and equal bilaterally. Right groin with mild bruising, soft. No hematoma or bruit. Neuro: Alert and oriented X 3. Moves all extremities spontaneously. Psych:  Responds to questions appropriately with a normal affect.  ASSESSMENT AND PLAN: 1. CAD with recent USAP. S/p extensive stenting previously of RCA. Now s/p stenting of mid LAD with abnormal FFR. Continue ASA and Brilinta for one year. 2. Hyperlipidemia.  On pravastatin. 3. HTN continue lisinopril, metoprolol, and amlodipine. 4. DM resume metformin 48 hrs post cath.  OK for discharge today. Encourage phase 2 cardiac Rehab.  Active Problems:   Unstable angina   Hypertension   Hyperlipidemia   CAD S/P percutaneous coronary angioplasty    Signed, Rony Ratz [place] MD,FACC 01/15/2013 7:33 AM

## 2013-01-15 NOTE — Discharge Summary (Signed)
Patient ID: Kenneth [name],  MRN: [id number], DOB/AGE: 1946-05-13 67 y.o.  Admit date: 01/14/2013 Discharge date: 01/15/2013  Primary Care Provider: Morton Stall, MC @ [facility] Primary Cardiologist: [facility]  Discharge Diagnoses Principal Problem:   CAD S/P percutaneous coronary angioplasty Active Problems:   Hypertension   Hyperlipidemia  Allergies No Known Allergies  Procedures  Successful PCI and stenting of the LAD with placement of a 3.0 x 12mm Promus Premier DES.  History of Present Illness  67 y/o male with prior h/o CAD s/p PTCA in 1999.  He was recently admitted to [facility] in last February secondary to unstable angina.  He subsequently underwent catheterization revealing severe RCA and LAD disease.  The RCA was treated with three Promus Premier drug-eluting stents, while FFR on the LAD was abnormal @ 0.71.  Decision was made to pursue a staged PCI of the LAD and pt was discharged from the hospital on 3/4, with plans to return on 3/18.  Hospital Course  Pt presented to the [facility] on 01/14/2013.  He underwent catheterization and successful PCI and stenting of the LAD with placement of a 3.0 x 12 mm Promus Premier DES.  He tolerated procedure well and post-procedure has been maintained on low-dose ASA and Brilinta.  He has been ambulating without difficulty and will be discharged home today in good condition.  He plans to follow-up @ the [facility] in [place] for continued cardiology care, and has an appointment next week.  Discharge Vitals Blood pressure 171/100, pulse 73, temperature 97.7 F (36.5 C), temperature source Oral, resp. rate 18, height 5\' 8"  (1.727 m), weight 221 lb 1.9 oz (100.3 kg), SpO2 99.00%.  Filed Weights   01/14/13 0710 01/15/13 0508  Weight: 220 lb (99.791 kg) 221 lb 1.9 oz (100.3 kg)    Labs  CBC  Recent Labs  01/14/13 0714 01/15/13 0430  WBC 7.3 8.2  HGB 14.2 12.0*  HCT 40.1 33.9*  MCV 87.0  85.2  PLT 199 172   Basic Metabolic Panel  Recent Labs  01/14/13 0714 01/15/13 0430  NA 137 139  K 4.6 4.2  CL 103 105  CO2 22 25  GLUCOSE 130* 116*  BUN 19 14  CREATININE 0.79 0.97  CALCIUM 9.0 8.4   Disposition  Pt is being discharged home today in good condition.  Follow-up Plans & Appointments  Follow-up Information   Follow up with Lavone Neri, MD. (as scheduled for next week.)    Contact information:   [facility]    Discharge Medications    Medication List    STOP taking these medications       naproxen sodium 220 MG tablet  Commonly known as:  ANAPROX      TAKE these medications       amLODipine 10 MG tablet  Commonly known as:  NORVASC  Take 1 tablet (10 mg total) by mouth daily.     aspirin 81 MG chewable tablet  Chew 81 mg by mouth daily.     atenolol 25 MG tablet  Commonly known as:  TENORMIN  Take 12.5 mg by mouth daily.     cetirizine 10 MG tablet  Commonly known as:  ZYRTEC  Take 10 mg by mouth daily.     fluticasone 50 MCG/ACT nasal spray  Commonly known as:  FLONASE  Place 2 sprays into the nose daily.     lisinopril 40 MG tablet  Commonly known as:  PRINIVIL,ZESTRIL  Take  0.5 tablets (20 mg total) by mouth daily.     metFORMIN 500 MG tablet  Commonly known as:  GLUCOPHAGE  Take 1 tablet (500 mg total) by mouth daily.     multivitamin with minerals Tabs  Take 1 tablet by mouth daily. Centrum Silver     nitroGLYCERIN 0.4 MG SL tablet  Commonly known as:  NITROSTAT  Place 1 tablet (0.4 mg total) under the tongue every 5 (five) minutes x 3 doses as needed for chest pain.     pravastatin 40 MG tablet  Commonly known as:  PRAVACHOL  Take 0.5 tablets (20 mg total) by mouth daily.     Ticagrelor 90 MG Tabs tablet  Commonly known as:  BRILINTA  Take 1 tablet (90 mg total) by mouth 2 (two) times daily.      Outstanding Labs/Studies  None  Duration of Discharge Encounter   Greater than 30 minutes including physician  time.  Signed, Nicolasa Ducking NP 01/15/2013, 8:18 AM

## 2013-01-15 NOTE — Progress Notes (Signed)
CARDIAC REHAB PHASE I   PRE:  Rate/Rhythm: 72 SR  BP:  Supine:   Sitting: 151/95  Standing:    SaO2:   MODE:  Ambulation: 1000 ft   POST:  Rate/Rhythm: 92 SR  BP:  Supine:   Sitting: 167/99  Standing:    SaO2:  0805-0840 Pt tolerated ambulation  well without c/o. BP up before and after walk. Pt back to side of bed after walk with call light in reach. Reviewed discharge education with pt. He voices understanding. Pt is interested in Outpt. [facility] in [place], sent referral last admission, I will update them that he had now had his second procedure.  Melina Copa RN 01/15/2013 8:40 AM

## 2013-01-20 ENCOUNTER — Telehealth: Payer: Self-pay | Admitting: *Deleted

## 2013-01-20 NOTE — Telephone Encounter (Signed)
LEFT VOICE MAIL TO CALL BACK  TCM PT .Zack Seal

## 2013-01-20 NOTE — Telephone Encounter (Signed)
SPOKE WITH PT, FEELS FINE  AND IS TAKING  MEDS AS DIRECTED FROM DISCHARGE .[facility] CAN [facility]U WITH DR [place]  FOR HEART. PT TO CALL BACK TO SCHEDULE  APPT IF  NEEDED .Zack Seal

## 2013-01-20 NOTE — Telephone Encounter (Signed)
F/u   Patient returning nurse call

## 2013-08-06 ENCOUNTER — Encounter: Payer: Non-veteran care | Admitting: Cardiology

## 2013-08-13 LAB — IFOBT (OCCULT BLOOD): IFOBT: POSITIVE

## 2013-08-14 ENCOUNTER — Encounter: Payer: Self-pay | Admitting: Gastroenterology

## 2013-09-05 ENCOUNTER — Telehealth: Payer: Self-pay

## 2013-09-05 NOTE — Telephone Encounter (Signed)
Received a fax request from Dr Alysia Penna at [facility] requesting patient's last exam and diagnostic test.Faxed to fax # [phone].

## 2013-09-10 ENCOUNTER — Telehealth: Payer: Self-pay | Admitting: Cardiology

## 2013-09-10 NOTE — Telephone Encounter (Signed)
New problem:  Malachi Bonds from [facility] is calling to see if Dr. [place] wants to see the pt sooner. Malachi Bonds states the pt was diagnosed today by Dr. Link Snuffer for having Pulmonary Edma. The Doctor started the pt on lasix. Malachi Bonds would like a call back with Dr. Elvis Coil advisement on if the pt should be seen sooner or not. Please advise

## 2013-09-11 NOTE — Telephone Encounter (Signed)
Returned call to [place] at [facility] left message on her personal voice mail spoke to Dr.Jordan he advised ok for patient to keep appointment with him 09/18/13.

## 2013-09-16 ENCOUNTER — Ambulatory Visit: Payer: Non-veteran care | Admitting: Gastroenterology

## 2013-09-16 ENCOUNTER — Telehealth: Payer: Self-pay | Admitting: Gastroenterology

## 2013-09-16 NOTE — Telephone Encounter (Signed)
OK for no charge this time. Will charge next time.

## 2013-09-16 NOTE — Telephone Encounter (Signed)
Do you want to charge? 

## 2013-09-18 ENCOUNTER — Encounter: Payer: Self-pay | Admitting: Cardiology

## 2013-09-18 ENCOUNTER — Ambulatory Visit (INDEPENDENT_AMBULATORY_CARE_PROVIDER_SITE_OTHER): Payer: Medicare PPO | Admitting: Cardiology

## 2013-09-18 VITALS — BP 136/89 | HR 64 | Ht 68.0 in | Wt 218.0 lb

## 2013-09-18 DIAGNOSIS — Z9861 Coronary angioplasty status: Secondary | ICD-10-CM

## 2013-09-18 DIAGNOSIS — I5043 Acute on chronic combined systolic (congestive) and diastolic (congestive) heart failure: Secondary | ICD-10-CM | POA: Insufficient documentation

## 2013-09-18 DIAGNOSIS — I5032 Chronic diastolic (congestive) heart failure: Secondary | ICD-10-CM

## 2013-09-18 DIAGNOSIS — I509 Heart failure, unspecified: Secondary | ICD-10-CM

## 2013-09-18 DIAGNOSIS — E785 Hyperlipidemia, unspecified: Secondary | ICD-10-CM

## 2013-09-18 DIAGNOSIS — I2510 Atherosclerotic heart disease of native coronary artery without angina pectoris: Secondary | ICD-10-CM

## 2013-09-18 DIAGNOSIS — I10 Essential (primary) hypertension: Secondary | ICD-10-CM

## 2013-09-18 HISTORY — DX: Chronic diastolic (congestive) heart failure: I50.32

## 2013-09-18 MED ORDER — CARVEDILOL 6.25 MG PO TABS: 6.2500 mg | ORAL_TABLET | Freq: Two times a day (BID) | ORAL | Status: DC

## 2013-09-18 NOTE — Patient Instructions (Signed)
Stop atenolol.  Start carvedilol 6.25 mg twice a day.  Continue your other therapy  Sodium restriction.   I will see you in 2 weeks.  1.5 Gram Low Sodium Diet A 1.5 gram sodium diet restricts the amount of sodium in the diet to no more than 1.5 g or 1500 mg daily. The American Heart Association recommends Americans over the age of 83 to consume no more than 1500 mg of sodium each day to reduce the risk of developing high blood pressure. Research also shows that limiting sodium may reduce heart attack and stroke risk. Many foods contain sodium for flavor and sometimes as a preservative. When the amount of sodium in a diet needs to be low, it is important to know what to look for when choosing foods and drinks. The following includes some information and guidelines to help make it easier for you to adapt to a low sodium diet. QUICK TIPS  Do not add salt to food.  Avoid convenience items and fast food.  Choose unsalted snack foods.  Buy lower sodium products, often labeled as "lower sodium" or "no salt added."  Check food labels to learn how much sodium is in 1 serving.  When eating at a restaurant, ask that your food be prepared with less salt or none, if possible. READING FOOD LABELS FOR SODIUM INFORMATION The nutrition facts label is a good place to find how much sodium is in foods. Look for products with no more than 400 mg of sodium per serving. Remember that 1.5 g = 1500 mg. The food label may also list foods as:  Sodium-free: Less than 5 mg in a serving.  Very low sodium: 35 mg or less in a serving.  Low-sodium: 140 mg or less in a serving.  Light in sodium: 50% less sodium in a serving. For example, if a food that usually has 300 mg of sodium is changed to become light in sodium, it will have 150 mg of sodium.  Reduced sodium: 25% less sodium in a serving. For example, if a food that usually has 400 mg of sodium is changed to reduced sodium, it will have 300 mg of  sodium. CHOOSING FOODS Grains  Avoid: Salted crackers and snack items. Some cereals, including instant hot cereals. Bread stuffing and biscuit mixes. Seasoned rice or pasta mixes.  Choose: Unsalted snack items. Low-sodium cereals, oats, puffed wheat and rice, shredded wheat. English muffins and bread. Pasta. Meats  Avoid: Salted, canned, smoked, spiced, pickled meats, including fish and poultry. Bacon, ham, sausage, cold cuts, hot dogs, anchovies.  Choose: Low-sodium canned tuna and salmon. Fresh or frozen meat, poultry, and fish. Dairy  Avoid: Processed cheese and spreads. Cottage cheese. Buttermilk and condensed milk. Regular cheese.  Choose: Milk. Low-sodium cottage cheese. Yogurt. Sour cream. Low-sodium cheese. Fruits and Vegetables  Avoid: Regular canned vegetables. Regular canned tomato sauce and paste. Frozen vegetables in sauces. Olives. Rosita Fire. Relishes. Sauerkraut.  Choose: Low-sodium canned vegetables. Low-sodium tomato sauce and paste. Frozen or fresh vegetables. Fresh and frozen fruit. Condiments  Avoid: Canned and packaged gravies. Worcestershire sauce. Tartar sauce. Barbecue sauce. Soy sauce. Steak sauce. Ketchup. Onion, garlic, and table salt. Meat flavorings and tenderizers.  Choose: Fresh and dried herbs and spices. Low-sodium varieties of mustard and ketchup. Lemon juice. Tabasco sauce. Horseradish. SAMPLE 1.5 GRAM SODIUM MEAL PLAN Breakfast / Sodium (mg)  1 cup low-fat milk / 143 mg  1 whole-wheat English muffin / 240 mg  1 tbs heart-healthy margarine / 153 mg  1 hard-boiled egg / 139 mg  1 small orange / 0 mg Lunch / Sodium (mg)  1 cup raw carrots / 76 mg  2 tbs no salt added peanut butter / 5 mg  2 slices whole-wheat bread / 270 mg  1 tbs jelly / 6 mg   cup red grapes / 2 mg Dinner / Sodium (mg)  1 cup whole-wheat pasta / 2 mg  1 cup low-sodium tomato sauce / 73 mg  3 oz lean ground beef / 57 mg  1 small side salad (1 cup raw spinach  leaves,  cup cucumber,  cup yellow bell pepper) with 1 tsp olive oil and 1 tsp red wine vinegar / 25 mg Snack / Sodium (mg)  1 container low-fat vanilla yogurt / 107 mg  3 graham cracker squares / 127 mg Nutrient Analysis  Calories: 1745  Protein: 75 g  Carbohydrate: 237 g  Fat: 57 g  Sodium: 1425 mg Document Released: 10/16/2005 Document Revised: 01/08/2012 Document Reviewed: 01/17/2010 ExitCare Patient Information 2014 [place], [place].

## 2013-09-18 NOTE — Progress Notes (Signed)
Kenneth [name] Date of Birth: 11-Mar-1946 Medical Record #[id number]  History of Present Illness: Kenneth [name] is seen as a work in. He is a 67 year old white male with long-[name] history of coronary disease. He had angioplasty 1999. He was admitted in February 2014 with unstable angina. Cardiac catheterization at that time demonstrated severe disease in the right coronary and in the LAD. The right coronary was felt to be the culprit and was treated with 3 Promus  drug-eluting stents. FFR of the LAD was abnormal and 0.71. The patient returned in March for staged stenting of the LAD. On his initial evaluation cardiac catheterization demonstrated an ejection fraction of 40%. An echocardiogram obtained the same day indicated an ejection fraction of 55-60%. Patient had no prior history of congestive heart failure. He was [facility] to have [facility] with [facility]. He states he has done well and was exercising regularly up until last week. He subsequently developed acute shortness of breath associated with chest tightness. His weight went up 12 pounds in one week. He was seen by his primary care. BNP was elevated and chest x-ray reportedly showed cardiomegaly with pulmonary edema. He was placed on Lasix. He had a good diuresis with return to his baseline weight. Over the weekend he actually had increased shortness of breath and was seen again on Monday. At that time he was placed on antibiotics for a bronchitis. He states he is gradually getting better. His breathing is better. He has no edema. He denies any significant chest pain. He reports he was switched from pravastatin to Crestor because his LDL was night goal. He complains of muscle cramps on Crestor. He still smokes an occasional cigar.  Current Outpatient Prescriptions on File Prior to Visit  Medication Sig Dispense Refill  . aspirin 81 MG chewable tablet Chew 81 mg by mouth daily.       . cetirizine (ZYRTEC) 10 MG tablet Take 10 mg by mouth daily.      . fluticasone (FLONASE) 50 MCG/ACT nasal spray Place 2 sprays into the nose daily.  16 g  3  . lisinopril (PRINIVIL,ZESTRIL) 40 MG tablet Take 0.5 tablets (20 mg total) by mouth daily.  30 tablet  3  . Multiple Vitamin (MULTIVITAMIN WITH MINERALS) TABS Take 1 tablet by mouth daily. Centrum Silver      . nitroGLYCERIN (NITROSTAT) 0.4 MG SL tablet Place 1 tablet (0.4 mg total) under the tongue every 5 (five) minutes x 3 doses as needed for chest pain.  60 tablet  3   No current facility-administered medications on file prior to visit.    No Known Allergies  Past Medical History  Diagnosis Date  . Hypertension   . Hyperlipidemia   . Tobacco abuse   . Coronary artery disease     a. 1999: s/p PTCA;  b. 11/2012 Cath/PCI: LM mild plaque, LAD 60-37m, LCX 30p, 73m, OM1 77m, RCA 30-40ost, 40-32m, 80-19m/d (3.0x38, 3.0x32, 3.0x24 Promus Premier DESs), 70-80d, EF 40% inf HK;  c. 12/2012 Staged PCI of LAD w/ 3.0x12 Promus Premier DES.   . 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 Arthritis     Past Surgical History  Procedure Laterality Date  . Coronary angioplasty  1999  . Coronary angioplasty with stent placement  01/14/2013    DES to LAD    Dr Emalyn Schou [place]  . Hernia repair  2004 ?  Marland Kitchen  Carpel tunnel Left     History  Smoking status  . Current Every Day Smoker -- 15 years  . Types: Cigars  Smokeless tobacco  . Never Used    History  Alcohol Use  . Yes    Comment: 6-pack of beer/weekend    Family History  Problem Relation Age of Onset  . Arrhythmia Sister     s/p PPM    Review of Systems: As noted in history of present illness.  All other systems were reviewed and are negative.  Physical Exam: BP 136/89  Pulse 64  Ht 5\' 8"  (1.727 m)  Wt 218 lb (98.884 kg)  BMI 33.15 kg/m2 He is a pleasant white male in no acute distress. HEENT: Normal Neck: JVD to 3-4 cm. No bruits. No adenopathy or  thyromegaly. Lungs: Bilateral coarse wheezes right greater than left. No rales. Cardiovascular: Regular rate and rhythm. Normal S1 and S2. No gallop, murmur, or click. Abdomen: Soft and nontender. No masses or hepatosplenomegaly. Bowel sounds are positive. Extremities: No cyanosis or edema. Pulses are 2+ and symmetric. Skin: Warm and dry Neuro: Alert and oriented x3. Cranial nerves II through XII are intact.  LABORATORY DATA: Obtained from the primary care office glucose 123, BUN 19, creatinine 1.1. CBC was normal. BNP level was elevated at 418. It later declined to 279. Chest x-ray report is unavailable.  Assessment / Plan: 1. Acute congestive heart failure. Probably combined systolic and diastolic dysfunction. Evaluation in February with cardiac catheterization and echo demonstrated discrepant results. Agree with current diuretic therapy. We'll continue ACE inhibitor. I recommended switching atenolol to carvedilol 6.25 mg twice a day. He does appear to have a rhonchi his component to his symptoms as well and will complete his course of antibiotics. I'll followup again in 2 weeks. Once his breathing has improved we will consider followup stress testing and/or echocardiogram. I have recommended a low-sodium diet.  2. Coronary disease status post complex stenting of the right coronary and of the LAD in February and March of 2014. Patient is committed to dual antiplatelet therapy for at least one year.  3. Hyperlipidemia. Currently on Crestor. He is experiencing myalgias but is willing to continue the medication for now.  4. Hypertension-controlled.

## 2013-10-02 ENCOUNTER — Encounter: Payer: Self-pay | Admitting: Cardiology

## 2013-10-02 ENCOUNTER — Ambulatory Visit (INDEPENDENT_AMBULATORY_CARE_PROVIDER_SITE_OTHER): Payer: Medicare PPO | Admitting: Cardiology

## 2013-10-02 VITALS — BP 140/80 | HR 68 | Ht 68.0 in | Wt 217.0 lb

## 2013-10-02 DIAGNOSIS — I2510 Atherosclerotic heart disease of native coronary artery without angina pectoris: Secondary | ICD-10-CM

## 2013-10-02 DIAGNOSIS — I10 Essential (primary) hypertension: Secondary | ICD-10-CM

## 2013-10-02 DIAGNOSIS — E785 Hyperlipidemia, unspecified: Secondary | ICD-10-CM

## 2013-10-02 DIAGNOSIS — I5043 Acute on chronic combined systolic (congestive) and diastolic (congestive) heart failure: Secondary | ICD-10-CM

## 2013-10-02 DIAGNOSIS — Z9861 Coronary angioplasty status: Secondary | ICD-10-CM

## 2013-10-02 DIAGNOSIS — I509 Heart failure, unspecified: Secondary | ICD-10-CM

## 2013-10-02 NOTE — Progress Notes (Signed)
Kenneth [name] Date of Birth: [birth year] Medical Record #[id number]  History of Present Illness: Kenneth [name] is seen for followup. He has a long-[name] history of coronary disease. He had angioplasty 1999. He was admitted in February 2014 with unstable angina. Cardiac catheterization at that time demonstrated severe disease in the right coronary and in the LAD. The right coronary was felt to be the culprit and was treated with 3 Promus  drug-eluting stents. FFR of the LAD was abnormal and 0.71. The patient returned in March for staged stenting of the LAD. On his initial evaluation cardiac catheterization demonstrated an ejection fraction of 40%. An echocardiogram obtained the same day indicated an ejection fraction of 55-60%. In early November he presented with symptoms of increasing dyspnea, orthopnea, and chest tightness. He had significant weight gain. Evaluation was consistent with new onset of congestive heart failure. He was treated with diuresis and also given antibiotics for possible bronchitis. On followup today he is doing much better. He denies any significant dyspnea or orthopnea. He has no edema. With reduction in his Crestor to one half tablet a day his myalgias have resolved. He denies any chest pain.  Current Outpatient Prescriptions on File Prior to Visit  Medication Sig Dispense Refill  . amLODipine (NORVASC) 10 MG tablet Take 5 mg by mouth daily.      Marland Kitchen aspirin 81 MG chewable tablet Chew 81 mg by mouth daily.      . carvedilol (COREG) 6.25 MG tablet Take 1 tablet (6.25 mg total) by mouth 2 (two) times daily.  180 tablet  3  . cetirizine (ZYRTEC) 10 MG tablet Take 10 mg by mouth as needed.       . clopidogrel (PLAVIX) 75 MG tablet Take 75 mg by mouth daily with breakfast.      . ezetimibe (ZETIA) 10 MG tablet Take 10 mg by mouth daily.      . furosemide (LASIX) 40 MG tablet Take 40 mg by mouth.      Marland Kitchen lisinopril (PRINIVIL,ZESTRIL) 40 MG tablet Take 0.5 tablets (20 mg total) by  mouth daily.  30 tablet  3  . Multiple Vitamin (MULTIVITAMIN WITH MINERALS) TABS Take 1 tablet by mouth daily. Centrum Silver      . nitroGLYCERIN (NITROSTAT) 0.4 MG SL tablet Place 1 tablet (0.4 mg total) under the tongue every 5 (five) minutes x 3 doses as needed for chest pain.  60 tablet  3  . rosuvastatin (CRESTOR) 10 MG tablet Take 10 mg by mouth daily. 1/2 tab       No current facility-administered medications on file prior to visit.    No Known Allergies  Past Medical History  Diagnosis Date  . Hypertension   . Hyperlipidemia   . Tobacco abuse   . Coronary artery disease     a. 1999: s/p PTCA;  b. 11/2012 Cath/PCI: LM mild plaque, LAD 60-33m, LCX 30p, 15m, OM1 63m, RCA 30-40ost, 40-6m, 80-23m/d (3.0x38, 3.0x32, 3.0x24 Promus Premier DESs), 70-80d, EF 40% inf HK;  c. 12/2012 Staged PCI of LAD w/ 3.0x12 Promus Premier DES.   . 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 Arthritis   . Acute on chronic combined systolic and diastolic CHF (congestive heart failure) 09/18/2013    Past Surgical History  Procedure Laterality Date  . Coronary angioplasty  1999  . Coronary angioplasty with stent placement  01/14/2013    DES to LAD    Dr Leslyn Monda [place]  .  Hernia repair  2004 ?  Marland Kitchen Carpel tunnel Left     History  Smoking status  . Former Smoker -- 15 years  . Types: Cigars  . Quit date: 09/02/2013  Smokeless tobacco  . Never Used    History  Alcohol Use  . Yes    Comment: 6-pack of beer/weekend    Family History  Problem Relation Age of Onset  . Arrhythmia Sister     s/p PPM    Review of Systems: As noted in history of present illness.  All other systems were reviewed and are negative.  Physical Exam: BP 140/80  Pulse 68  Ht 5\' 8"  (1.727 m)  Wt 217 lb (98.431 kg)  BMI 33.00 kg/m2 He is a pleasant white male in no acute distress. HEENT: Normal Neck: JVD  is not elevated. No bruits. No adenopathy or thyromegaly. Lungs:  clear  Cardiovascular:  Regular rate and rhythm. Normal S1 and S2. No gallop, murmur, or click. Abdomen: Soft and nontender. No masses or hepatosplenomegaly. Bowel sounds are positive. Extremities: No cyanosis or edema. Pulses are 2+ and symmetric. Skin: Warm and dry Neuro: Alert and oriented x3. Cranial nerves II through XII are intact.  LABORATORY DATA:   Assessment / Plan: 1. Acute congestive heart failure. Probably combined systolic and diastolic dysfunction. Evaluation in February with cardiac catheterization and echo demonstrated discrepant results. We'll continue with his current therapy including Lasix, lisinopril, and carvedilol. We will schedule him for a followup echocardiogram.   Continue  a low-sodium diet.  If his echocardiogram looks okay I will followup again in 4 months.  2. Coronary disease status post complex stenting of the right coronary and of the LAD in February and March of 2014. Patient is committed to dual antiplatelet therapy for at least one year.  3. Hyperlipidemia. Currently on Crestor at a lower dose. Myalgias have resolved.  4. Hypertension-controlled.

## 2013-10-02 NOTE — Patient Instructions (Signed)
Continue your current medications and diet.  We will schedule you for an echocardiogram  I will plan on seeing you in 4 months

## 2013-10-13 ENCOUNTER — Telehealth: Payer: Self-pay

## 2013-10-13 ENCOUNTER — Ambulatory Visit (INDEPENDENT_AMBULATORY_CARE_PROVIDER_SITE_OTHER): Payer: Medicare PPO | Admitting: Gastroenterology

## 2013-10-13 ENCOUNTER — Encounter: Payer: Self-pay | Admitting: Gastroenterology

## 2013-10-13 VITALS — BP 110/70 | HR 70 | Ht 67.32 in | Wt 218.1 lb

## 2013-10-13 DIAGNOSIS — R195 Other fecal abnormalities: Secondary | ICD-10-CM

## 2013-10-13 DIAGNOSIS — I2510 Atherosclerotic heart disease of native coronary artery without angina pectoris: Secondary | ICD-10-CM

## 2013-10-13 MED ORDER — PEG-KCL-NACL-NASULF-NA ASC-C 100 G PO SOLR: 1.0000 | Freq: Once | ORAL | Status: DC

## 2013-10-13 NOTE — Telephone Encounter (Signed)
Patient notified to come off Plavix 5 days before his procedure and to stay on ASA. Pt verbalized understanding.

## 2013-10-13 NOTE — Patient Instructions (Addendum)
You have been scheduled for a colonoscopy with propofol. Please follow written instructions given to you at your visit today.  Please pick up your prep kit at the pharmacy within the next 1-3 days. If you use inhalers (even only as needed), please bring them with you on the day of your procedure.  Thank you for choosing me and [facility].  Venita Lick. Pleas Koch., MD., Clementeen Graham  cc: Alysia Penna, MD

## 2013-10-13 NOTE — Progress Notes (Addendum)
    History of Present Illness: This is a 67 year old male was recently found to have occult blood in the stool. He is noted a slight change in bowel habits with firmer stools and occasionally less frequent stools. He states he underwent colon[place] 2005 or 2006 in [place] and he does not recall any abnormal findings. Patient had 3 coronary artery stents placed in February 2014 and he was placed on Plavix. He has congestive heart failure with an EF of 40%. Denies weight loss, abdominal pain, constipation, diarrhea, change in stool caliber, melena, hematochezia, nausea, vomiting, dysphagia, reflux symptoms, chest pain.  Review of Systems: Pertinent positive and negative review of systems were noted in the above HPI section. All other review of systems were otherwise negative.  Current Medications, Allergies, Past Medical History, Past Surgical History, Family History and Social History were reviewed in [employer] record.  Physical Exam: General: Well developed , well nourished, no acute distress Head: Normocephalic and atraumatic Eyes:  sclerae anicteric, EOMI Ears: Normal auditory acuity Mouth: No deformity or lesions Neck: Supple, no masses or thyromegaly Lungs: Clear throughout to auscultation Heart: Regular rate and rhythm; no murmurs, rubs or bruits Abdomen: Soft, non tender and non distended. No masses, hepatosplenomegaly or hernias noted. Normal Bowel sounds Rectal: No lesions, soft brown Hemoccult negative stool the vault Musculoskeletal: Symmetrical with no gross deformities  Skin: No lesions on visible extremities Pulses:  Normal pulses noted Extremities: No clubbing, cyanosis, edema or deformities noted Neurological: Alert oriented x 4, grossly nonfocal Cervical Nodes:  No significant cervical adenopathy Inguinal Nodes: No significant inguinal adenopathy Psychological:  Alert and cooperative. Normal mood and affect  Assessment and Recommendations:  1.  Occult blood in stool. Slight change in bowel habits. Rule out colorectal neoplasms, hemorrhoids and other disorders. Schedule colonoscopy. The risks, benefits, and alternatives to colonoscopy with possible biopsy and possible polypectomy were discussed with the patient and they consent to proceed.   2. Coronary artery disease 3 stents placed in February 2011. Recommended a 5 day hold Plavix prior to colonoscopy will obtain clearance from Dr. Swaziland but perhaps clearance will be delayed after a full year Plavix. He may need to proceed with colonoscopy while on Plavix.  10/22/13. Records were received and reviewed from Dr. Lorelle Formosa at [facility]. Colonoscopy performed 12/08/2008 for screening showed mild scattered diverticulosis and small internal hemorrhoids.

## 2013-10-13 NOTE — Telephone Encounter (Signed)
10/13/2013   RE: Kenneth [name] DOB: [birth year] MRN: [id number]   Dear Dr. Swaziland,    We have scheduled the above patient for an endoscopic procedure. Our records show that he is on anticoagulation therapy.   Please advise as to how long the patient may come off his therapy of Plavix prior to the procedure, which is scheduled for 12/02/13.  Please fax back/ or route the completed form to [place] at [phone].   Sincerely,    Christie Nottingham, CMA

## 2013-10-13 NOTE — Telephone Encounter (Signed)
He may stop Plavix for 5 days before endoscopic procedure. He should remain on ASA.  Peter Swaziland MD, [facility]

## 2013-10-14 ENCOUNTER — Encounter: Payer: Self-pay | Admitting: Gastroenterology

## 2013-10-20 ENCOUNTER — Encounter: Payer: Self-pay | Admitting: Cardiology

## 2013-10-20 ENCOUNTER — Ambulatory Visit (HOSPITAL_COMMUNITY): Payer: Medicare PPO | Attending: Cardiology | Admitting: Radiology

## 2013-10-20 DIAGNOSIS — E785 Hyperlipidemia, unspecified: Secondary | ICD-10-CM

## 2013-10-20 DIAGNOSIS — I5042 Chronic combined systolic (congestive) and diastolic (congestive) heart failure: Secondary | ICD-10-CM | POA: Insufficient documentation

## 2013-10-20 DIAGNOSIS — Z9861 Coronary angioplasty status: Secondary | ICD-10-CM | POA: Insufficient documentation

## 2013-10-20 DIAGNOSIS — I5043 Acute on chronic combined systolic (congestive) and diastolic (congestive) heart failure: Secondary | ICD-10-CM

## 2013-10-20 DIAGNOSIS — I2510 Atherosclerotic heart disease of native coronary artery without angina pectoris: Secondary | ICD-10-CM | POA: Insufficient documentation

## 2013-10-20 DIAGNOSIS — I509 Heart failure, unspecified: Secondary | ICD-10-CM | POA: Insufficient documentation

## 2013-10-20 DIAGNOSIS — I10 Essential (primary) hypertension: Secondary | ICD-10-CM | POA: Insufficient documentation

## 2013-10-20 NOTE — Progress Notes (Signed)
Echocardiogram performed.  

## 2013-10-29 ENCOUNTER — Telehealth: Payer: Self-pay

## 2013-10-29 NOTE — Telephone Encounter (Signed)
Spoke with Malachi Bonds and told her due to new changes in [facility] Choice plan that all PCP's have to give either fax [place] a hand written referral or have patient bring it in to the office day of procedure or office visit. She states she was aware of this change but did not know it form had to be handwritten. She states she will call [place] care management plan to initiate the referral for this patient but does not have the form to fill out. Faxed the form for [place] [facility] care management and the patient's snap shot with patient's demographic information. Malachi Bonds states she will take care of this and call patient after she receives the approval.

## 2013-12-02 ENCOUNTER — Ambulatory Visit (AMBULATORY_SURGERY_CENTER): Payer: Medicare PPO | Admitting: Gastroenterology

## 2013-12-02 ENCOUNTER — Encounter: Payer: Self-pay | Admitting: Gastroenterology

## 2013-12-02 VITALS — BP 125/76 | HR 59 | Temp 96.5°F | Resp 14 | Ht 67.0 in | Wt 218.0 lb

## 2013-12-02 DIAGNOSIS — R198 Other specified symptoms and signs involving the digestive system and abdomen: Secondary | ICD-10-CM

## 2013-12-02 DIAGNOSIS — D126 Benign neoplasm of colon, unspecified: Secondary | ICD-10-CM

## 2013-12-02 DIAGNOSIS — K635 Polyp of colon: Secondary | ICD-10-CM

## 2013-12-02 DIAGNOSIS — R195 Other fecal abnormalities: Secondary | ICD-10-CM

## 2013-12-02 HISTORY — DX: Polyp of colon: K63.5

## 2013-12-02 MED ORDER — SODIUM CHLORIDE 0.9 % IV SOLN: 500.0000 mL | INTRAVENOUS | Status: DC

## 2013-12-02 NOTE — Progress Notes (Signed)
Called to room to assist during endoscopic procedure.  Patient ID and intended procedure confirmed with present staff. Received instructions for my participation in the procedure from the performing physician.  

## 2013-12-02 NOTE — Patient Instructions (Signed)
Discharge instructions given with verbal understanding. Handouts on polyps,diverticulosis and hemorrhoids. Resume previous medications. YOU HAD AN ENDOSCOPIC PROCEDURE TODAY AT THE [facility]: Refer to the procedure report that was given to you for any specific questions about what was found during the examination.  If the procedure report does not answer your questions, please call your gastroenterologist to clarify.  If you requested that your care partner not be given the details of your procedure findings, then the procedure report has been included in a sealed envelope for you to review at your convenience later.  YOU SHOULD EXPECT: Some feelings of bloating in the abdomen. Passage of more gas than usual.  Walking can help get rid of the air that was put into your GI tract during the procedure and reduce the bloating. If you had a lower endoscopy (such as a colonoscopy or flexible sigmoidoscopy) you may notice spotting of blood in your stool or on the toilet paper. If you underwent a bowel prep for your procedure, then you may not have a normal bowel movement for a few days.  DIET: Your first meal following the procedure should be a light meal and then it is ok to progress to your normal diet.  A half-sandwich or bowl of soup is an example of a good first meal.  Heavy or fried foods are harder to digest and may make you feel nauseous or bloated.  Likewise meals heavy in dairy and vegetables can cause extra gas to form and this can also increase the bloating.  Drink plenty of fluids but you should avoid alcoholic beverages for 24 hours.  ACTIVITY: Your care partner should take you home directly after the procedure.  You should plan to take it easy, moving slowly for the rest of the day.  You can resume normal activity the day after the procedure however you should NOT DRIVE or use heavy machinery for 24 hours (because of the sedation medicines used during the test).    SYMPTOMS TO REPORT  IMMEDIATELY: A gastroenterologist can be reached at any hour.  During normal business hours, 8:30 AM to 5:00 PM Monday through Friday, call [phone].  After hours and on weekends, please call the GI answering service at [phone] who will take a message and have the physician on call contact you.   Following lower endoscopy (colonoscopy or flexible sigmoidoscopy):  Excessive amounts of blood in the stool  Significant tenderness or worsening of abdominal pains  Swelling of the abdomen that is new, acute  Fever of 100F or higher  FOLLOW UP: If any biopsies were taken you will be contacted by phone or by letter within the next 1-3 weeks.  Call your gastroenterologist if you have not heard about the biopsies in 3 weeks.  Our staff will call the home number listed on your records the next business day following your procedure to check on you and address any questions or concerns that you may have at that time regarding the information given to you following your procedure. This is a courtesy call and so if there is no answer at the home number and we have not heard from you through the emergency physician on call, we will assume that you have returned to your regular daily activities without incident.  SIGNATURES/CONFIDENTIALITY: You and/or your care partner have signed paperwork which will be entered into your electronic medical record.  These signatures attest to the fact that that the information above on your After Visit Summary   has been reviewed and is understood.  Full responsibility of the confidentiality of this discharge information lies with you and/or your care-partner. 

## 2013-12-02 NOTE — Progress Notes (Signed)
A/ox3 pleased with MAC, report to Celia RN 

## 2013-12-02 NOTE — Op Note (Signed)
[place]  [address] [place], [id number]   COLONOSCOPY PROCEDURE REPORT PATIENT: Kenneth [name], Kenneth [name]  MR#: [id number] BIRTHDATE: 1946-07-22 , 59  yrs. old GENDER: Male ENDOSCOPIST: Ladene Artist, MD, [facility] REFERRED OE:VOJJK Ardeth Perfect, M.D. PROCEDURE DATE:  12/02/2013 PROCEDURE:   Colonoscopy with snare polypectomy First Screening Colonoscopy - Avg.  risk and is 50 yrs.  old or older - No.  Prior Negative Screening - Now for repeat screening. N/A  History of Adenoma - Now for follow-up colonoscopy & has been > or = to 3 yrs.  N/A  Polyps Removed Today? Yes. ASA CLASS:   Class II INDICATIONS:heme-positive stool and change in bowel habits. MEDICATIONS: MAC sedation, administered by CRNA and propofol (Diprivan) 350mg  IV DESCRIPTION OF PROCEDURE:   After the risks benefits and alternatives of the procedure were thoroughly explained, informed consent was obtained.  A digital rectal exam revealed no abnormalities of the rectum.   The LB [id number]  endoscope was introduced through the anus and advanced to the cecum, which was identified by both the appendix and ileocecal valve. No adverse events experienced.   The quality of the prep was excellent, using MoviPrep  The instrument was then slowly withdrawn as the colon was fully examined.  COLON FINDINGS: Two sessile polyps measuring 5-6 mm in size were found in the descending colon and sigmoid colon.  A polypectomy was performed with a cold snare.  The resection was complete and the polyp tissue was completely retrieved.   A semi-pedunculated polyp measuring 8 mm in size was found in the sigmoid colon.  A polypectomy was performed using snare cautery.  The resection was complete and the polyp tissue was completely retrieved.   Mild diverticulosis was noted in the sigmoid colon.   The colon was otherwise normal.  There was no diverticulosis, inflammation, polyps or cancers unless previously stated.  Retroflexed  views revealed small internal hemorrhoids. The time to cecum=2 minutes 32 seconds.  Withdrawal time=11 minutes 06 seconds.  The scope was withdrawn and the procedure completed. COMPLICATIONS: There were no complications. ENDOSCOPIC IMPRESSION: 1.   Two sessile polyps measuring 5-6 mm in the descending and sigmoid colon; polypectomy performed with a cold snare 2.   Semi-pedunculated polyp measuring 8 mm in the sigmoid colon; polypectomy performed using snare cautery 3.   Mild diverticulosis in the sigmoid colon 4.   Small internal hemorrhoids  RECOMMENDATIONS: 1.  Hold aspirin, aspirin products, and anti-inflammatory medication for 2 weeks. 2.  Await pathology results 3.  Repeat colonoscopy in 5 years if polyp(s) adenomatous; otherwise 10 years  eSigned:  Ladene Artist, MD, [facility] 12/02/2013 3:32 PM

## 2013-12-03 ENCOUNTER — Telehealth: Payer: Self-pay | Admitting: *Deleted

## 2013-12-03 NOTE — Telephone Encounter (Signed)
  Follow up Call-  Call back number 12/02/2013  Post procedure Call Back phone  # [phone]  Permission to leave phone message Yes     Patient questions:  Do you have a fever, pain , or abdominal swelling? no Pain Score  0 *  Have you tolerated food without any problems? yes  Have you been able to return to your normal activities? yes  Do you have any questions about your discharge instructions: Diet   no Medications  no Follow up visit  no  Do you have questions or concerns about your Care? no  Actions: * If pain score is 4 or above: No action needed, pain <4.

## 2013-12-09 ENCOUNTER — Encounter: Payer: Self-pay | Admitting: Gastroenterology

## 2014-01-15 ENCOUNTER — Telehealth: Payer: Self-pay | Admitting: Cardiology

## 2014-01-15 NOTE — Telephone Encounter (Signed)
Returned call to patient he stated he never got a reminder letter to schedule appointment with Dr.Jordan in April.Stated he was told next appointment would be in June.Follow up appointment scheduled with Dr.Jordan 02/11/14 at 2:45 pm.

## 2014-01-15 NOTE — Telephone Encounter (Signed)
New Prob   Pt has some questions regarding recall follow up for April. Please call.

## 2014-02-10 ENCOUNTER — Encounter: Payer: Self-pay | Admitting: Cardiology

## 2014-02-10 ENCOUNTER — Ambulatory Visit (INDEPENDENT_AMBULATORY_CARE_PROVIDER_SITE_OTHER): Payer: Commercial Managed Care - HMO | Admitting: Cardiology

## 2014-02-10 VITALS — BP 130/68 | HR 75 | Ht 67.0 in | Wt 222.4 lb

## 2014-02-10 DIAGNOSIS — I509 Heart failure, unspecified: Secondary | ICD-10-CM

## 2014-02-10 DIAGNOSIS — I10 Essential (primary) hypertension: Secondary | ICD-10-CM

## 2014-02-10 DIAGNOSIS — Z9861 Coronary angioplasty status: Secondary | ICD-10-CM

## 2014-02-10 DIAGNOSIS — I5042 Chronic combined systolic (congestive) and diastolic (congestive) heart failure: Secondary | ICD-10-CM

## 2014-02-10 DIAGNOSIS — E785 Hyperlipidemia, unspecified: Secondary | ICD-10-CM

## 2014-02-10 DIAGNOSIS — I2510 Atherosclerotic heart disease of native coronary artery without angina pectoris: Secondary | ICD-10-CM

## 2014-02-10 NOTE — Progress Notes (Signed)
[name] Date of Birth: 05/30/1946 Medical Record #[id number]  History of Present Illness: Kenneth [name] is seen for followup. He has a long-standing history of coronary disease. He had angioplasty 1999. He was admitted in February 2014 with unstable angina. Cardiac catheterization at that time demonstrated severe disease in the right coronary and in the LAD. The right coronary was felt to be the culprit and was treated with 3 Promus  drug-eluting stents. FFR of the LAD was abnormal and 0.71. The patient returned in March for staged stenting of the LAD. On his initial evaluation cardiac catheterization demonstrated an ejection fraction of 40%. An echocardiogram obtained the same day indicated an ejection fraction of 55-60%. In early November 2014 he presented with CHF.  He was treated with diuresis and also given antibiotics for possible bronchitis. On followup today he is doing well. He denies any chest pain or SOB. He did injure his knee during a fall and needs right TKR. He reports he cannot tolerate Crestor even at 5 mg daily. Severe myalgias and depression. Stopped crestor 2 weeks ago and feels like a new man. He had similar problems with lipitor in the past. With knee injury and inactivity he has gained 8 lbs.   Current Outpatient Prescriptions on File Prior to Visit  Medication Sig Dispense Refill  . amLODipine (NORVASC) 10 MG tablet Take 5 mg by mouth daily.      Marland Kitchen aspirin 81 MG chewable tablet Chew 81 mg by mouth daily.      . carvedilol (COREG) 6.25 MG tablet Take 1 tablet (6.25 mg total) by mouth 2 (two) times daily.  180 tablet  3  . cetirizine (ZYRTEC) 10 MG tablet Take 10 mg by mouth as needed.       . clopidogrel (PLAVIX) 75 MG tablet Take 75 mg by mouth daily with breakfast.      . ezetimibe (ZETIA) 10 MG tablet Take 10 mg by mouth daily.      . fluticasone (FLONASE) 50 MCG/ACT nasal spray Place 2 sprays into the nose as needed.      . furosemide (LASIX) 40 MG tablet Take 40 mg by  mouth.      Marland Kitchen lisinopril (PRINIVIL,ZESTRIL) 40 MG tablet Take 0.5 tablets (20 mg total) by mouth daily.  30 tablet  3  . Multiple Vitamin (MULTIVITAMIN WITH MINERALS) TABS Take 1 tablet by mouth daily. Centrum Silver      . nitroGLYCERIN (NITROSTAT) 0.4 MG SL tablet Place 1 tablet (0.4 mg total) under the tongue every 5 (five) minutes x 3 doses as needed for chest pain.  60 tablet  3   No current facility-administered medications on file prior to visit.    No Known Allergies  Past Medical History  Diagnosis Date  . Hypertension   . Hyperlipidemia   . Tobacco abuse   . Coronary artery disease     a. 1999: s/p PTCA;  b. 11/2012 Cath/PCI: LM mild plaque, LAD 60-13m, LCX 30p, 67m, OM1 57m, RCA 30-40ost, 40-28m, 80-10m/d (3.0x38, 3.0x32, 3.0x24 Promus Premier DESs), 70-80d, EF 40% inf HK;  c. 12/2012 Staged PCI of LAD w/ 3.0x12 Promus Premier DES.   . 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 Arthritis   . Acute on chronic combined systolic and diastolic CHF (congestive heart failure) 09/18/2013  . Squamous cell carcinoma     Back    Past Surgical History  Procedure Laterality Date  . Coronary  angioplasty  1999  . Coronary angioplasty with stent placement  01/14/2013    DES to LAD    Dr Peter Martinique  . Hernia repair  2004 ?  Marland Kitchen Carpal tunnel release Left   . Ankle surgery  1960    History  Smoking status  . Former Smoker -- 15 years  . Types: Cigars  . Quit date: 09/02/2013  Smokeless tobacco  . Never Used    History  Alcohol Use  . Yes    Comment: 6-pack of beer/weekend    Family History  Problem Relation Age of Onset  . Arrhythmia Sister     s/p PPM  . Alzheimer's disease Mother   . Hypertension Mother   . Diabetes Mother   . CVA Father     Review of Systems: As noted in history of present illness.  All other systems were reviewed and are negative.  Physical Exam: BP 130/68  Pulse 75  Ht 5\' 7"  (1.702 m)  Wt 222 lb 6.4 oz (100.88 kg)  BMI  34.82 kg/m2 He is a pleasant white male in no acute distress. HEENT: Normal Neck: JVD  is not elevated. No bruits. No adenopathy or thyromegaly. Lungs:  clear  Cardiovascular: Regular rate and rhythm. Normal S1 and S2. No gallop, murmur, or click. Abdomen: Soft and nontender. No masses or hepatosplenomegaly. Bowel sounds are positive. Extremities: No cyanosis or edema. Pulses are 2+ and symmetric. Skin: Warm and dry Neuro: Alert and oriented x3. Cranial nerves II through XII are intact.  LABORATORY DATA: Ecg: NSR, RAD, old inferior infarct.  Assessment / Plan: 1. Chronic combined systolic and diastolic congestive heart failure. EF 45-50%. We'll continue with his current therapy including Lasix, lisinopril, and carvedilol.   Continue  a low-sodium diet. Monitor weight.  2. Coronary disease status post complex stenting of the right coronary and of the LAD in February and March of 2014. Patient is committed to dual antiplatelet therapy for now. Given the extensive stenting of the RCA I would favor long term DAPT.  3. Hyperlipidemia. Statin intolerant. Continue Zetia. Will refer to lipid clinic. ? If he is a candidate for one of the PCSK 9 trials.   4. Hypertension-controlled.  5. Knee injury. He is cleared for Right TKR. He may stop Plavix 7 days prior to surgery. Continue ASA.

## 2014-02-10 NOTE — Patient Instructions (Signed)
We will schedule you an appointment in our lipid clinic  Continue your other therapy. Stop crestor.  I will see you in 4 months.

## 2014-02-12 ENCOUNTER — Ambulatory Visit (INDEPENDENT_AMBULATORY_CARE_PROVIDER_SITE_OTHER): Payer: Commercial Managed Care - HMO | Admitting: Pharmacist

## 2014-02-12 VITALS — Wt 222.0 lb

## 2014-02-12 DIAGNOSIS — E785 Hyperlipidemia, unspecified: Secondary | ICD-10-CM

## 2014-02-12 NOTE — Assessment & Plan Note (Signed)
Discussed SPIRE-2 clinical trial at length with patient.  He understands it is a blinded, placebo controlled study, using a SQ medication, and wishes to proceed.  I have passed his information to the [employer].  He understands he may need to wait another 4 weeks to get screened since he has only been off Crestor for 2 weeks, and this needs to "wash out" further.  Patient excited for opportunity.

## 2014-02-12 NOTE — Progress Notes (Signed)
Patient is a pleasant 68 y.o. WM referred to lipid clinic by Dr. Martinique to see if he is a candidate for SPIRE-2 clinical trial.  Patient has a h/o CAD / PCI x 3 in 11/2012, HTN, diabetes, and CHF.  He has failed multiple statins due to muscle soreness, and most recently failed Crestor 5 mg once daily (1/2 of 10 mg pills).  Patient stopped Crestor two weeks ago due to severe muscle aches and weakness, and now everyday is feeling less aches in his muscles.  His last LDL was 85 mg/dL in 11/2012, but was on statin at that time.  He is currently only taking Zetia 10 mg qd, and tolerating this well.  He is a former smoker, and drinks a few beers on the weekend.  He plays golf 3 times per week with a seniors group.   Patient doesn't have a recent history of cancer, and has no problems given an SQ injection.  He appears to qualify for the statin intolerant arm of SPIRE-2 for PCSK-9 inhibitor.  RF:  CAD 11/2012, HTN, Diabetes, age - LDL goal < 70 Meds:  Zetia 10 mg qd. Intolerant:  Zocor 20-40 mg qd, Lipitor  20 mg qd, Pravastatin 20-40 mg qd, Crestor 5 mg qd (1/2 of 10 mg pill daily)  Labs 11/2012:  LDL 85, TC 142, HDL 34, TG 116 (was on statin daily at that time).  No longer on statin now.  Current Outpatient Prescriptions  Medication Sig Dispense Refill  . amLODipine (NORVASC) 10 MG tablet Take 5 mg by mouth daily.      Marland Kitchen aspirin 81 MG chewable tablet Chew 81 mg by mouth daily.      . carvedilol (COREG) 6.25 MG tablet Take 1 tablet (6.25 mg total) by mouth 2 (two) times daily.  180 tablet  3  . cetirizine (ZYRTEC) 10 MG tablet Take 10 mg by mouth as needed.       . clopidogrel (PLAVIX) 75 MG tablet Take 75 mg by mouth daily with breakfast.      . ezetimibe (ZETIA) 10 MG tablet Take 10 mg by mouth daily.      . fluticasone (FLONASE) 50 MCG/ACT nasal spray Place 2 sprays into the nose as needed.      . furosemide (LASIX) 40 MG tablet Take 40 mg by mouth.      Marland Kitchen lisinopril (PRINIVIL,ZESTRIL) 40 MG tablet Take  0.5 tablets (20 mg total) by mouth daily.  30 tablet  3  . Multiple Vitamin (MULTIVITAMIN WITH MINERALS) TABS Take 1 tablet by mouth daily. Centrum Silver      . nitroGLYCERIN (NITROSTAT) 0.4 MG SL tablet Place 1 tablet (0.4 mg total) under the tongue every 5 (five) minutes x 3 doses as needed for chest pain.  60 tablet  3   No current facility-administered medications for this visit.   Allergies  Allergen Reactions  . Crestor [Rosuvastatin]     Muscle aches at Crestor 5 mg daily  . Lipitor [Atorvastatin]     Muscle aches at 20 mg daily   . Pravastatin Sodium     Muscle aches at 20 mg and 40 mg daily  . Zocor [Simvastatin-High Dose]     Muscle aches at 20 mg and 40 mg daily   Family History  Problem Relation Age of Onset  . Arrhythmia Sister     s/p PPM  . Alzheimer's disease Mother   . Hypertension Mother   . Diabetes Mother   . CVA  Father

## 2014-03-13 ENCOUNTER — Telehealth: Payer: Self-pay | Admitting: Cardiology

## 2014-03-13 NOTE — Telephone Encounter (Signed)
Received request from Nurse fax box, documents faxed for surgical clearance. °To:[facility]  °Fax number: [phone] °Attention: °[phone]/kdm °

## 2014-05-21 ENCOUNTER — Other Ambulatory Visit: Payer: Self-pay | Admitting: Orthopedic Surgery

## 2014-05-26 ENCOUNTER — Telehealth: Payer: Self-pay | Admitting: Cardiology

## 2014-05-26 ENCOUNTER — Encounter (HOSPITAL_COMMUNITY): Payer: Self-pay | Admitting: Pharmacy Technician

## 2014-05-26 NOTE — Telephone Encounter (Signed)
Returned call to patient he stated he needed follow up visit with Dr.Jordan.Appointment scheduled 07/09/14 at 4:30 pm.

## 2014-05-26 NOTE — Telephone Encounter (Signed)
New message     Talk to a nurse about getting an appt to see Dr Thornell Sartorius, he did not want to schedule one now, he want to talk to the nure first

## 2014-06-01 ENCOUNTER — Encounter (HOSPITAL_COMMUNITY): Payer: Self-pay

## 2014-06-01 ENCOUNTER — Encounter (HOSPITAL_COMMUNITY)
Admission: RE | Admit: 2014-06-01 | Discharge: 2014-06-01 | Disposition: A | Discharge status: Home / Self Care | Payer: Medicare HMO | Source: Ambulatory Visit | Attending: Orthopedic Surgery | Admitting: Orthopedic Surgery

## 2014-06-01 DIAGNOSIS — Z01818 Encounter for other preprocedural examination: Secondary | ICD-10-CM | POA: Insufficient documentation

## 2014-06-01 DIAGNOSIS — Z01812 Encounter for preprocedural laboratory examination: Secondary | ICD-10-CM | POA: Insufficient documentation

## 2014-06-01 HISTORY — DX: Allergy status to unspecified drugs, medicaments and biological substances: Z88.9

## 2014-06-01 HISTORY — DX: Hyperuricemia without signs of inflammatory arthritis and tophaceous disease: E79.0

## 2014-06-01 HISTORY — DX: Acute myocardial infarction, unspecified: I21.9

## 2014-06-01 LAB — URINALYSIS, ROUTINE W REFLEX MICROSCOPIC
Bilirubin Urine: NEGATIVE
Glucose, UA: NEGATIVE mg/dL
Hgb urine dipstick: NEGATIVE
Ketones, ur: NEGATIVE mg/dL
Leukocytes, UA: NEGATIVE
Nitrite: NEGATIVE
PROTEIN: NEGATIVE mg/dL
Specific Gravity, Urine: 1.007 (ref 1.005–1.030)
UROBILINOGEN UA: 0.2 mg/dL (ref 0.0–1.0)
pH: 5 (ref 5.0–8.0)

## 2014-06-01 LAB — PROTIME-INR
INR: 1.09 (ref 0.00–1.49)
PROTHROMBIN TIME: 14.1 s (ref 11.6–15.2)

## 2014-06-01 LAB — COMPREHENSIVE METABOLIC PANEL
ALBUMIN: 4 g/dL (ref 3.5–5.2)
ALT: 24 U/L (ref 0–53)
ANION GAP: 12 (ref 5–15)
AST: 34 U/L (ref 0–37)
Alkaline Phosphatase: 61 U/L (ref 39–117)
BILIRUBIN TOTAL: 0.3 mg/dL (ref 0.3–1.2)
BUN: 27 mg/dL — AB (ref 6–23)
CALCIUM: 9.5 mg/dL (ref 8.4–10.5)
CO2: 23 mEq/L (ref 19–32)
CREATININE: 1.07 mg/dL (ref 0.50–1.35)
Chloride: 100 mEq/L (ref 96–112)
GFR calc Af Amer: 82 mL/min — ABNORMAL LOW (ref >90)
GFR calc non Af Amer: 70 mL/min — ABNORMAL LOW (ref >90)
Glucose, Bld: 117 mg/dL — ABNORMAL HIGH (ref 70–99)
Potassium: 5.2 mEq/L (ref 3.7–5.3)
Sodium: 135 mEq/L — ABNORMAL LOW (ref 137–147)
Total Protein: 8.1 g/dL (ref 6.0–8.3)

## 2014-06-01 LAB — SURGICAL PCR SCREEN
MRSA, PCR: NEGATIVE
STAPHYLOCOCCUS AUREUS: NEGATIVE

## 2014-06-01 LAB — CBC
HEMATOCRIT: 39.4 % (ref 39.0–52.0)
Hemoglobin: 13.9 g/dL (ref 13.0–17.0)
MCH: 31.4 pg (ref 26.0–34.0)
MCHC: 35.3 g/dL (ref 30.0–36.0)
MCV: 89.1 fL (ref 78.0–100.0)
Platelets: 213 10*3/uL (ref 150–400)
RBC: 4.42 MIL/uL (ref 4.22–5.81)
RDW: 13 % (ref 11.5–15.5)
WBC: 5.8 10*3/uL (ref 4.0–10.5)

## 2014-06-01 LAB — APTT: aPTT: 33 seconds (ref 24–37)

## 2014-06-01 NOTE — Pre-Procedure Instructions (Signed)
06-01-14 EKG 4'15, CXR 10'14 Epic. Clearance note (Dr. Martinique)- 02-12-14 with chart.

## 2014-06-01 NOTE — Pre-Procedure Instructions (Signed)
06-01-14 Note to Dr. Hoover Brunette office Stop/Bang score = 5.

## 2014-06-01 NOTE — Patient Instructions (Addendum)
Kenneth [name]  06/01/2014   Your procedure is scheduled on:  8-10 -2015 Monday at [facility] through [facility] Entrance and follow signs to [place]. Arrive at    0515    AM.  Call this number if you have problems the morning of surgery: [phone]  Or Presurgical Testing [phone]([place]) For Living Will and/or Health Care Power Attorney Forms: please provide copy for your medical record,may bring AM of surgery(Forms should be already notarized -we do not provide this service).(06-01-14  No information preferred today).    Do not eat food:After Midnight.     Take these medicines the morning of surgery with A SIP OF WATER: Allopurinol. Amlodipine. Carvedilol.Continue Aspirin as per Dr. Martinique instruction. Bring / use Flonase. Eye drops as needed.   Do not wear jewelry, make-up or nail polish.  Do not wear lotions, powders, or perfumes. You may wear deodorant.  Do not shave 48 hours(2 days) prior to first CHG shower(legs and under arms).(Shaving face and neck okay.)  Do not bring valuables to the hospital.(Hospital is not responsible for lost valuables).  Contacts, dentures or removable bridgework, body piercing, hair pins may not be worn into surgery.  Leave suitcase in the car. After surgery it may be brought to your room.  For patients admitted to the hospital, checkout time is 11:00 AM the day of discharge.(Restricted visitors-Any Persons displaying flu-like symptoms or illness).    Patients discharged the day of surgery will not be allowed to drive home. Must have responsible person with you x 24 hours once discharged.  Name and phone number of your driver: orren, pietsch- [phone] cell.  Special Instructions: CHG(Chlorhedine 4%-"Hibiclens","Betasept","Aplicare") Shower Use Special Wash: see special instructions.(avoid face and genitals)   Please read over the following fact sheets that you were given: MRSA Information, Blood Transfusion fact  sheet, Incentive Spirometry Instruction.  Remember : Type/Screen "Blue armbands" - may not be removed once applied(would result in being retested AM of surgery, if removed).     _____________________    [facility] - Preparing for Surgery Before surgery, you can play an important role.  Because skin is not sterile, your skin needs to be as free of germs as possible.  You can reduce the number of germs on your skin by washing with CHG (chlorahexidine gluconate) soap before surgery.  CHG is an antiseptic cleaner which kills germs and bonds with the skin to continue killing germs even after washing. Please DO NOT use if you have an allergy to CHG or antibacterial soaps.  If your skin becomes reddened/irritated stop using the CHG and inform your nurse when you arrive at Short Stay. Do not shave (including legs and underarms) for at least 48 hours prior to the first CHG shower.  You may shave your face/neck. Please follow these instructions carefully:  1.  Shower with CHG Soap the night before surgery and the  morning of Surgery.  2.  If you choose to wash your hair, wash your hair first as usual with your  normal  shampoo.  3.  After you shampoo, rinse your hair and body thoroughly to remove the  shampoo.                           4.  Use CHG as you would any other liquid soap.  You can apply chg directly  to the skin and wash  Gently with a scrungie or clean washcloth.  5.  Apply the CHG Soap to your body ONLY FROM THE NECK DOWN.   Do not use on face/ open                           Wound or open sores. Avoid contact with eyes, ears mouth and genitals (private parts).                       Wash face,  Genitals (private parts) with your normal soap.             6.  Wash thoroughly, paying special attention to the area where your surgery  will be performed.  7.  Thoroughly rinse your body with warm water from the neck down.  8.  DO NOT shower/wash with your normal soap after using  and rinsing off  the CHG Soap.                9.  Pat yourself dry with a clean towel.            10.  Wear clean pajamas.            11.  Place clean sheets on your bed the night of your first shower and do not  sleep with pets. Day of Surgery : Do not apply any lotions/deodorants the morning of surgery.  Please wear clean clothes to the hospital/surgery center.  FAILURE TO FOLLOW THESE INSTRUCTIONS MAY RESULT IN THE CANCELLATION OF YOUR SURGERY PATIENT SIGNATURE_________________________________  NURSE SIGNATURE__________________________________  ________________________________________________________________________   Adam Phenix  An incentive spirometer is a tool that can help keep your lungs clear and active. This tool measures how well you are filling your lungs with each breath. Taking long deep breaths may help reverse or decrease the chance of developing breathing (pulmonary) problems (especially infection) following:  A long period of time when you are unable to move or be active. BEFORE THE PROCEDURE   If the spirometer includes an indicator to show your best effort, your nurse or respiratory therapist will set it to a desired goal.  If possible, sit up straight or lean slightly forward. Try not to slouch.  Hold the incentive spirometer in an upright position. INSTRUCTIONS FOR USE  1. Sit on the edge of your bed if possible, or sit up as far as you can in bed or on a chair. 2. Hold the incentive spirometer in an upright position. 3. Breathe out normally. 4. Place the mouthpiece in your mouth and seal your lips tightly around it. 5. Breathe in slowly and as deeply as possible, raising the piston or the ball toward the top of the column. 6. Hold your breath for 3-5 seconds or for as long as possible. Allow the piston or ball to fall to the bottom of the column. 7. Remove the mouthpiece from your mouth and breathe out normally. 8. Rest for a few seconds and repeat  Steps 1 through 7 at least 10 times every 1-2 hours when you are awake. Take your time and take a few normal breaths between deep breaths. 9. The spirometer may include an indicator to show your best effort. Use the indicator as a goal to work toward during each repetition. 10. After each set of 10 deep breaths, practice coughing to be sure your lungs are clear. If you have an incision (the cut made at the time of  surgery), support your incision when coughing by placing a pillow or rolled up towels firmly against it. Once you are able to get out of bed, walk around indoors and cough well. You may stop using the incentive spirometer when instructed by your caregiver.  RISKS AND COMPLICATIONS  Take your time so you do not get dizzy or light-headed.  If you are in pain, you may need to take or ask for pain medication before doing incentive spirometry. It is harder to take a deep breath if you are having pain. AFTER USE  Rest and breathe slowly and easily.  It can be helpful to keep track of a log of your progress. Your caregiver can provide you with a simple table to help with this. If you are using the spirometer at home, follow these instructions: [place] IF:   You are having difficultly using the spirometer.  You have trouble using the spirometer as often as instructed.  Your pain medication is not giving enough relief while using the spirometer.  You develop fever of 100.5 F (38.1 C) or higher. SEEK IMMEDIATE MEDICAL CARE IF:   You cough up bloody sputum that had not been present before.  You develop fever of 102 F (38.9 C) or greater.  You develop worsening pain at or near the incision site. MAKE SURE YOU:   Understand these instructions.  Will watch your condition.  Will get help right away if you are not doing well or get worse. Document Released: 02/26/2007 Document Revised: 01/08/2012 Document Reviewed: 04/29/2007 ExitCare Patient Information 2014  ExitCare, [place].   ________________________________________________________________________  WHAT IS A BLOOD TRANSFUSION? Blood Transfusion Information  A transfusion is the replacement of blood or some of its parts. Blood is made up of multiple cells which provide different functions.  Red blood cells carry oxygen and are used for blood loss replacement.  White blood cells fight against infection.  Platelets control bleeding.  Plasma helps clot blood.  Other blood products are available for specialized needs, such as hemophilia or other clotting disorders. BEFORE THE TRANSFUSION  Who gives blood for transfusions?   Healthy volunteers who are fully evaluated to make sure their blood is safe. This is blood bank blood. Transfusion therapy is the safest it has ever been in the practice of medicine. Before blood is taken from a donor, a complete history is taken to make sure that person has no history of diseases nor engages in risky social behavior (examples are intravenous drug use or sexual activity with multiple partners). The donor's travel history is screened to minimize risk of transmitting infections, such as malaria. The donated blood is tested for signs of infectious diseases, such as HIV and hepatitis. The blood is then tested to be sure it is compatible with you in order to minimize the chance of a transfusion reaction. If you or a relative donates blood, this is often done in anticipation of surgery and is not appropriate for emergency situations. It takes many days to process the donated blood. RISKS AND COMPLICATIONS Although transfusion therapy is very safe and saves many lives, the main dangers of transfusion include:   Getting an infectious disease.  Developing a transfusion reaction. This is an allergic reaction to something in the blood you were given. Every precaution is taken to prevent this. The decision to have a blood transfusion has been considered carefully by your  caregiver before blood is given. Blood is not given unless the benefits outweigh the risks. AFTER THE  TRANSFUSION  Right after receiving a blood transfusion, you will usually feel much better and more energetic. This is especially true if your red blood cells have gotten low (anemic). The transfusion raises the level of the red blood cells which carry oxygen, and this usually causes an energy increase.  The nurse administering the transfusion will monitor you carefully for complications. HOME CARE INSTRUCTIONS  No special instructions are needed after a transfusion. You may find your energy is better. Speak with your caregiver about any limitations on activity for underlying diseases you may have. SEEK MEDICAL CARE IF:   Your condition is not improving after your transfusion.  You develop redness or irritation at the intravenous (IV) site. SEEK IMMEDIATE MEDICAL CARE IF:  Any of the following symptoms occur over the next 12 hours:  Shaking chills.  You have a temperature by mouth above 102 F (38.9 C), not controlled by medicine.  Chest, back, or muscle pain.  People around you feel you are not acting correctly or are confused.  Shortness of breath or difficulty breathing.  Dizziness and fainting.  You get a rash or develop hives.  You have a decrease in urine output.  Your urine turns a dark color or changes to pink, red, or brown. Any of the following symptoms occur over the next 10 days:  You have a temperature by mouth above 102 F (38.9 C), not controlled by medicine.  Shortness of breath.  Weakness after normal activity.  The white part of the eye turns yellow (jaundice).  You have a decrease in the amount of urine or are urinating less often.  Your urine turns a dark color or changes to pink, red, or brown. Document Released: 10/13/2000 Document Revised: 01/08/2012 Document Reviewed: 06/01/2008 [facility] Patient Information 2014 [place],  [place].  _______________________________________________________________________

## 2014-06-07 ENCOUNTER — Ambulatory Visit: Payer: Self-pay | Admitting: Orthopedic Surgery

## 2014-06-07 NOTE — H&P (Signed)
Kenneth [name] DOB: [birth year] Divorced / Language: Cleophus Molt / Race: White Male Date of Admission:  06-08-2014 Chief Complaint:  Right knee pain History of Present Illness  The patient is a 68 year old male who comes in for a preoperative History and Physical. The patient is scheduled for a right total knee arthroplasty to be performed by Dr. Dione Plover. Aluisio, MD at [facility] on 06/08/2014. The patient is a 68 year old male who presents for follow up of their knee. The patient is being followed for their right knee pain and osteoarthritis. They are now several week(s) out from cortisone injection. Symptoms reported include: pain and aching. and report their pain level to be mild to moderate (The pain has decreased greatly with the injection, but still bothers him. He notes he has difficulty especially walking down inclines. He also gets cramping behind his knee if he sits too long). Current treatment includes: home exercise program (leg extensions with resistance bands. 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 The following medication has been used for pain control: antiinflammatory medication. He said he is very frustrated with his knees especially the right one. The injection helped for a very short amount of time. Did not provide him any improved function. He said the function is the main thing that is limiting him. The pain is also bad but he would like to be able to do more than he is currently doing. He feels as though the knees are having a very negative effect on his lifestyle. He is ready to get the knee fixed. They have been treated conservatively in the past for the above stated problem and despite conservative measures, they continue to have progressive pain and severe functional limitations and dysfunction. They have failed non-operative management including home exercise, medications, and  injections. It is felt that they would benefit from undergoing total joint replacement. Risks and benefits of the procedure have been discussed with the patient and they elect to proceed with surgery. There are no active contraindications to surgery such as ongoing infection or rapidly progressive neurological disease.   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 Diabetes Mellitus Mother.  Social History  Living situation live alone Exercise Exercises weekly; does running / walking, individual sport and gym / weights No history of drug/alcohol rehab Number of flights of stairs before winded less than 1 Not under pain contract Tobacco use Former smoker. 12/12/2013 Children 3 Current work status working part time Current drinker 12/12/2013: Currently drinks beer and wine only occasionally per week Tobacco / smoke exposure 12/12/2013: no Marital status divorced  Medication History AmLODIPine Besylate (10MG  Tablet, Oral) Active. Atenolol (25MG  Tablet, Oral) Active. Carvedilol (6.25MG  Tablet, Oral) Active. Furosemide (40MG  Tablet, Oral) Active. Lisinopril (40MG  Tablet, Oral) Active. Clopidogrel Bisulfate (75MG  Tablet, Oral) Active. Aspirin EC (325MG  Tablet DR, Oral) Active. Allopurinol (300MG  Tablet, Oral) Active. Colchicine (0.6MG  Tablet, Oral) Active. Nitrostat (0.4MG  Tab Sublingual, Sublingual) Active.  Past Surgical History  Carpal Tunnel Repair left Colon Polyp Removal - Colonoscopy Ankle Surgery right Heart Stents Date: 11/2012. Four Stents - 3 in the RCA and 1 in the LAD Hernia Repair Date: 2006. Right Inguinal  Past Medical History Hypercholesterolemia Myocardial infarction Date: 1999. Congestive Heart Failure Coronary artery disease High blood pressure Gout  Review of Systems  General Not  Present- Chills, Fatigue, Fever, Memory  Loss, Night Sweats, Weight Gain and Weight Loss. Skin Not Present- Eczema, Hives, Itching, Lesions and Rash. HEENT Not Present- Dentures, Double Vision, Headache, Hearing Loss, Tinnitus and Visual Loss. Respiratory Not Present- Allergies, Chronic Cough, Coughing up blood, Shortness of breath at rest and Shortness of breath with exertion. Cardiovascular Not Present- Chest Pain, Difficulty Breathing Lying Down, Murmur, Palpitations, Racing/skipping heartbeats and Swelling. Gastrointestinal Not Present- Abdominal Pain, Bloody Stool, Constipation, Diarrhea, Difficulty Swallowing, Heartburn, Jaundice, Loss of appetitie, Nausea and Vomiting. Male Genitourinary Not Present- Blood in Urine, Discharge, Flank Pain, Incontinence, Painful Urination, Urgency, Urinary frequency, Urinary Retention, Urinating at Night and Weak urinary stream. Musculoskeletal Not Present- Back Pain, Joint Pain, Joint Swelling, Morning Stiffness, Muscle Pain, Muscle Weakness and Spasms. Neurological Not Present- Blackout spells, Difficulty with balance, Dizziness, Paralysis, Tremor and Weakness. Psychiatric Not Present- Insomnia.   Vitals Height: 68in Height was reported by patient. Pulse: 68 (Regular)  BP: 118/76 (Sitting, Right Arm, Standard)    Physical Exam General Mental Status -Alert, cooperative and good historian. General Appearance-pleasant, Not in acute distress. Orientation-Oriented X3. Build & Nutrition-Well nourished and Well developed.  Head and Neck Head-normocephalic, atraumatic . Neck Global Assessment - supple, no bruit auscultated on the right, no bruit auscultated on the left.  Eye Pupil - Bilateral-Regular and Round. Motion - Bilateral-EOMI.  Chest and Lung Exam Auscultation Breath sounds - clear at anterior chest wall and clear at posterior chest wall. Adventitious sounds - No Adventitious sounds.  Cardiovascular Auscultation Rhythm - Regular rate and rhythm. Heart  Sounds - S1 WNL and S2 WNL. Murmurs & Other Heart Sounds - Auscultation of the heart reveals - No Murmurs.  Abdomen Palpation/Percussion Tenderness - Abdomen is non-tender to palpation. Rigidity (guarding) - Abdomen is soft. Auscultation Auscultation of the abdomen reveals - Bowel sounds normal.  Male Genitourinary Note: Not done, not pertinent to present illness   Musculoskeletal Note: Well developed male in no distress. Evaluation of his hips normal range of motion with no discomfort. His right knee shows a varus deformity, range about 5 to 125, moderate crepitus on range of motion. He is tender medial greater than lateral with no instability noted. Left knee no effusion. Range about 5 to 125. Slight crepitus on range of motion. No tenderness or instability noted.  RADIOGRAPHS: Radiographs are reviewed of both knees with severe bone on bone arthritis medial and patellofemoral right knee. Left knee near bone on bone medial and patellofemoral not quite as bad as the right.   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 Wynelle Link.  Plan is to go home with his son in [place], [place] initially after surgery.  PCP - Dr. Velna Hatchet Cardiology - Dr. Peter Martinique  The patient will receive topical TXA (tranexamic acid) due to: CAD, MI  Signed electronically by Ok Edwards, III PA-C

## 2014-06-08 ENCOUNTER — Encounter (HOSPITAL_COMMUNITY)
Admission: RE | Disposition: A | Discharge status: Home Health | Payer: Self-pay | Source: Ambulatory Visit | Attending: Orthopedic Surgery

## 2014-06-08 ENCOUNTER — Encounter (HOSPITAL_COMMUNITY): Payer: Medicare HMO | Admitting: Anesthesiology

## 2014-06-08 ENCOUNTER — Encounter (HOSPITAL_COMMUNITY): Payer: Self-pay | Admitting: *Deleted

## 2014-06-08 ENCOUNTER — Inpatient Hospital Stay (HOSPITAL_COMMUNITY): Payer: Medicare HMO | Admitting: Anesthesiology

## 2014-06-08 ENCOUNTER — Inpatient Hospital Stay (HOSPITAL_COMMUNITY)
Admission: RE | Admit: 2014-06-08 | Discharge: 2014-06-10 | DRG: 470 | Disposition: A | Discharge status: Home Health | Payer: Medicare HMO | Source: Ambulatory Visit | Attending: Orthopedic Surgery | Admitting: Orthopedic Surgery

## 2014-06-08 DIAGNOSIS — I2510 Atherosclerotic heart disease of native coronary artery without angina pectoris: Secondary | ICD-10-CM | POA: Diagnosis present

## 2014-06-08 DIAGNOSIS — Z833 Family history of diabetes mellitus: Secondary | ICD-10-CM

## 2014-06-08 DIAGNOSIS — Z7982 Long term (current) use of aspirin: Secondary | ICD-10-CM | POA: Diagnosis not present

## 2014-06-08 DIAGNOSIS — Z8249 Family history of ischemic heart disease and other diseases of the circulatory system: Secondary | ICD-10-CM

## 2014-06-08 DIAGNOSIS — E7800 Pure hypercholesterolemia, unspecified: Secondary | ICD-10-CM | POA: Diagnosis present

## 2014-06-08 DIAGNOSIS — I509 Heart failure, unspecified: Secondary | ICD-10-CM | POA: Diagnosis present

## 2014-06-08 DIAGNOSIS — E785 Hyperlipidemia, unspecified: Secondary | ICD-10-CM | POA: Diagnosis present

## 2014-06-08 DIAGNOSIS — M179 Osteoarthritis of knee, unspecified: Secondary | ICD-10-CM | POA: Diagnosis present

## 2014-06-08 DIAGNOSIS — M1710 Unilateral primary osteoarthritis, unspecified knee: Secondary | ICD-10-CM | POA: Diagnosis present

## 2014-06-08 DIAGNOSIS — E119 Type 2 diabetes mellitus without complications: Secondary | ICD-10-CM | POA: Diagnosis present

## 2014-06-08 DIAGNOSIS — Z87891 Personal history of nicotine dependence: Secondary | ICD-10-CM | POA: Diagnosis not present

## 2014-06-08 DIAGNOSIS — I252 Old myocardial infarction: Secondary | ICD-10-CM | POA: Diagnosis not present

## 2014-06-08 DIAGNOSIS — Z6834 Body mass index (BMI) 34.0-34.9, adult: Secondary | ICD-10-CM | POA: Diagnosis not present

## 2014-06-08 DIAGNOSIS — Z01812 Encounter for preprocedural laboratory examination: Secondary | ICD-10-CM

## 2014-06-08 DIAGNOSIS — I10 Essential (primary) hypertension: Secondary | ICD-10-CM | POA: Diagnosis present

## 2014-06-08 DIAGNOSIS — M25569 Pain in unspecified knee: Secondary | ICD-10-CM | POA: Diagnosis present

## 2014-06-08 DIAGNOSIS — Z860100 Personal history of colon polyps, unspecified: Secondary | ICD-10-CM

## 2014-06-08 DIAGNOSIS — Z9861 Coronary angioplasty status: Secondary | ICD-10-CM

## 2014-06-08 DIAGNOSIS — D62 Acute posthemorrhagic anemia: Secondary | ICD-10-CM | POA: Diagnosis not present

## 2014-06-08 DIAGNOSIS — Z96651 Presence of right artificial knee joint: Secondary | ICD-10-CM

## 2014-06-08 DIAGNOSIS — M1711 Unilateral primary osteoarthritis, right knee: Secondary | ICD-10-CM

## 2014-06-08 HISTORY — PX: TOTAL KNEE ARTHROPLASTY: SHX125

## 2014-06-08 LAB — GLUCOSE, CAPILLARY
Glucose-Capillary: 160 mg/dL — ABNORMAL HIGH (ref 70–99)
Glucose-Capillary: 87 mg/dL (ref 70–99)

## 2014-06-08 LAB — TYPE AND SCREEN
ABO/RH(D): A POS
Antibody Screen: NEGATIVE

## 2014-06-08 LAB — ABO/RH: ABO/RH(D): A POS

## 2014-06-08 SURGERY — ARTHROPLASTY, KNEE, TOTAL
Anesthesia: Spinal | Site: Knee | Laterality: Right

## 2014-06-08 MED ORDER — BUPIVACAINE HCL (PF) 0.75 % IJ SOLN
INTRAMUSCULAR | Status: DC | PRN
  Administered 2014-06-08: 2 mL

## 2014-06-08 MED ORDER — MIDAZOLAM HCL 2 MG/2ML IJ SOLN
INTRAMUSCULAR | Status: AC
  Filled 2014-06-08: qty 2

## 2014-06-08 MED ORDER — PROPOFOL 10 MG/ML IV BOLUS
INTRAVENOUS | Status: AC
Start: 2014-06-08 — End: 2014-06-08
  Filled 2014-06-08: qty 20

## 2014-06-08 MED ORDER — HYDROMORPHONE HCL PF 1 MG/ML IJ SOLN: 0.2500 mg | INTRAMUSCULAR | Status: DC | PRN

## 2014-06-08 MED ORDER — ATROPINE SULFATE 0.4 MG/ML IJ SOLN
INTRAMUSCULAR | Status: AC
  Filled 2014-06-08: qty 1

## 2014-06-08 MED ORDER — FUROSEMIDE 40 MG PO TABS
40.0000 mg | ORAL_TABLET | Freq: Every day | ORAL | Status: DC
Start: 2014-06-08 — End: 2014-06-10
  Administered 2014-06-08 – 2014-06-10 (×3): 40 mg via ORAL
  Filled 2014-06-08 (×3): qty 1

## 2014-06-08 MED ORDER — MEPERIDINE HCL 50 MG/ML IJ SOLN: 6.2500 mg | INTRAMUSCULAR | Status: DC | PRN

## 2014-06-08 MED ORDER — EPHEDRINE SULFATE 50 MG/ML IJ SOLN
INTRAMUSCULAR | Status: AC
  Filled 2014-06-08: qty 1

## 2014-06-08 MED ORDER — PROPOFOL INFUSION 10 MG/ML OPTIME
INTRAVENOUS | Status: DC | PRN
  Administered 2014-06-08: 50 ug/kg/min via INTRAVENOUS

## 2014-06-08 MED ORDER — CHLORHEXIDINE GLUCONATE 4 % EX LIQD: 60.0000 mL | Freq: Once | CUTANEOUS | Status: DC

## 2014-06-08 MED ORDER — DEXAMETHASONE SODIUM PHOSPHATE 10 MG/ML IJ SOLN
10.0000 mg | Freq: Once | INTRAMUSCULAR | Status: AC
  Administered 2014-06-08: 10 mg via INTRAVENOUS

## 2014-06-08 MED ORDER — CEFAZOLIN SODIUM-DEXTROSE 2-3 GM-% IV SOLR
2.0000 g | Freq: Four times a day (QID) | INTRAVENOUS | Status: AC
  Administered 2014-06-08 (×2): 2 g via INTRAVENOUS
  Filled 2014-06-08 (×2): qty 50

## 2014-06-08 MED ORDER — FENTANYL CITRATE 0.05 MG/ML IJ SOLN
INTRAMUSCULAR | Status: DC | PRN
  Administered 2014-06-08: 50 ug via INTRAVENOUS

## 2014-06-08 MED ORDER — KETOROLAC TROMETHAMINE 15 MG/ML IJ SOLN
7.5000 mg | Freq: Four times a day (QID) | INTRAMUSCULAR | Status: AC | PRN
  Administered 2014-06-08: 7.5 mg via INTRAVENOUS
  Filled 2014-06-08: qty 1

## 2014-06-08 MED ORDER — PHENYLEPHRINE 40 MCG/ML (10ML) SYRINGE FOR IV PUSH (FOR BLOOD PRESSURE SUPPORT)
PREFILLED_SYRINGE | INTRAVENOUS | Status: AC
  Filled 2014-06-08: qty 20

## 2014-06-08 MED ORDER — ONDANSETRON HCL 4 MG/2ML IJ SOLN: 4.0000 mg | Freq: Four times a day (QID) | INTRAMUSCULAR | Status: DC | PRN

## 2014-06-08 MED ORDER — SODIUM CHLORIDE 0.9 % IR SOLN
Status: DC | PRN
  Administered 2014-06-08: 1000 mL

## 2014-06-08 MED ORDER — SODIUM CHLORIDE 0.9 % IJ SOLN
INTRAMUSCULAR | Status: DC | PRN
  Administered 2014-06-08: 30 mL

## 2014-06-08 MED ORDER — PROPOFOL 10 MG/ML IV BOLUS
INTRAVENOUS | Status: DC | PRN
  Administered 2014-06-08 (×8): 20 mg via INTRAVENOUS

## 2014-06-08 MED ORDER — COLCHICINE 0.6 MG PO TABS
0.6000 mg | ORAL_TABLET | Freq: Every day | ORAL | Status: DC | PRN
  Filled 2014-06-08: qty 1

## 2014-06-08 MED ORDER — FLEET ENEMA 7-19 GM/118ML RE ENEM: 1.0000 | ENEMA | Freq: Once | RECTAL | Status: AC | PRN

## 2014-06-08 MED ORDER — ONDANSETRON HCL 4 MG/2ML IJ SOLN
INTRAMUSCULAR | Status: AC
  Filled 2014-06-08: qty 2

## 2014-06-08 MED ORDER — CARVEDILOL 6.25 MG PO TABS
6.2500 mg | ORAL_TABLET | Freq: Two times a day (BID) | ORAL | Status: DC
  Administered 2014-06-08 – 2014-06-10 (×4): 6.25 mg via ORAL
  Filled 2014-06-08 (×6): qty 1

## 2014-06-08 MED ORDER — FENTANYL CITRATE 0.05 MG/ML IJ SOLN
INTRAMUSCULAR | Status: AC
  Filled 2014-06-08: qty 5

## 2014-06-08 MED ORDER — TRANEXAMIC ACID 100 MG/ML IV SOLN
2000.0000 mg | Freq: Once | INTRAVENOUS | Status: DC
  Filled 2014-06-08: qty 20

## 2014-06-08 MED ORDER — BUPIVACAINE HCL (PF) 0.25 % IJ SOLN
INTRAMUSCULAR | Status: AC
  Filled 2014-06-08: qty 30

## 2014-06-08 MED ORDER — ASPIRIN 81 MG PO CHEW
81.0000 mg | CHEWABLE_TABLET | Freq: Every day | ORAL | Status: DC
  Administered 2014-06-09 – 2014-06-10 (×2): 81 mg via ORAL
  Filled 2014-06-08 (×2): qty 1

## 2014-06-08 MED ORDER — SODIUM CHLORIDE 0.9 % IV SOLN: INTRAVENOUS | Status: DC

## 2014-06-08 MED ORDER — METOCLOPRAMIDE HCL 10 MG PO TABS: 5.0000 mg | ORAL_TABLET | Freq: Three times a day (TID) | ORAL | Status: DC | PRN

## 2014-06-08 MED ORDER — ACETAMINOPHEN 10 MG/ML IV SOLN
1000.0000 mg | Freq: Once | INTRAVENOUS | Status: AC
  Administered 2014-06-08: 1000 mg via INTRAVENOUS
  Filled 2014-06-08: qty 100

## 2014-06-08 MED ORDER — DEXAMETHASONE 6 MG PO TABS
10.0000 mg | ORAL_TABLET | Freq: Every day | ORAL | Status: AC
  Administered 2014-06-09: 10 mg via ORAL
  Filled 2014-06-08: qty 1

## 2014-06-08 MED ORDER — EZETIMIBE 10 MG PO TABS
10.0000 mg | ORAL_TABLET | Freq: Every day | ORAL | Status: DC
Start: 2014-06-08 — End: 2014-06-10
  Administered 2014-06-08 – 2014-06-09 (×2): 10 mg via ORAL
  Filled 2014-06-08 (×3): qty 1

## 2014-06-08 MED ORDER — MIDAZOLAM HCL 5 MG/5ML IJ SOLN
INTRAMUSCULAR | Status: DC | PRN
  Administered 2014-06-08: 2 mg via INTRAVENOUS

## 2014-06-08 MED ORDER — ONDANSETRON HCL 4 MG PO TABS: 4.0000 mg | ORAL_TABLET | Freq: Four times a day (QID) | ORAL | Status: DC | PRN

## 2014-06-08 MED ORDER — CEFAZOLIN SODIUM-DEXTROSE 2-3 GM-% IV SOLR
2.0000 g | INTRAVENOUS | Status: AC
  Administered 2014-06-08: 2 g via INTRAVENOUS

## 2014-06-08 MED ORDER — DOCUSATE SODIUM 100 MG PO CAPS
100.0000 mg | ORAL_CAPSULE | Freq: Two times a day (BID) | ORAL | Status: DC
  Administered 2014-06-08 – 2014-06-10 (×5): 100 mg via ORAL

## 2014-06-08 MED ORDER — METOCLOPRAMIDE HCL 5 MG/ML IJ SOLN: 5.0000 mg | Freq: Three times a day (TID) | INTRAMUSCULAR | Status: DC | PRN

## 2014-06-08 MED ORDER — PHENOL 1.4 % MT LIQD
1.0000 | OROMUCOSAL | Status: DC | PRN
  Filled 2014-06-08: qty 177

## 2014-06-08 MED ORDER — POLYETHYLENE GLYCOL 3350 17 G PO PACK
17.0000 g | PACK | Freq: Every day | ORAL | Status: DC | PRN
  Administered 2014-06-09: 17 g via ORAL

## 2014-06-08 MED ORDER — SODIUM CHLORIDE 0.9 % IJ SOLN
INTRAMUSCULAR | Status: AC
  Filled 2014-06-08: qty 50

## 2014-06-08 MED ORDER — ALLOPURINOL 300 MG PO TABS
300.0000 mg | ORAL_TABLET | Freq: Every day | ORAL | Status: DC
  Administered 2014-06-09 – 2014-06-10 (×2): 300 mg via ORAL
  Filled 2014-06-08 (×2): qty 1

## 2014-06-08 MED ORDER — MENTHOL 3 MG MT LOZG
1.0000 | LOZENGE | OROMUCOSAL | Status: DC | PRN
  Filled 2014-06-08: qty 9

## 2014-06-08 MED ORDER — LIDOCAINE HCL (CARDIAC) 20 MG/ML IV SOLN
INTRAVENOUS | Status: AC
  Filled 2014-06-08: qty 5

## 2014-06-08 MED ORDER — OXYCODONE HCL 5 MG/5ML PO SOLN
5.0000 mg | Freq: Once | ORAL | Status: DC | PRN
  Filled 2014-06-08: qty 5

## 2014-06-08 MED ORDER — DEXAMETHASONE SODIUM PHOSPHATE 10 MG/ML IJ SOLN
10.0000 mg | Freq: Every day | INTRAMUSCULAR | Status: AC
  Filled 2014-06-08: qty 1

## 2014-06-08 MED ORDER — PROPOFOL 10 MG/ML IV BOLUS
INTRAVENOUS | Status: AC
  Filled 2014-06-08: qty 20

## 2014-06-08 MED ORDER — LORATADINE 10 MG PO TABS
10.0000 mg | ORAL_TABLET | Freq: Every day | ORAL | Status: DC
  Administered 2014-06-09 – 2014-06-10 (×2): 10 mg via ORAL
  Filled 2014-06-08 (×3): qty 1

## 2014-06-08 MED ORDER — FLUTICASONE PROPIONATE 50 MCG/ACT NA SUSP
2.0000 | Freq: Every day | NASAL | Status: DC | PRN
  Filled 2014-06-08: qty 16

## 2014-06-08 MED ORDER — METHOCARBAMOL 1000 MG/10ML IJ SOLN
500.0000 mg | Freq: Four times a day (QID) | INTRAVENOUS | Status: DC | PRN
  Administered 2014-06-08: 500 mg via INTRAVENOUS
  Filled 2014-06-08: qty 5

## 2014-06-08 MED ORDER — BUPIVACAINE HCL 0.25 % IJ SOLN
INTRAMUSCULAR | Status: DC | PRN
  Administered 2014-06-08: 30 mL

## 2014-06-08 MED ORDER — BISACODYL 10 MG RE SUPP: 10.0000 mg | Freq: Every day | RECTAL | Status: DC | PRN

## 2014-06-08 MED ORDER — DIPHENHYDRAMINE HCL 12.5 MG/5ML PO ELIX: 12.5000 mg | ORAL_SOLUTION | ORAL | Status: DC | PRN

## 2014-06-08 MED ORDER — TRAMADOL HCL 50 MG PO TABS: 50.0000 mg | ORAL_TABLET | Freq: Four times a day (QID) | ORAL | Status: DC | PRN

## 2014-06-08 MED ORDER — MORPHINE SULFATE 2 MG/ML IJ SOLN: 1.0000 mg | INTRAMUSCULAR | Status: DC | PRN

## 2014-06-08 MED ORDER — OXYCODONE HCL 5 MG PO TABS
5.0000 mg | ORAL_TABLET | ORAL | Status: DC | PRN
  Administered 2014-06-08 (×2): 5 mg via ORAL
  Administered 2014-06-08 – 2014-06-09 (×4): 10 mg via ORAL
  Administered 2014-06-09: 5 mg via ORAL
  Administered 2014-06-09: 10 mg via ORAL
  Administered 2014-06-09 (×2): 5 mg via ORAL
  Administered 2014-06-09 – 2014-06-10 (×2): 10 mg via ORAL
  Administered 2014-06-10: 5 mg via ORAL
  Administered 2014-06-10: 10 mg via ORAL
  Filled 2014-06-08: qty 2
  Filled 2014-06-08: qty 1
  Filled 2014-06-08 (×4): qty 2
  Filled 2014-06-08 (×4): qty 1
  Filled 2014-06-08: qty 2
  Filled 2014-06-08: qty 1
  Filled 2014-06-08: qty 2
  Filled 2014-06-08 (×3): qty 1

## 2014-06-08 MED ORDER — METHOCARBAMOL 500 MG PO TABS
500.0000 mg | ORAL_TABLET | Freq: Four times a day (QID) | ORAL | Status: DC | PRN
  Administered 2014-06-08 – 2014-06-10 (×6): 500 mg via ORAL
  Filled 2014-06-08 (×6): qty 1

## 2014-06-08 MED ORDER — LACTATED RINGERS IV SOLN
INTRAVENOUS | Status: DC | PRN
  Administered 2014-06-08 (×3): via INTRAVENOUS

## 2014-06-08 MED ORDER — ACETAMINOPHEN 500 MG PO TABS
1000.0000 mg | ORAL_TABLET | Freq: Four times a day (QID) | ORAL | Status: AC
  Administered 2014-06-08 – 2014-06-09 (×4): 1000 mg via ORAL
  Filled 2014-06-08 (×5): qty 2

## 2014-06-08 MED ORDER — CEFAZOLIN SODIUM-DEXTROSE 2-3 GM-% IV SOLR
INTRAVENOUS | Status: AC
  Filled 2014-06-08: qty 50

## 2014-06-08 MED ORDER — ACETAMINOPHEN 650 MG RE SUPP: 650.0000 mg | Freq: Four times a day (QID) | RECTAL | Status: DC | PRN

## 2014-06-08 MED ORDER — TRANEXAMIC ACID 100 MG/ML IV SOLN
2000.0000 mg | INTRAVENOUS | Status: DC | PRN
  Administered 2014-06-08: 2000 mg via INTRAVENOUS

## 2014-06-08 MED ORDER — RIVAROXABAN 10 MG PO TABS
10.0000 mg | ORAL_TABLET | Freq: Every day | ORAL | Status: DC
  Filled 2014-06-08 (×2): qty 1

## 2014-06-08 MED ORDER — SODIUM CHLORIDE 0.9 % IV SOLN
INTRAVENOUS | Status: DC
  Administered 2014-06-08: via INTRAVENOUS
  Administered 2014-06-08: 100 mL/h via INTRAVENOUS

## 2014-06-08 MED ORDER — ACETAMINOPHEN 325 MG PO TABS
650.0000 mg | ORAL_TABLET | Freq: Four times a day (QID) | ORAL | Status: DC | PRN
  Administered 2014-06-09: 650 mg via ORAL
  Filled 2014-06-08: qty 2

## 2014-06-08 MED ORDER — AMLODIPINE BESYLATE 5 MG PO TABS
5.0000 mg | ORAL_TABLET | Freq: Every morning | ORAL | Status: DC
  Administered 2014-06-09 – 2014-06-10 (×2): 5 mg via ORAL
  Filled 2014-06-08 (×2): qty 1

## 2014-06-08 MED ORDER — OXYCODONE HCL 5 MG PO TABS: 5.0000 mg | ORAL_TABLET | Freq: Once | ORAL | Status: DC | PRN

## 2014-06-08 MED ORDER — NITROGLYCERIN 0.4 MG SL SUBL: 0.4000 mg | SUBLINGUAL_TABLET | SUBLINGUAL | Status: DC | PRN

## 2014-06-08 MED ORDER — BUPIVACAINE LIPOSOME 1.3 % IJ SUSP
20.0000 mL | Freq: Once | INTRAMUSCULAR | Status: DC
  Filled 2014-06-08: qty 20

## 2014-06-08 MED ORDER — PROMETHAZINE HCL 25 MG/ML IJ SOLN: 6.2500 mg | INTRAMUSCULAR | Status: DC | PRN

## 2014-06-08 MED ORDER — LACTATED RINGERS IV SOLN: INTRAVENOUS | Status: DC

## 2014-06-08 MED ORDER — ONDANSETRON HCL 4 MG/2ML IJ SOLN
INTRAMUSCULAR | Status: DC | PRN
  Administered 2014-06-08: 4 mg via INTRAVENOUS

## 2014-06-08 MED ORDER — BUPIVACAINE LIPOSOME 1.3 % IJ SUSP
INTRAMUSCULAR | Status: DC | PRN
  Administered 2014-06-08: 20 mL

## 2014-06-08 MED ORDER — PHENYLEPHRINE HCL 10 MG/ML IJ SOLN
INTRAMUSCULAR | Status: DC | PRN
  Administered 2014-06-08 (×14): 80 ug via INTRAVENOUS

## 2014-06-08 MED ORDER — SODIUM CHLORIDE 0.9 % IJ SOLN
INTRAMUSCULAR | Status: AC
  Filled 2014-06-08: qty 10

## 2014-06-08 SURGICAL SUPPLY — 61 items
BAG ZIPLOCK 12X15 (MISCELLANEOUS) ×3 IMPLANT
BANDAGE ELASTIC 6 VELCRO ST LF (GAUZE/BANDAGES/DRESSINGS) ×3 IMPLANT
BANDAGE ESMARK 6X9 LF (GAUZE/BANDAGES/DRESSINGS) ×1 IMPLANT
BLADE SAG 18X100X1.27 (BLADE) ×3 IMPLANT
BLADE SAW SGTL 11.0X1.19X90.0M (BLADE) ×3 IMPLANT
BNDG ESMARK 6X9 LF (GAUZE/BANDAGES/DRESSINGS) ×3
BOWL SMART MIX CTS (DISPOSABLE) ×3 IMPLANT
CAP KNEE ATTUNE RP ×3 IMPLANT
CEMENT HV SMART SET (Cement) ×6 IMPLANT
CLOSURE WOUND 1/2 X4 (GAUZE/BANDAGES/DRESSINGS) ×1
CUFF TOURN SGL QUICK 34 (TOURNIQUET CUFF) ×2
CUFF TRNQT CYL 34X4X40X1 (TOURNIQUET CUFF) ×1 IMPLANT
DECANTER SPIKE VIAL GLASS SM (MISCELLANEOUS) ×3 IMPLANT
DRAPE EXTREMITY T 121X128X90 (DRAPE) ×3 IMPLANT
DRAPE POUCH INSTRU U-SHP 10X18 (DRAPES) ×3 IMPLANT
DRAPE U-SHAPE 47X51 STRL (DRAPES) ×3 IMPLANT
DRSG ADAPTIC 3X8 NADH LF (GAUZE/BANDAGES/DRESSINGS) ×3 IMPLANT
DRSG PAD ABDOMINAL 8X10 ST (GAUZE/BANDAGES/DRESSINGS) IMPLANT
DURAPREP 26ML APPLICATOR (WOUND CARE) ×3 IMPLANT
ELECT REM PT RETURN 9FT ADLT (ELECTROSURGICAL) ×3
ELECTRODE REM PT RTRN 9FT ADLT (ELECTROSURGICAL) ×1 IMPLANT
EVACUATOR 1/8 PVC DRAIN (DRAIN) ×3 IMPLANT
FACESHIELD WRAPAROUND (MASK) ×15 IMPLANT
GAUZE SPONGE 4X4 12PLY STRL (GAUZE/BANDAGES/DRESSINGS) ×3 IMPLANT
GLOVE BIO SURGEON STRL SZ7.5 (GLOVE) IMPLANT
GLOVE BIO SURGEON STRL SZ8 (GLOVE) ×3 IMPLANT
GLOVE BIOGEL PI IND STRL 6.5 (GLOVE) IMPLANT
GLOVE BIOGEL PI IND STRL 8 (GLOVE) ×1 IMPLANT
GLOVE BIOGEL PI INDICATOR 6.5 (GLOVE)
GLOVE BIOGEL PI INDICATOR 8 (GLOVE) ×2
GLOVE SURG SS PI 6.5 STRL IVOR (GLOVE) IMPLANT
GOWN STRL REUS W/TWL LRG LVL3 (GOWN DISPOSABLE) ×3 IMPLANT
GOWN STRL REUS W/TWL XL LVL3 (GOWN DISPOSABLE) IMPLANT
HANDPIECE INTERPULSE COAX TIP (DISPOSABLE) ×2
IMMOBILIZER KNEE 20 (SOFTGOODS) ×3 IMPLANT
IMMOBILIZER KNEE 20 THIGH 36 (SOFTGOODS) IMPLANT
KIT BASIN OR (CUSTOM PROCEDURE TRAY) ×3 IMPLANT
MANIFOLD NEPTUNE II (INSTRUMENTS) ×3 IMPLANT
NDL SAFETY ECLIPSE 18X1.5 (NEEDLE) ×2 IMPLANT
NEEDLE HYPO 18GX1.5 SHARP (NEEDLE) ×4
NS IRRIG 1000ML POUR BTL (IV SOLUTION) ×3 IMPLANT
PACK TOTAL JOINT (CUSTOM PROCEDURE TRAY) ×3 IMPLANT
PAD ABD 8X10 STRL (GAUZE/BANDAGES/DRESSINGS) ×3 IMPLANT
PADDING CAST COTTON 6X4 STRL (CAST SUPPLIES) ×3 IMPLANT
POSITIONER SURGICAL ARM (MISCELLANEOUS) ×3 IMPLANT
SET HNDPC FAN SPRY TIP SCT (DISPOSABLE) ×1 IMPLANT
STRIP CLOSURE SKIN 1/2X4 (GAUZE/BANDAGES/DRESSINGS) ×2 IMPLANT
SUCTION FRAZIER 12FR DISP (SUCTIONS) ×3 IMPLANT
SUT MNCRL AB 4-0 PS2 18 (SUTURE) ×3 IMPLANT
SUT VIC AB 2-0 CT1 27 (SUTURE) ×6
SUT VIC AB 2-0 CT1 TAPERPNT 27 (SUTURE) ×3 IMPLANT
SUT VIC AB 2-0 CT2 27 (SUTURE) ×3 IMPLANT
SUT VLOC 180 0 24IN GS25 (SUTURE) ×3 IMPLANT
SYRINGE 20CC LL (MISCELLANEOUS) ×3 IMPLANT
SYRINGE 60CC LL (MISCELLANEOUS) ×3 IMPLANT
TOWEL OR 17X26 10 PK STRL BLUE (TOWEL DISPOSABLE) ×3 IMPLANT
TOWEL OR NON WOVEN STRL DISP B (DISPOSABLE) IMPLANT
TRAY FOLEY CATH 14FRSI W/METER (CATHETERS) IMPLANT
TRAY FOLEY CATH 16FRSI W/METER (SET/KITS/TRAYS/PACK) ×3 IMPLANT
WATER STERILE IRR 1500ML POUR (IV SOLUTION) ×3 IMPLANT
WRAP KNEE MAXI GEL POST OP (GAUZE/BANDAGES/DRESSINGS) ×3 IMPLANT

## 2014-06-08 NOTE — Progress Notes (Signed)
Orthopedic Tech Progress Note Patient Details:  Kenneth [name] [birth year] [id number] Applied CPM to RLE. CPM Right Knee CPM Right Knee: On Right Knee Flexion (Degrees): 40 Right Knee Extension (Degrees): 10   Darrol Poke 06/08/2014, 9:59 AM

## 2014-06-08 NOTE — H&P (View-Only) (Signed)
Kenneth [name] DOB: 1946/06/23 Divorced / Language: Cleophus Molt / Race: White Male Date of Admission:  06-08-2014 Chief Complaint:  Right knee pain History of Present Illness  The patient is a 68 year old male who comes in for a preoperative History and Physical. The patient is scheduled for a right total knee arthroplasty to be performed by Dr. Dione Plover. Aluisio, MD at [facility] on 06/08/2014. The patient is a 68 year old male who presents for follow up of their knee. The patient is being followed for their right knee pain and osteoarthritis. They are now several week(s) out from cortisone injection. Symptoms reported include: pain and aching. and report their pain level to be mild to moderate (The pain has decreased greatly with the injection, but still bothers him. He notes he has difficulty especially walking down inclines. He also gets cramping behind his knee if he sits too long). Current treatment includes: home exercise program (leg extensions with resistance bands. 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 The following medication has been used for pain control: antiinflammatory medication. He said he is very frustrated with his knees especially the right one. The injection helped for a very short amount of time. Did not provide him any improved function. He said the function is the main thing that is limiting him. The pain is also bad but he would like to be able to do more than he is currently doing. He feels as though the knees are having a very negative effect on his lifestyle. He is ready to get the knee fixed. They have been treated conservatively in the past for the above stated problem and despite conservative measures, they continue to have progressive pain and severe functional limitations and dysfunction. They have failed non-operative management including home exercise, medications, and  injections. It is felt that they would benefit from undergoing total joint replacement. Risks and benefits of the procedure have been discussed with the patient and they elect to proceed with surgery. There are no active contraindications to surgery such as ongoing infection or rapidly progressive neurological disease.   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 Diabetes Mellitus Mother.  Social History  Living situation live alone Exercise Exercises weekly; does running / walking, individual sport and gym / weights No history of drug/alcohol rehab Number of flights of stairs before winded less than 1 Not under pain contract Tobacco use Former smoker. 12/12/2013 Children 3 Current work status working part time Current drinker 12/12/2013: Currently drinks beer and wine only occasionally per week Tobacco / smoke exposure 12/12/2013: no Marital status divorced  Medication History AmLODIPine Besylate (10MG  Tablet, Oral) Active. Atenolol (25MG  Tablet, Oral) Active. Carvedilol (6.25MG  Tablet, Oral) Active. Furosemide (40MG  Tablet, Oral) Active. Lisinopril (40MG  Tablet, Oral) Active. Clopidogrel Bisulfate (75MG  Tablet, Oral) Active. Aspirin EC (325MG  Tablet DR, Oral) Active. Allopurinol (300MG  Tablet, Oral) Active. Colchicine (0.6MG  Tablet, Oral) Active. Nitrostat (0.4MG  Tab Sublingual, Sublingual) Active.  Past Surgical History  Carpal Tunnel Repair left Colon Polyp Removal - Colonoscopy Ankle Surgery right Heart Stents Date: 11/2012. Four Stents - 3 in the RCA and 1 in the LAD Hernia Repair Date: 2006. Right Inguinal  Past Medical History Hypercholesterolemia Myocardial infarction Date: 1999. Congestive Heart Failure Coronary artery disease High blood pressure Gout  Review of Systems  General Not  Present- Chills, Fatigue, Fever, Memory  Loss, Night Sweats, Weight Gain and Weight Loss. Skin Not Present- Eczema, Hives, Itching, Lesions and Rash. HEENT Not Present- Dentures, Double Vision, Headache, Hearing Loss, Tinnitus and Visual Loss. Respiratory Not Present- Allergies, Chronic Cough, Coughing up blood, Shortness of breath at rest and Shortness of breath with exertion. Cardiovascular Not Present- Chest Pain, Difficulty Breathing Lying Down, Murmur, Palpitations, Racing/skipping heartbeats and Swelling. Gastrointestinal Not Present- Abdominal Pain, Bloody Stool, Constipation, Diarrhea, Difficulty Swallowing, Heartburn, Jaundice, Loss of appetitie, Nausea and Vomiting. Male Genitourinary Not Present- Blood in Urine, Discharge, Flank Pain, Incontinence, Painful Urination, Urgency, Urinary frequency, Urinary Retention, Urinating at Night and Weak urinary stream. Musculoskeletal Not Present- Back Pain, Joint Pain, Joint Swelling, Morning Stiffness, Muscle Pain, Muscle Weakness and Spasms. Neurological Not Present- Blackout spells, Difficulty with balance, Dizziness, Paralysis, Tremor and Weakness. Psychiatric Not Present- Insomnia.   Vitals Height: 68in Height was reported by patient. Pulse: 68 (Regular)  BP: 118/76 (Sitting, Right Arm, Standard)    Physical Exam General Mental Status -Alert, cooperative and good historian. General Appearance-pleasant, Not in acute distress. Orientation-Oriented X3. Build & Nutrition-Well nourished and Well developed.  Head and Neck Head-normocephalic, atraumatic . Neck Global Assessment - supple, no bruit auscultated on the right, no bruit auscultated on the left.  Eye Pupil - Bilateral-Regular and Round. Motion - Bilateral-EOMI.  Chest and Lung Exam Auscultation Breath sounds - clear at anterior chest wall and clear at posterior chest wall. Adventitious sounds - No Adventitious sounds.  Cardiovascular Auscultation Rhythm - Regular rate and rhythm. Heart  Sounds - S1 WNL and S2 WNL. Murmurs & Other Heart Sounds - Auscultation of the heart reveals - No Murmurs.  Abdomen Palpation/Percussion Tenderness - Abdomen is non-tender to palpation. Rigidity (guarding) - Abdomen is soft. Auscultation Auscultation of the abdomen reveals - Bowel sounds normal.  Male Genitourinary Note: Not done, not pertinent to present illness   Musculoskeletal Note: Well developed male in no distress. Evaluation of his hips normal range of motion with no discomfort. His right knee shows a varus deformity, range about 5 to 125, moderate crepitus on range of motion. He is tender medial greater than lateral with no instability noted. Left knee no effusion. Range about 5 to 125. Slight crepitus on range of motion. No tenderness or instability noted.  RADIOGRAPHS: Radiographs are reviewed of both knees with severe bone on bone arthritis medial and patellofemoral right knee. Left knee near bone on bone medial and patellofemoral not quite as bad as the right.   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 Wynelle Link.  Plan is to go home with his son in [place], [place] initially after surgery.  PCP - Dr. Velna Hatchet Cardiology - Dr. Peter Martinique  The patient will receive topical TXA (tranexamic acid) due to: CAD, MI  Signed electronically by Ok Edwards, III PA-C

## 2014-06-08 NOTE — Anesthesia Preprocedure Evaluation (Addendum)
Anesthesia Evaluation  Patient identified by MRN, date of birth, ID band Patient awake    Reviewed: Allergy & Precautions, H&P , NPO status , Patient's Chart, lab work & pertinent test results, reviewed documented beta blocker date and time   Airway Mallampati: II TM Distance: >3 FB Neck ROM: Full    Dental no notable dental hx.    Pulmonary former smoker,  breath sounds clear to auscultation  Pulmonary exam normal       Cardiovascular hypertension, Pt. on medications and Pt. on home beta blockers + angina + CAD, + Past MI and +CHF Rhythm:Regular Rate:Normal     Neuro/Psych negative neurological ROS  negative psych ROS   GI/Hepatic negative GI ROS, Neg liver ROS,   Endo/Other  diabetes, Type 2, Oral Hypoglycemic Agents  Renal/GU negative Renal ROS     Musculoskeletal negative musculoskeletal ROS (+)   Abdominal   Peds  Hematology negative hematology ROS (+)   Anesthesia Other Findings   Reproductive/Obstetrics negative OB ROS                         Anesthesia Physical Anesthesia Plan  ASA: III  Anesthesia Plan:    Post-op Pain Management:    Induction:   Airway Management Planned:   Additional Equipment:   Intra-op Plan:   Post-operative Plan:   Informed Consent: I have reviewed the patients History and Physical, chart, labs and discussed the procedure including the risks, benefits and alternatives for the proposed anesthesia with the patient or authorized representative who has indicated his/her understanding and acceptance.   Dental advisory given  Plan Discussed with: CRNA  Anesthesia Plan Comments:         Anesthesia Quick Evaluation

## 2014-06-08 NOTE — Transfer of Care (Signed)
Immediate Anesthesia Transfer of Care Note  Patient: Kenneth [name]  Procedure(s) Performed: Procedure(s) (LRB): RIGHT TOTAL KNEE ARTHROPLASTY (Right)  Patient Location: PACU  Anesthesia Type: Spinal  Level of Consciousness: awake, patient cooperative and responds to stimulation  Airway & Oxygen Therapy: Patient Spontanous Breathing and Patient connected to face mask oxgen  Post-op Assessment: Report given to PACU RN and Post -op Vital signs reviewed and stable  Post vital signs: Reviewed and stable  Complications: No apparent anesthesia complications

## 2014-06-08 NOTE — Anesthesia Procedure Notes (Signed)
Spinal  Patient location during procedure: OR Start time: 06/08/2014 7:20 AM End time: 06/08/2014 7:28 AM Staffing Anesthesiologist: Nickie Retort CRNA/Resident: Darlys Gales R Performed by: resident/CRNA and anesthesiologist  Preanesthetic Checklist Completed: patient identified, site marked, surgical consent, pre-op evaluation, timeout performed, IV checked, risks and benefits discussed and monitors and equipment checked Spinal Block Patient position: sitting Prep: Betadine Patient monitoring: heart rate, cardiac monitor, continuous pulse ox and blood pressure Approach: midline Location: L2-3 Injection technique: single-shot Needle Needle type: Spinocan  Needle gauge: 22 G Needle length: 9 cm Needle insertion depth: 8 cm Assessment Sensory level: T4

## 2014-06-08 NOTE — Anesthesia Postprocedure Evaluation (Signed)
Anesthesia Post Note  Patient: Kenneth [name]  Procedure(s) Performed: Procedure(s) (LRB): RIGHT TOTAL KNEE ARTHROPLASTY (Right)  Anesthesia type: Spinal  Patient location: PACU  Post pain: Pain level controlled  Post assessment: Post-op Vital signs reviewed  Last Vitals: BP 116/72  Pulse 73  Temp(Src) 36.8 C (Oral)  Resp 16  Ht 5\' 8"  (1.727 m)  Wt 226 lb (102.513 kg)  BMI 34.37 kg/m2  SpO2 98%  Post vital signs: Reviewed  Level of consciousness: sedated  Complications: No apparent anesthesia complications

## 2014-06-08 NOTE — Op Note (Signed)
Pre-operative diagnosis- Osteoarthritis  Right knee(s)  Post-operative diagnosis- Osteoarthritis Right knee(s)  Procedure-  Right  Total Knee Arthroplasty  Surgeon- Dione Plover. Scotti Motter, MD  Assistant- Ardeen Jourdain, PA-C   Anesthesia-  Spinal  EBL-* No blood loss amount entered *   Drains Hemovac  Tourniquet time-  Total Tourniquet Time Documented: Thigh (Right) - 46 minutes Total: Thigh (Right) - 46 minutes     Complications- None  Condition-PACU - hemodynamically stable.   Brief Clinical Note  Kenneth [name] is a 68 y.o. year old male with end stage OA of her left knee with progressively worsening pain and dysfunction. He has constant pain, with activity and at rest and significant functional deficits with difficulties even with ADLs. He has had extensive non-op management including analgesics, injections of cortisone and viscosupplements, and home exercise program, but remains in significant pain with significant dysfunction. Radiographs show bone on bone arthritis medial and patellofemoral. She presents now for left Total Knee Arthroplasty.     Procedure in detail---   The patient is brought into the operating room and positioned supine on the operating table. After successful administration of  Spinal,   a tourniquet is placed high on the  Right thigh(s) and the lower extremity is prepped and draped in the usual sterile fashion. Time out is performed by the operating team and then the  Right lower extremity is wrapped in Esmarch, knee flexed and the tourniquet inflated to 300 mmHg.       A midline incision is made with a ten blade through the subcutaneous tissue to the level of the extensor mechanism. A fresh blade is used to make a medial parapatellar arthrotomy. Soft tissue over the proximal medial tibia is subperiosteally elevated to the joint line with a knife and into the semimembranosus bursa with a Cobb elevator. Soft tissue over the proximal lateral tibia is elevated with  attention being paid to avoiding the patellar tendon on the tibial tubercle. The patella is everted, knee flexed 90 degrees and the ACL and PCL are removed. Findings are bone on bone medial and patellofemoral with massive global osteophytes.        The drill is used to create a starting hole in the distal femur and the canal is thoroughly irrigated with sterile saline to remove the fatty contents. The 5 degree Right  valgus alignment guide is placed into the femoral canal and the distal femoral cutting block is pinned to remove 9 mm off the distal femur. Resection is made with an oscillating saw.      The tibia is subluxed forward and the menisci are removed. The extramedullary alignment guide is placed referencing proximally at the medial aspect of the tibial tubercle and distally along the second metatarsal axis and tibial crest. The block is pinned to remove 36mm off the more deficient medial  side. Resection is made with an oscillating saw. Size 8is the most appropriate size for the tibia and the proximal tibia is prepared with the modular drill and keel punch for that size.      The femoral sizing guide is placed and size 8 is most appropriate. Rotation is marked off the epicondylar axis and confirmed by creating a rectangular flexion gap at 90 degrees. The size 8 cutting block is pinned in this rotation and the anterior, posterior and chamfer cuts are made with the oscillating saw. The intercondylar block is then placed and that cut is made.      Trial size 8 tibial component,  trial size 8 posterior stabilized femur and a 6  mm posterior stabilized rotating platform insert trial is placed. Full extension is achieved with excellent varus/valgus and anterior/posterior balance throughout full range of motion. The patella is everted and thickness measured to be 27  mm. Free hand resection is taken to 15 mm, a 41 template is placed, lug holes are drilled, trial patella is placed, and it tracks normally.  Osteophytes are removed off the posterior femur with the trial in place. All trials are removed and the cut bone surfaces prepared with pulsatile lavage. Cement is mixed and once ready for implantation, the size 8 tibial implant, size  8 posterior stabilized femoral component, and the size 41 patella are cemented in place and the patella is held with the clamp. The trial insert is placed and the knee held in full extension. The Exparel (20 ml mixed with 30 ml saline) and .25% Bupivicaine, are injected into the extensor mechanism, posterior capsule, medial and lateral gutters and subcutaneous tissues.  All extruded cement is removed and once the cement is hard the permanent 6 mm posterior stabilized rotating platform insert is placed into the tibial tray.      The wound is copiously irrigated with saline solution and the extensor mechanism closed over a hemovac drain with #1 V-loc suture. The tourniquet is released for a total tourniquet time of 44  minutes. Flexion against gravity is 135 degrees and the patella tracks normally. Subcutaneous tissue is closed with 2.0 vicryl and subcuticular with running 4.0 Monocryl. The incision is cleaned and dried and steri-strips and a bulky sterile dressing are applied. The limb is placed into a knee immobilizer and the patient is awakened and transported to recovery in stable condition.      Please note that a surgical assistant was a medical necessity for this procedure in order to perform it in a safe and expeditious manner. Surgical assistant was necessary to retract the ligaments and vital neurovascular structures to prevent injury to them and also necessary for proper positioning of the limb to allow for anatomic placement of the prosthesis.   Dione Plover Effie Janoski, MD    06/08/2014, 8:32 AM

## 2014-06-08 NOTE — Progress Notes (Signed)
CARE MANAGEMENT NOTE 06/08/2014  Patient:  Kenneth [name], Kenneth [name]   Account Number:  [id number]  Date Initiated:  06/08/2014  Documentation initiated by:  DAVIS,RHONDA  Subjective/Objective Assessment:   right total knee     Action/Plan:   home with hhc and dme as needed   Anticipated DC Date:  06/11/2014   Anticipated DC Plan:  [place]  In-house referral  NA      DC Planning Services  CM consult      [facility] Choice  NA   Choice offered to / List presented to:  C-1 Patient   DME arranged  [place]      DME agency  [place]     HH arranged  HH-2 PT      [place].   Status of service:  In process, will continue to follow Medicare Important Message given?  NA - LOS <3 / Initial given by admissions (If response is "NO", the following Medicare IM given date fields will be blank) Date Medicare IM given:   Medicare IM given by:   Date Additional Medicare IM given:   Additional Medicare IM given by:    Discharge Disposition:    Per UR Regulation:  Reviewed for med. necessity/level of care/duration of stay  If discussed at [place] of Stay Meetings, dates discussed:    Comments:  Suanne Marker Davis,RN,BSN,CCM

## 2014-06-08 NOTE — Interval H&P Note (Signed)
History and Physical Interval Note:  06/08/2014 6:54 AM  Kenneth [name]  has presented today for surgery, with the diagnosis of OA RIGHT KNEE  The various methods of treatment have been discussed with the patient and family. After consideration of risks, benefits and other options for treatment, the patient has consented to  Procedure(s): RIGHT TOTAL KNEE ARTHROPLASTY (Right) as a surgical intervention .  The patient's history has been reviewed, patient examined, no change in status, stable for surgery.  I have reviewed the patient's chart and labs.  Questions were answered to the patient's satisfaction.     Gearlean Alf

## 2014-06-08 NOTE — Evaluation (Signed)
Physical Therapy Evaluation Patient Details Name: Kenneth [name] MRN: [id number] DOB: 03-25-1946 Today's Date: 06/08/2014   History of Present Illness  Pt is a 68 year old male s/p [name] with hx of CAD, MI, DM, CHF.  Clinical Impression  Pt is s/p [name] resulting in the deficits listed below (see PT Problem List).  Pt will benefit from skilled PT to increase their independence and safety with mobility to allow discharge to the venue listed below.  Pt plans to d/c home with son in [place] prior to returning to his home alone.  Pt reports he has RW.  Pt very pleasant and cooperative.    Follow Up Recommendations Home health PT    Equipment Recommendations  None recommended by PT    Recommendations for Other Services       Precautions / Restrictions Precautions Precautions: Knee Required Braces or Orthoses: Knee Immobilizer - Right Knee Immobilizer - Right: Discontinue once straight leg raise with < 10 degree lag Restrictions Other Position/Activity Restrictions: WBAT      Mobility  Bed Mobility Overal bed mobility: Needs Assistance Bed Mobility: Supine to Sit     Supine to sit: Min assist;HOB elevated     General bed mobility comments: verbal cues for technique, assist for R LE  Transfers Overall transfer level: Needs assistance Equipment used: Rolling walker (2 wheeled) Transfers: Sit to/from Stand Sit to Stand: Min assist         General transfer comment: verbal cues for safe technique including UE and LE positioning  Ambulation/Gait Ambulation/Gait assistance: Min assist Ambulation Distance (Feet): 50 Feet Assistive device: Rolling walker (2 wheeled) Gait Pattern/deviations: Step-to pattern;Antalgic;Trunk flexed     General Gait Details: verbal cues for sequence, RW distance, posture  Stairs            Wheelchair Mobility    Modified Rankin (Stroke Patients Only)       Balance                                              Pertinent Vitals/Pain Pain Assessment: 0-10 Pain Score: 1  Pain Location: R knee Pain Descriptors / Indicators: Aching Pain Intervention(s): Monitored during session;Premedicated before session;Repositioned;Ice applied    Home Living Family/patient expects to be discharged to:: Private residence Living Arrangements: Alone (to go home with son in [place] upon d/c) Available Help at Discharge: Family Type of Home: House Home Access: Level entry     Home Layout: One level Home Equipment: Environmental consultant - 2 wheels Additional Comments: above is son's home set up    Prior Function Level of Independence: Independent               Hand Dominance        Extremity/Trunk Assessment               Lower Extremity Assessment: RLE deficits/detail RLE Deficits / Details: good quad strength, unable to perform SLR without lag, maintained KI       Communication   Communication: No difficulties  Cognition Arousal/Alertness: Awake/alert Behavior During Therapy: WFL for tasks assessed/performed Overall Cognitive Status: Within Functional Limits for tasks assessed                      General Comments      Exercises        Assessment/Plan  PT Assessment Patient needs continued PT services  PT Diagnosis Difficulty walking   PT Problem List Decreased strength;Decreased range of motion;Decreased mobility;Decreased knowledge of precautions;Decreased knowledge of use of DME;Pain  PT Treatment Interventions Functional mobility training;Patient/family education;Gait training;DME instruction;Therapeutic activities;Therapeutic exercise   PT Goals (Current goals can be found in the Care Plan section) Acute Rehab PT Goals PT Goal Formulation: With patient Time For Goal Achievement: 06/13/14 Potential to Achieve Goals: Good    Frequency 7X/week   Barriers to discharge        Co-evaluation               End of Session Equipment Utilized During Treatment:  Right knee immobilizer Activity Tolerance: Patient tolerated treatment well Patient left: in chair;with call bell/phone within reach           Time: 1445-1501 PT Time Calculation (min): 16 min   Charges:   PT Evaluation $Initial PT Evaluation Tier I: 1 Procedure PT Treatments $Gait Training: 8-22 mins   PT G Codes:          Kamila Broda,KATHrine E 06/08/2014, 3:09 PM Carmelia Bake, PT, DPT 06/08/2014 Pager: [phone]

## 2014-06-09 ENCOUNTER — Encounter (HOSPITAL_COMMUNITY): Payer: Self-pay | Admitting: Orthopedic Surgery

## 2014-06-09 LAB — BASIC METABOLIC PANEL
Anion gap: 10 (ref 5–15)
BUN: 22 mg/dL (ref 6–23)
CO2: 24 meq/L (ref 19–32)
CREATININE: 1.17 mg/dL (ref 0.50–1.35)
Calcium: 8.2 mg/dL — ABNORMAL LOW (ref 8.4–10.5)
Chloride: 100 mEq/L (ref 96–112)
GFR calc Af Amer: 73 mL/min — ABNORMAL LOW (ref >90)
GFR, EST NON AFRICAN AMERICAN: 63 mL/min — AB (ref >90)
GLUCOSE: 171 mg/dL — AB (ref 70–99)
Potassium: 4.8 mEq/L (ref 3.7–5.3)
Sodium: 134 mEq/L — ABNORMAL LOW (ref 137–147)

## 2014-06-09 LAB — CBC
HCT: 32.9 % — ABNORMAL LOW (ref 39.0–52.0)
Hemoglobin: 11.3 g/dL — ABNORMAL LOW (ref 13.0–17.0)
MCH: 31.4 pg (ref 26.0–34.0)
MCHC: 34.3 g/dL (ref 30.0–36.0)
MCV: 91.4 fL (ref 78.0–100.0)
Platelets: 164 10*3/uL (ref 150–400)
RBC: 3.6 MIL/uL — AB (ref 4.22–5.81)
RDW: 13.5 % (ref 11.5–15.5)
WBC: 14.9 10*3/uL — ABNORMAL HIGH (ref 4.0–10.5)

## 2014-06-09 MED ORDER — ENOXAPARIN SODIUM 30 MG/0.3ML ~~LOC~~ SOLN
30.0000 mg | Freq: Two times a day (BID) | SUBCUTANEOUS | Status: DC
  Administered 2014-06-09 – 2014-06-10 (×3): 30 mg via SUBCUTANEOUS
  Filled 2014-06-09 (×5): qty 0.3

## 2014-06-09 NOTE — Progress Notes (Signed)
Physical Therapy Treatment Patient Details Name: Kenneth [name] MRN: [id number] DOB: 11-29-1945 Today's Date: 06/09/2014    History of Present Illness Pt is a 68 year old male s/p [name] with hx of CAD, MI, DM, CHF.    PT Comments    POD # 1 pm session.  Amb in hallway then performed all sitting TKR TE's then back to bed for CPM.  Pt progressing well and is hopeful to D/C to home tomorrow.  Follow Up Recommendations        Equipment Recommendations       Recommendations for Other Services       Precautions / Restrictions      Mobility  Bed Mobility    MinGuard assist              Transfers    MinGuard assist with increased time                Ambulation/Gait    MinGuard assist 220 feet RW good alternating gait.             Stairs            Wheelchair Mobility    Modified Rankin (Stroke Patients Only)       Balance                                    Cognition                            Exercises  10 reps sitting knee flex 10 reps sitting assisted knee flex 10 reps LAQ's Followed by ice    General Comments        Pertinent Vitals/Pain      Home Living                      Prior Function            PT Goals (current goals can now be found in the care plan section)      Frequency       PT Plan      Co-evaluation             End of Session           Time: 8127-5170 PT Time Calculation (min): 25 min  Charges:  $Gait Training: 8-22 mins $Therapeutic Exercise: 8-22                     G Codes:      Rica Koyanagi  PTA [facility]  Acute  Rehab Pager      [phone]

## 2014-06-09 NOTE — Progress Notes (Signed)
Physical Therapy Treatment Patient Details Name: Kenneth [name] MRN: [id number] DOB: 1946/07/27 Today's Date: 06/09/2014    History of Present Illness Pt is a 68 year old male s/p [name] with hx of CAD, MI, DM, CHF.    PT Comments    POD # 1 am session.  Assisted OOB to amb in hallway.  Performed all supine TKR TE's followed by ICE.  Follow Up Recommendations        Equipment Recommendations       Recommendations for Other Services       Precautions / Restrictions      Mobility  Bed Mobility    supervision increased time              Transfers    Min Guard assist OOB and recliner with one VC on proper hand placement with stand to sit.                Ambulation/Gait    Min Guard assist 185 feet RW with one VC on proper walker to self distance             Financial trader Rankin (Stroke Patients Only)       Balance                                    Cognition                            Exercises   Total Knee Replacement TE's 10 reps B LE ankle pumps 10 reps towel squeezes 10 reps knee presses 10 reps heel slides  10 reps SAQ's 10 reps SLR's 10 reps ABD Followed by ICE     General Comments        Pertinent Vitals/Pain      Home Living                      Prior Function            PT Goals (current goals can now be found in the care plan section)      Frequency       PT Plan      Co-evaluation             End of Session           Time: 1010-1048 PT Time Calculation (min): 38 min  Charges:  $Gait Training: 8-22 mins $Therapeutic Exercise: 8-22 mins $Therapeutic Activity: 8-22 mins                    G Codes:      Rica Koyanagi  PTA [facility]  Acute  Rehab Pager      [phone]

## 2014-06-09 NOTE — Progress Notes (Addendum)
Subjective: 1 Day Post-Op Procedure(s) (LRB): RIGHT TOTAL KNEE ARTHROPLASTY (Right) Patient reports pain as mild.  Rough night because of not much sleep. Patient seen in rounds with Dr. Wynelle Link.  Pain controlled. Patient is well, and has had no acute complaints or problems We will start therapy today.  Plan is to go Home after hospital stay. He did not want to take the Xarelto so he is switched to Lovenox.  Objective: Vital signs in last 24 hours: Temp:  [97.4 F (36.3 C)-98.2 F (36.8 C)] 97.7 F (36.5 C) (08/11 0458) Pulse Rate:  [56-75] 57 (08/11 0458) Resp:  [13-18] 16 (08/11 0458) BP: (96-120)/(58-82) 107/68 mmHg (08/11 0458) SpO2:  [97 %-100 %] 99 % (08/11 0458) Weight:  [102.513 kg (226 lb)] 102.513 kg (226 lb) (08/10 1030)  Intake/Output from previous day:  Intake/Output Summary (Last 24 hours) at 06/09/14 0900 Last data filed at 06/09/14 0600  Gross per 24 hour  Intake 2956.67 ml  Output   1815 ml  Net 1141.67 ml    Intake/Output this shift:    Labs:  Recent Labs  06/09/14 0442  HGB 11.3*    Recent Labs  06/09/14 0442  WBC 14.9*  RBC 3.60*  HCT 32.9*  PLT 164    Recent Labs  06/09/14 0442  NA 134*  K 4.8  CL 100  CO2 24  BUN 22  CREATININE 1.17  GLUCOSE 171*  CALCIUM 8.2*   No results found for this basename: LABPT, INR,  in the last 72 hours  EXAM General - Patient is Alert, Appropriate and Oriented Extremity - Neurovascular intact Sensation intact distally Dorsiflexion/Plantar flexion intact Dressing - dressing C/D/I Motor Function - intact, moving foot and toes well on exam.  Hemovac pulled without difficulty.  Past Medical History  Diagnosis Date  . Hypertension   . Hyperlipidemia   . Tobacco abuse   . Coronary artery disease     a. 1999: s/p PTCA;  b. 11/2012 Cath/PCI: LM mild plaque, LAD 60-40m, LCX 30p, 71m, OM1 11m, RCA 30-40ost, 40-66m, 80-32m/d (3.0x38, 3.0x32, 3.0x24 Promus Premier DESs), 70-80d, EF 40% inf HK;  c.  12/2012 Staged PCI of LAD w/ 3.0x12 Promus Premier DES.   Marland Kitchen Acute on chronic combined systolic and diastolic CHF (congestive heart failure) 09/18/2013  . Squamous cell carcinoma     Back  . Myocardial infarction     '99- MI,"angio[plasty", Stents 3'14  . H/O seasonal allergies   . Diabetes mellitus without complication     type 2 , no oral meds in 2 yrs.     . Elevated uric acid in blood   . Arthritis     osteoarthritis-knees, hands, wrist, fingers    Assessment/Plan: 1 Day Post-Op Procedure(s) (LRB): RIGHT TOTAL KNEE ARTHROPLASTY (Right) Principal Problem:   OA (osteoarthritis) of knee  Estimated body mass index is 34.37 kg/(m^2) as calculated from the following:   Height as of this encounter: 5\' 8"  (1.727 m).   Weight as of this encounter: 102.513 kg (226 lb). Advance diet Up with therapy Plan for discharge tomorrow Discharge home with home health  DVT Prophylaxis - Lovenox Plavix is on hold but the ASA has been resumed. Weight-Bearing as tolerated to right leg D/C O2 and Pulse OX and try on Room Air  Plan is to go home with his son in [place], [place] initially after surgery.  He will do his HHPT in [place] initially and then switch over to outpatient later.  Arlee Muslim, PA-C Orthopaedic  Surgery 06/09/2014, 9:00 AM

## 2014-06-09 NOTE — Evaluation (Signed)
Occupational Therapy Evaluation Patient Details Name: Kenneth [name] MRN: [id number] DOB: 15-Nov-1945 Today's Date: 06/09/2014    History of Present Illness Pt is a 68 year old male s/p [name] with hx of CAD, MI, DM, CHF.   Clinical Impression   Pt up for toilet transfer practice and then to chair. Will be staying with son at d/c. Practiced with AE for LB dressing today. Will benefit from continued OT education to progress ADL independence.     Follow Up Recommendations  No OT follow up;Supervision/Assistance - 24 hour    Equipment Recommendations  None recommended by OT    Recommendations for Other Services       Precautions / Restrictions Precautions Precautions: Knee Restrictions Weight Bearing Restrictions: Yes Other Position/Activity Restrictions: WBAT      Mobility Bed Mobility Overal bed mobility: Needs Assistance Bed Mobility: Supine to Sit     Supine to sit: HOB elevated;Min guard        Transfers Overall transfer level: Needs assistance Equipment used: Rolling walker (2 wheeled) Transfers: Sit to/from Stand Sit to Stand: Min guard         General transfer comment: pt tends to move quickly. guard for safety and verbal cues hand placement.    Balance                                            ADL Overall ADL's : Needs assistance/impaired Eating/Feeding: Independent;Sitting   Grooming: Wash/dry hands;Set up;Sitting   Upper Body Bathing: Set up;Sitting   Lower Body Bathing: Minimal assistance;Sit to/from stand   Upper Body Dressing : Set up;Sitting   Lower Body Dressing: Moderate assistance;Sit to/from stand   Toilet Transfer: Minimal assistance;Ambulation;Comfort height toilet;Grab bars;RW   Toileting- Clothing Manipulation and Hygiene: Minimal assistance;Sit to/from stand         General ADL Comments: Pt states he hasnt been able to reach down and don socks in a long time. He usually cross LEs up. Discussed AE options  for LB dressing and pt is interested in AE kit. Practiced with sock aid to don sock with supervision and min verbal cues. He plans to sponge bathe for a few days initially. He has a higher toilet at son's house and a vanity on R. He did well with sitting and standing from comfort  height commode and grab bar. He declines needing a 3in1 and feel pt will do ok with higher toilet and vanity.      Vision                     Perception     Praxis      Pertinent Vitals/Pain Pain Assessment: 0-10 Pain Score: 4  Pain Location: R knee Pain Descriptors / Indicators: Aching Pain Intervention(s): Repositioned;Ice applied     Hand Dominance     Extremity/Trunk Assessment Upper Extremity Assessment Upper Extremity Assessment: Overall WFL for tasks assessed           Communication Communication Communication: No difficulties   Cognition Arousal/Alertness: Awake/alert Behavior During Therapy: WFL for tasks assessed/performed Overall Cognitive Status: Within Functional Limits for tasks assessed                     General Comments       Exercises       Shoulder Instructions      Home  Living Family/patient expects to be discharged to:: Private residence Living Arrangements: Alone (to go home with son in [place] upon d/c) Available Help at Discharge: Family Type of Home: House Home Access: Level entry     [place]: One level     Bathroom Shower/Tub: Teacher, early years/pre: Handicapped height     Home Equipment: Environmental consultant - 2 wheels   Additional Comments: above is son's home set up      Prior Functioning/Environment Level of Independence: Independent             OT Diagnosis: Generalized weakness   OT Problem List: Decreased strength;Decreased knowledge of use of DME or AE   OT Treatment/Interventions: Self-care/ADL training;Patient/family education;Therapeutic activities;DME and/or AE instruction    OT Goals(Current goals can be  found in the care plan section) Acute Rehab OT Goals Patient Stated Goal: return to independence OT Goal Formulation: With patient Time For Goal Achievement: 06/16/14 Potential to Achieve Goals: Good  OT Frequency: Min 2X/week   Barriers to D/C:            Co-evaluation              End of Session Equipment Utilized During Treatment: Gait belt;Rolling walker CPM Right Knee CPM Right Knee: Off  Activity Tolerance: Patient tolerated treatment well Patient left: in chair;with call bell/phone within reach   Time: 0831-0901 OT Time Calculation (min): 30 min Charges:  OT General Charges $OT Visit: 1 Procedure OT Evaluation $Initial OT Evaluation Tier I: 1 Procedure OT Treatments $Therapeutic Activity: 8-22 mins G-Codes:    Jules Schick [phone] 06/09/2014, 9:18 AM

## 2014-06-10 DIAGNOSIS — D62 Acute posthemorrhagic anemia: Secondary | ICD-10-CM | POA: Diagnosis not present

## 2014-06-10 LAB — BASIC METABOLIC PANEL
Anion gap: 11 (ref 5–15)
BUN: 23 mg/dL (ref 6–23)
CO2: 26 mEq/L (ref 19–32)
CREATININE: 1.06 mg/dL (ref 0.50–1.35)
Calcium: 8.4 mg/dL (ref 8.4–10.5)
Chloride: 99 mEq/L (ref 96–112)
GFR, EST AFRICAN AMERICAN: 83 mL/min — AB (ref >90)
GFR, EST NON AFRICAN AMERICAN: 71 mL/min — AB (ref >90)
Glucose, Bld: 124 mg/dL — ABNORMAL HIGH (ref 70–99)
Potassium: 3.9 mEq/L (ref 3.7–5.3)
Sodium: 136 mEq/L — ABNORMAL LOW (ref 137–147)

## 2014-06-10 LAB — CBC
HEMATOCRIT: 31.6 % — AB (ref 39.0–52.0)
Hemoglobin: 10.8 g/dL — ABNORMAL LOW (ref 13.0–17.0)
MCH: 31 pg (ref 26.0–34.0)
MCHC: 34.2 g/dL (ref 30.0–36.0)
MCV: 90.8 fL (ref 78.0–100.0)
PLATELETS: 195 10*3/uL (ref 150–400)
RBC: 3.48 MIL/uL — ABNORMAL LOW (ref 4.22–5.81)
RDW: 13.7 % (ref 11.5–15.5)
WBC: 16.6 10*3/uL — ABNORMAL HIGH (ref 4.0–10.5)

## 2014-06-10 MED ORDER — ENOXAPARIN SODIUM 40 MG/0.4ML ~~LOC~~ SOLN: 40.0000 mg | SUBCUTANEOUS | Status: DC

## 2014-06-10 MED ORDER — OXYCODONE HCL 5 MG PO TABS: 5.0000 mg | ORAL_TABLET | ORAL | Status: DC | PRN

## 2014-06-10 MED ORDER — ENOXAPARIN SODIUM 30 MG/0.3ML ~~LOC~~ SOLN: 40.0000 mg | SUBCUTANEOUS | Status: DC

## 2014-06-10 MED ORDER — TRAMADOL HCL 50 MG PO TABS: 50.0000 mg | ORAL_TABLET | Freq: Four times a day (QID) | ORAL | Status: DC | PRN

## 2014-06-10 MED ORDER — ENOXAPARIN (LOVENOX) PATIENT EDUCATION KIT
PACK | Freq: Once | Status: AC
  Administered 2014-06-10: 09:00:00
  Filled 2014-06-10: qty 1

## 2014-06-10 MED ORDER — POLYETHYLENE GLYCOL 3350 17 G PO PACK: 17.0000 g | PACK | Freq: Every day | ORAL | Status: DC | PRN

## 2014-06-10 MED ORDER — METHOCARBAMOL 500 MG PO TABS: 500.0000 mg | ORAL_TABLET | Freq: Four times a day (QID) | ORAL | Status: DC | PRN

## 2014-06-10 NOTE — Progress Notes (Signed)
Physical Therapy Treatment Patient Details Name: Kenneth [name] MRN: [id number] DOB: December 17, 1945 Today's Date: 06/10/2014    History of Present Illness Pt is a 68 year old male s/p [name] with hx of CAD, MI, DM, CHF.    PT Comments    POD # 2 pt eager to D/C to son's home today.  Amb pt in hallway while instructed son on safe handling tech and environmental safety. With son present practiced going up/down 2 steps with RW up backward due to no rails.  Instructed on HEP and use of ICE.  Pt ready for D/C.  Follow Up Recommendations  Home health PT     Equipment Recommendations  None recommended by PT    Recommendations for Other Services       Precautions / Restrictions Precautions Precautions: Knee Restrictions Other Position/Activity Restrictions: WBAT    Mobility  Bed Mobility         Supine to sit: Supervision     General bed mobility comments: Pt OOB in recliner  Transfers Overall transfer level: Needs assistance Equipment used: Rolling walker (2 wheeled) Transfers: Sit to/from Stand Sit to Stand: Supervision         General transfer comment: supervision with one VC for safety as pt is impulsive  Ambulation/Gait Ambulation/Gait assistance: Supervision Ambulation Distance (Feet): 85 Feet Assistive device: Rolling walker (2 wheeled) Gait Pattern/deviations: Step-through pattern;Trunk flexed Gait velocity: WFL   General Gait Details: <25% verbal cues for sequence, RW distance, posture   Stairs Stairs: Yes Stairs assistance: Min assist Stair Management: No rails;Step to pattern;Backwards;With walker Number of Stairs: 2 General stair comments: performed with son present for education.  Performed twice.  Wheelchair Mobility    Modified Rankin (Stroke Patients Only)       Balance                                    Cognition Arousal/Alertness: Awake/alert Behavior During Therapy: Impulsive Overall Cognitive Status: Within  Functional Limits for tasks assessed                      Exercises      General Comments General comments (skin integrity, edema, etc.): min guard assist to pull up shorts in standing.      Pertinent Vitals/Pain      Home Living                      Prior Function            PT Goals (current goals can now be found in the care plan section) Progress towards PT goals: Progressing toward goals    Frequency  7X/week    PT Plan      Co-evaluation             End of Session Equipment Utilized During Treatment: Gait belt Activity Tolerance: Patient tolerated treatment well Patient left: in chair;with call bell/phone within reach     Time: 1000-1030 PT Time Calculation (min): 30 min  Charges:  $Gait Training: 8-22 mins $Therapeutic Activity: 8-22 mins                    G Codes:      Rica Koyanagi  PTA [facility]  Acute  Rehab Pager      [phone]

## 2014-06-10 NOTE — Progress Notes (Signed)
   Subjective: 2 Days Post-Op Procedure(s) (LRB): RIGHT TOTAL KNEE ARTHROPLASTY (Right) Patient reports pain as mild.   Patient seen in rounds by Dr. Wynelle Link. Patient is well, and has had no acute complaints or problems Patient is ready to go home  Objective: Vital signs in last 24 hours: Temp:  [97.5 F (36.4 C)-98.5 F (36.9 C)] 98.2 F (36.8 C) (08/12 0524) Pulse Rate:  [67-74] 68 (08/12 0524) Resp:  [16-18] 16 (08/12 0524) BP: (116-147)/(63-76) 127/63 mmHg (08/12 0928) SpO2:  [97 %-100 %] 100 % (08/12 0524)  Intake/Output from previous day:  Intake/Output Summary (Last 24 hours) at 06/10/14 1039 Last data filed at 06/10/14 1031  Gross per 24 hour  Intake    843 ml  Output    650 ml  Net    193 ml    Intake/Output this shift: Total I/O In: 240 [P.O.:240] Out: 300 [Urine:300]  Labs:  Recent Labs  06/09/14 0442 06/10/14 0521  HGB 11.3* 10.8*    Recent Labs  06/09/14 0442 06/10/14 0521  WBC 14.9* 16.6*  RBC 3.60* 3.48*  HCT 32.9* 31.6*  PLT 164 195    Recent Labs  06/09/14 0442 06/10/14 0521  NA 134* 136*  K 4.8 3.9  CL 100 99  CO2 24 26  BUN 22 23  CREATININE 1.17 1.06  GLUCOSE 171* 124*  CALCIUM 8.2* 8.4   No results found for this basename: LABPT, INR,  in the last 72 hours  EXAM: General - Patient is Alert, Appropriate and Oriented Extremity - Neurovascular intact Sensation intact distally Incision - clean, dry, no drainage, healing Motor Function - intact, moving foot and toes well on exam.   Assessment/Plan: 2 Days Post-Op Procedure(s) (LRB): RIGHT TOTAL KNEE ARTHROPLASTY (Right) Procedure(s) (LRB): RIGHT TOTAL KNEE ARTHROPLASTY (Right) Past Medical History  Diagnosis Date  . Hypertension   . Hyperlipidemia   . Tobacco abuse   . Coronary artery disease     a. 1999: s/p PTCA;  b. 11/2012 Cath/PCI: LM mild plaque, LAD 60-48m, LCX 30p, 51m, OM1 54m, RCA 30-40ost, 40-69m, 80-97m/d (3.0x38, 3.0x32, 3.0x24 Promus Premier DESs),  70-80d, EF 40% inf HK;  c. 12/2012 Staged PCI of LAD w/ 3.0x12 Promus Premier DES.   Marland Kitchen Acute on chronic combined systolic and diastolic CHF (congestive heart failure) 09/18/2013  . Squamous cell carcinoma     Back  . Myocardial infarction     '99- MI,"angio[plasty", Stents 3'14  . H/O seasonal allergies   . Diabetes mellitus without complication     type 2 , no oral meds in 2 yrs.     . Elevated uric acid in blood   . Arthritis     osteoarthritis-knees, hands, wrist, fingers   Principal Problem:   OA (osteoarthritis) of knee Active Problems:   Postoperative anemia due to acute blood loss  Estimated body mass index is 34.37 kg/(m^2) as calculated from the following:   Height as of this encounter: 5\' 8"  (1.727 m).   Weight as of this encounter: 102.513 kg (226 lb). Up with therapy Discharge home with home health Diet - Cardiac diet Follow up - in 2 weeks Activity - WBAT Disposition - Home Condition Upon Discharge - Good D/C Meds - See DC Summary DVT Prophylaxis - Aspirin and Lovenox  Arlee Muslim, PA-C Orthopaedic Surgery 06/10/2014, 10:39 AM

## 2014-06-10 NOTE — Discharge Instructions (Signed)

## 2014-06-10 NOTE — Progress Notes (Signed)
Occupational Therapy Treatment Patient Details Name: Kenneth [name] MRN: [id number] DOB: 1946-03-18 Today's Date: 06/10/2014    History of present illness Pt is a 68 year old male s/p [name] with hx of CAD, MI, DM, CHF.   OT comments  Pt supposed to d/c today. Overall doing well with min cues for safety overall. Practiced grooming, dressing and shower transfers today. Son will be with pt at d/c.    Follow Up Recommendations  No OT follow up;Supervision/Assistance - 24 hour    Equipment Recommendations  None recommended by OT    Recommendations for Other Services      Precautions / Restrictions Precautions Precautions: Knee Restrictions Weight Bearing Restrictions: No Other Position/Activity Restrictions: WBAT       Mobility Bed Mobility         Supine to sit: Supervision        Transfers Overall transfer level: Needs assistance Equipment used: Rolling walker (2 wheeled) Transfers: Sit to/from Stand Sit to Stand: Supervision         General transfer comment: min guard assist to sit down as he tried to do so too quickly. supervision to stand.    Balance                                   ADL       Grooming: Wash/dry hands;Oral care;Standing;Supervision/safety                                 General ADL Comments: Pt states he doesnt wear socks much at home and has slippers with grip at home. He asked about going barefoot but recommended pt wear at least slippers initially to have some support and grip on feet. He was able to thread shorts and underwear over LEs from the bed without AE. Cautioned pt to make sure he doesnt twist knee to don clothing. Stood at EOB to pull up clothing with min guard assist. Pt states he may be interested in [facility] so discussed options of where to obtain. He states son has a walk in shower at his house so practiced shower transfer with min guard assist. DIscussed options for a shower seat including a plastic  lawn seat as an option. Advised pt to have assist with initial showers also. Pt verbalized understanding. He attempted to sit down in the recliner today while trying to answer cell phone and plopped back slightly. Advised pt to not try to use phone while trying to transfer /mobilize.       Vision                     Perception     Praxis      Cognition   Behavior During Therapy: Impulsive Overall Cognitive Status: Within Functional Limits for tasks assessed                       Extremity/Trunk Assessment               Exercises     Shoulder Instructions       General Comments      Pertinent Vitals/ Pain       Pain Assessment: No/denies pain  Home Living  Prior Functioning/Environment              Frequency Min 2X/week     Progress Toward Goals  OT Goals(current goals can now be found in the care plan section)  Progress towards OT goals: Progressing toward goals (added shower transfer goal to practice today)     Plan Discharge plan remains appropriate    Co-evaluation                 End of Session Equipment Utilized During Treatment: Rolling walker CPM Right Knee CPM Right Knee: Off   Activity Tolerance Patient tolerated treatment well   Patient Left in chair;with call bell/phone within reach   Nurse Communication          Time: 6861-6837 OT Time Calculation (min): 28 min  Charges: OT General Charges $OT Visit: 1 Procedure OT Treatments $Self Care/Home Management : 8-22 mins $Therapeutic Activity: 8-22 mins  Jules Schick [phone] 06/10/2014, 9:09 AM

## 2014-07-02 NOTE — Discharge Summary (Signed)
Physician Discharge Summary   Patient ID: Kenneth [name] MRN: [id number] DOB/AGE: 05/22/1946 68 y.o.  Admit date: 06/08/2014 Discharge date: 06/10/2014  Primary Diagnosis:  Osteoarthritis Right knee(s)  Admission Diagnoses:  Past Medical History  Diagnosis Date  . Hypertension   . Hyperlipidemia   . Tobacco abuse   . Coronary artery disease     a. 1999: s/p PTCA;  b. 11/2012 Cath/PCI: LM mild plaque, LAD 60-30m LCX 30p, 458mOM1 4010mCA 30-40ost, 40-63m79m-58m/30m.0x38, 3.0x32, 3.0x24 Promus Premier DESs), 70-80d, EF 40% inf HK;  c. 12/2012 Staged PCI of LAD w/ 3.0x12 Promus Premier DES.   . AcuMarland [place] on chronic combined systolic and diastolic CHF (congestive heart failure) 09/18/2013  . Squamous cell carcinoma     Back  . Myocardial infarction     '99- MI,"angio[plasty", Stents 3'14  . H/O seasonal allergies   . Diabetes mellitus without complication     type 2 , no oral meds in 2 yrs.     . Elevated uric acid in blood   . Arthritis     osteoarthritis-knees, hands, wrist, fingers   Discharge Diagnoses:   Principal Problem:   OA (osteoarthritis) of knee Active Problems:   Postoperative anemia due to acute blood loss  Estimated body mass index is 34.37 kg/(m^2) as calculated from the following:   Height as of this encounter: 5' 8"  (1.727 m).   Weight as of this encounter: 102.513 kg (226 lb).  Procedure:  Procedure(s) (LRB): RIGHT TOTAL KNEE ARTHROPLASTY (Right)   Consults: None  HPI: Kenneth [name] 68 y.68 year old male with end stage OA of her left knee with progressively worsening pain and dysfunction. He has constant pain, with activity and at rest and significant functional deficits with difficulties even with ADLs. He has had extensive non-op management including analgesics, injections of cortisone and viscosupplements, and home exercise program, but remains in significant pain with significant dysfunction. Radiographs show bone on bone arthritis medial and  patellofemoral. She presents now for left Total Knee Arthroplasty.   Laboratory Data: Admission on 06/08/2014, Discharged on 06/10/2014  Component Date Value Ref Range Status  . ABO/RH(D) 06/08/2014 A POS   Final  . Antibody Screen 06/08/2014 NEG   Final  . Sample Expiration 06/08/2014 06/11/2014   Final  . Glucose-Capillary 06/08/2014 160* 70 - 99 mg/dL Final  . Comment 1 06/08/2014 Notify RN   Final  . ABO/RH(D) 06/08/2014 A POS   Final  . Glucose-Capillary 06/08/2014 87  70 - 99 mg/dL Final  . WBC 06/09/2014 14.9* 4.0 - 10.5 K/uL Final  . RBC 06/09/2014 3.60* 4.22 - 5.81 MIL/uL Final  . Hemoglobin 06/09/2014 11.3* 13.0 - 17.0 g/dL Final  . HCT 06/09/2014 32.9* 39.0 - 52.0 % Final  . MCV 06/09/2014 91.4  78.0 - 100.0 fL Final  . MCH 06/09/2014 31.4  26.0 - 34.0 pg Final  . MCHC 06/09/2014 34.3  30.0 - 36.0 g/dL Final  . RDW 06/09/2014 13.5  11.5 - 15.5 % Final  . Platelets 06/09/2014 164  150 - 400 K/uL Final  . Sodium 06/09/2014 134* 137 - 147 mEq/L Final  . Potassium 06/09/2014 4.8  3.7 - 5.3 mEq/L Final  . Chloride 06/09/2014 100  96 - 112 mEq/L Final  . CO2 06/09/2014 24  19 - 32 mEq/L Final  . Glucose, Bld 06/09/2014 171* 70 - 99 mg/dL Final  . BUN 06/09/2014 22  6 - 23 mg/dL Final  . Creatinine, Ser 06/09/2014 1.17  0.50 - 1.35 mg/dL Final  . Calcium 06/09/2014 8.2* 8.4 - 10.5 mg/dL Final  . GFR calc non Af Amer 06/09/2014 63* >90 mL/min Final  . GFR calc Af Amer 06/09/2014 73* >90 mL/min Final   Comment: (NOTE)                          The eGFR has been calculated using the CKD EPI equation.                          This calculation has not been validated in all clinical situations.                          eGFR's persistently <90 mL/min signify possible Chronic Kidney                          Disease.  . Anion gap 06/09/2014 10  5 - 15 Final  . WBC 06/10/2014 16.6* 4.0 - 10.5 K/uL Final  . RBC 06/10/2014 3.48* 4.22 - 5.81 MIL/uL Final  . Hemoglobin 06/10/2014 10.8*  13.0 - 17.0 g/dL Final  . HCT 06/10/2014 31.6* 39.0 - 52.0 % Final  . MCV 06/10/2014 90.8  78.0 - 100.0 fL Final  . MCH 06/10/2014 31.0  26.0 - 34.0 pg Final  . MCHC 06/10/2014 34.2  30.0 - 36.0 g/dL Final  . RDW 06/10/2014 13.7  11.5 - 15.5 % Final  . Platelets 06/10/2014 195  150 - 400 K/uL Final  . Sodium 06/10/2014 136* 137 - 147 mEq/L Final  . Potassium 06/10/2014 3.9  3.7 - 5.3 mEq/L Final   DELTA CHECK NOTED  . Chloride 06/10/2014 99  96 - 112 mEq/L Final  . CO2 06/10/2014 26  19 - 32 mEq/L Final  . Glucose, Bld 06/10/2014 124* 70 - 99 mg/dL Final  . BUN 06/10/2014 23  6 - 23 mg/dL Final  . Creatinine, Ser 06/10/2014 1.06  0.50 - 1.35 mg/dL Final  . Calcium 06/10/2014 8.4  8.4 - 10.5 mg/dL Final  . GFR calc non Af Amer 06/10/2014 71* >90 mL/min Final  . GFR calc Af Amer 06/10/2014 83* >90 mL/min Final   Comment: (NOTE)                          The eGFR has been calculated using the CKD EPI equation.                          This calculation has not been validated in all clinical situations.                          eGFR's persistently <90 mL/min signify possible Chronic Kidney                          Disease.  Kenneth [name] gap 06/10/2014 11  5 - 15 Final  Hospital Outpatient Visit on 06/01/2014  Component Date Value Ref Range Status  . MRSA, PCR 06/01/2014 NEGATIVE  NEGATIVE Final  . Staphylococcus aureus 06/01/2014 NEGATIVE  NEGATIVE Final   Comment:  The Xpert SA Assay (FDA                          approved for NASAL specimens                          in patients over 78 years of age),                          is one component of                          a comprehensive surveillance                          program.  Test performance has                          been validated by [employer] for patients greater                          than or equal to 19 year old.                          It is not intended                           to diagnose infection nor to                          guide or monitor treatment.  Marland Kitchen aPTT 06/01/2014 33  24 - 37 seconds Final  . WBC 06/01/2014 5.8  4.0 - 10.5 K/uL Final  . RBC 06/01/2014 4.42  4.22 - 5.81 MIL/uL Final  . Hemoglobin 06/01/2014 13.9  13.0 - 17.0 g/dL Final  . HCT 06/01/2014 39.4  39.0 - 52.0 % Final  . MCV 06/01/2014 89.1  78.0 - 100.0 fL Final  . MCH 06/01/2014 31.4  26.0 - 34.0 pg Final  . MCHC 06/01/2014 35.3  30.0 - 36.0 g/dL Final  . RDW 06/01/2014 13.0  11.5 - 15.5 % Final  . Platelets 06/01/2014 213  150 - 400 K/uL Final  . Sodium 06/01/2014 135* 137 - 147 mEq/L Final  . Potassium 06/01/2014 5.2  3.7 - 5.3 mEq/L Final   Comment: MODERATE HEMOLYSIS                          HEMOLYSIS AT THIS LEVEL MAY AFFECT RESULT  . Chloride 06/01/2014 100  96 - 112 mEq/L Final  . CO2 06/01/2014 23  19 - 32 mEq/L Final  . Glucose, Bld 06/01/2014 117* 70 - 99 mg/dL Final  . BUN 06/01/2014 27* 6 - 23 mg/dL Final  . Creatinine, Ser 06/01/2014 1.07  0.50 - 1.35 mg/dL Final  . Calcium 06/01/2014 9.5  8.4 - 10.5 mg/dL Final  . Total Protein 06/01/2014 8.1  6.0 - 8.3 g/dL Final  . Albumin 06/01/2014 4.0  3.5 - 5.2 g/dL Final  . AST 06/01/2014 34  0 - 37 U/L Final  Comment: MODERATE HEMOLYSIS                          HEMOLYSIS AT THIS LEVEL MAY AFFECT RESULT  . ALT 06/01/2014 24  0 - 53 U/L Final   Comment: MODERATE HEMOLYSIS                          HEMOLYSIS AT THIS LEVEL MAY AFFECT RESULT  . Alkaline Phosphatase 06/01/2014 61  39 - 117 U/L Final   Comment: MODERATE HEMOLYSIS                          HEMOLYSIS AT THIS LEVEL MAY AFFECT RESULT  . Total Bilirubin 06/01/2014 0.3  0.3 - 1.2 mg/dL Final  . GFR calc non Af Amer 06/01/2014 70* >90 mL/min Final  . GFR calc Af Amer 06/01/2014 82* >90 mL/min Final   Comment: (NOTE)                          The eGFR has been calculated using the CKD EPI equation.                          This calculation has not been  validated in all clinical situations.                          eGFR's persistently <90 mL/min signify possible Chronic Kidney                          Disease.  . Anion gap 06/01/2014 12  5 - 15 Final  . Prothrombin Time 06/01/2014 14.1  11.6 - 15.2 seconds Final  . INR 06/01/2014 1.09  0.00 - 1.49 Final  . Color, Urine 06/01/2014 YELLOW  YELLOW Final  . APPearance 06/01/2014 CLEAR  CLEAR Final  . Specific Gravity, Urine 06/01/2014 1.007  1.005 - 1.030 Final  . pH 06/01/2014 5.0  5.0 - 8.0 Final  . Glucose, UA 06/01/2014 NEGATIVE  NEGATIVE mg/dL Final  . Hgb urine dipstick 06/01/2014 NEGATIVE  NEGATIVE Final  . Bilirubin Urine 06/01/2014 NEGATIVE  NEGATIVE Final  . Ketones, ur 06/01/2014 NEGATIVE  NEGATIVE mg/dL Final  . Protein, ur 06/01/2014 NEGATIVE  NEGATIVE mg/dL Final  . Urobilinogen, UA 06/01/2014 0.2  0.0 - 1.0 mg/dL Final  . Nitrite 06/01/2014 NEGATIVE  NEGATIVE Final  . Leukocytes, UA 06/01/2014 NEGATIVE  NEGATIVE Final   MICROSCOPIC NOT DONE ON URINES WITH NEGATIVE PROTEIN, BLOOD, LEUKOCYTES, NITRITE, OR GLUCOSE <1000 mg/dL.     X-Rays:No results found.  EKG: Orders placed in visit on 02/10/14  . EKG 12-LEAD     Hospital Course: Boby Eyer is a 68 y.o. who was admitted to [facility]. They were brought to the operating room on 06/08/2014 and underwent Procedure(s): RIGHT TOTAL KNEE ARTHROPLASTY.  Patient tolerated the procedure well and was later transferred to the recovery room and then to the orthopaedic floor for postoperative care.  They were given PO and IV analgesics for pain control following their surgery.  They were given 24 hours of postoperative antibiotics of  Anti-infectives   Start     Dose/Rate Route Frequency Ordered Stop   06/08/14 1400  ceFAZolin (ANCEF) IVPB 2 g/50 mL premix     2  g 100 mL/hr over 30 Minutes Intravenous Every 6 hours 06/08/14 1057 06/08/14 2056   06/08/14 0535  ceFAZolin (ANCEF) IVPB 2 g/50 mL premix     2 g 100 mL/hr over  30 Minutes Intravenous On call to O.R. 06/08/14 0535 06/08/14 0730     and started on DVT prophylaxis in the form of Xarelto but later switched to Xarelto.   PT and OT were ordered for total joint protocol.  Discharge planning consulted to help with postop disposition and equipment needs.  Patient had a rough night on the evening of surgery.  They started to get up OOB with therapy on day one. Hemovac drain was pulled without difficulty.  Continued to work with therapy into day two.  Dressing was changed on day two and the incision was healing well and feeling better overall. Patient was seen in rounds and was ready to go home.  Discharge home with home health  Diet - Cardiac diet  Follow up - in 2 weeks  Activity - WBAT  Disposition - Home  Condition Upon Discharge - Good  D/C Meds - See DC Summary  DVT Prophylaxis - Aspirin and Lovenox       Discharge Instructions   Call MD / Call 911    Complete by:  As directed   If you experience chest pain or shortness of breath, CALL 911 and be transported to the hospital emergency room.  If you develope a fever above 101 F, pus (white drainage) or increased drainage or redness at the wound, or calf pain, call your surgeon's office.     Change dressing    Complete by:  As directed   Change dressing daily with sterile 4 x 4 inch gauze dressing and apply TED hose. Do not submerge the incision under water.     Constipation Prevention    Complete by:  As directed   Drink plenty of fluids.  Prune juice may be helpful.  You may use a stool softener, such as Colace (over the counter) 100 mg twice a day.  Use MiraLax (over the counter) for constipation as needed.     Diet - low sodium heart healthy    Complete by:  As directed      Discharge instructions    Complete by:  As directed   Pick up stool softner and laxative for home. Do not submerge incision under water. May shower. Continue to use ice for pain and swelling from surgery.  Lovenox for ten  days and then resume the Plavix 75 mg daily.     Do not put a pillow under the knee. Place it under the heel.    Complete by:  As directed      Do not sit on low chairs, stoools or toilet seats, as it may be difficult to get up from low surfaces    Complete by:  As directed      Driving restrictions    Complete by:  As directed   No driving until released by the physician.     Increase activity slowly as tolerated    Complete by:  As directed      Lifting restrictions    Complete by:  As directed   No lifting until released by the physician.     Patient may shower    Complete by:  As directed   You may shower without a dressing once there is no drainage.  Do not wash over the wound.  If  drainage remains, do not shower until drainage stops.     TED hose    Complete by:  As directed   Use stockings (TED hose) for 3 weeks on both leg(s).  You may remove them at night for sleeping.     Weight bearing as tolerated    Complete by:  As directed             Medication List    STOP taking these medications       clopidogrel 75 MG tablet  Commonly known as:  PLAVIX     multivitamin with minerals Tabs tablet     naproxen sodium 220 MG tablet  Commonly known as:  ANAPROX      TAKE these medications       allopurinol 300 MG tablet  Commonly known as:  ZYLOPRIM  Take 300 mg by mouth daily.     amLODipine 10 MG tablet  Commonly known as:  NORVASC  Take 5 mg by mouth every morning.     aspirin 81 MG chewable tablet  Chew 81 mg by mouth daily.     carvedilol 6.25 MG tablet  Commonly known as:  COREG  Take 1 tablet (6.25 mg total) by mouth 2 (two) times daily.     cetirizine 10 MG tablet  Commonly known as:  ZYRTEC  Take 10 mg by mouth daily as needed for allergies.     colchicine 0.6 MG tablet  Take 0.6 mg by mouth daily as needed (Gout).     enoxaparin 40 MG/0.4ML injection  Commonly known as:  LOVENOX  Inject 0.4 mLs (40 mg total) into the skin daily. Lovenox  injections for ten more days and then resume the Plavix 75 mg daily.     ezetimibe 10 MG tablet  Commonly known as:  ZETIA  Take 10 mg by mouth at bedtime.     fluticasone 50 MCG/ACT nasal spray  Commonly known as:  FLONASE  Place 2 sprays into the nose daily as needed for allergies.     furosemide 40 MG tablet  Commonly known as:  LASIX  Take 40 mg by mouth daily.     lisinopril 40 MG tablet  Commonly known as:  PRINIVIL,ZESTRIL  Take 20 mg by mouth every morning.     methocarbamol 500 MG tablet  Commonly known as:  ROBAXIN  Take 1 tablet (500 mg total) by mouth every 6 (six) hours as needed for muscle spasms.     nitroGLYCERIN 0.4 MG SL tablet  Commonly known as:  NITROSTAT  Place 1 tablet (0.4 mg total) under the tongue every 5 (five) minutes x 3 doses as needed for chest pain.     oxyCODONE 5 MG immediate release tablet  Commonly known as:  Oxy IR/ROXICODONE  Take 1-2 tablets (5-10 mg total) by mouth every 3 (three) hours as needed for moderate pain, severe pain or breakthrough pain.     tetrahydrozoline 0.05 % ophthalmic solution  Place 1 drop into both eyes 2 (two) times daily as needed (Itchy eyes).     traMADol 50 MG tablet  Commonly known as:  ULTRAM  Take 1-2 tablets (50-100 mg total) by mouth every 6 (six) hours as needed (mild pain).       Follow-up Information   Follow up with Gearlean Alf, MD. Schedule an appointment as soon as possible for a visit on 06/23/2014. (Call office at [phone] for appointment time on the 25th.)    Specialty:  Orthopedic Surgery  Contact information:   [address] [place] [address] [phone]       Signed: Arlee Muslim, PA-C Orthopaedic Surgery 07/02/2014, 6:14 AM

## 2014-07-09 ENCOUNTER — Encounter: Payer: Self-pay | Admitting: Cardiology

## 2014-07-09 ENCOUNTER — Ambulatory Visit (INDEPENDENT_AMBULATORY_CARE_PROVIDER_SITE_OTHER): Payer: Commercial Managed Care - HMO | Admitting: Cardiology

## 2014-07-09 VITALS — BP 112/68 | HR 81 | Ht 68.0 in | Wt 215.1 lb

## 2014-07-09 DIAGNOSIS — I509 Heart failure, unspecified: Secondary | ICD-10-CM

## 2014-07-09 DIAGNOSIS — Z9861 Coronary angioplasty status: Secondary | ICD-10-CM

## 2014-07-09 DIAGNOSIS — I2510 Atherosclerotic heart disease of native coronary artery without angina pectoris: Secondary | ICD-10-CM

## 2014-07-09 DIAGNOSIS — I5042 Chronic combined systolic (congestive) and diastolic (congestive) heart failure: Secondary | ICD-10-CM

## 2014-07-09 DIAGNOSIS — I10 Essential (primary) hypertension: Secondary | ICD-10-CM

## 2014-07-09 NOTE — Patient Instructions (Signed)
Reduce lasix to 20 mg daily. If your weight is stable and you are not having swelling you can try and stop lasix in one month and use as needed.  Continue your other therapy  I will see you in 6 months.

## 2014-07-10 NOTE — Progress Notes (Signed)
Kenneth [name] Date of Birth: Feb 09, 1946 Medical Record #[id number]  History of Present Illness: Kenneth [name] is seen for followup. He has a long-standing history of coronary disease. He had angioplasty 1999. He was admitted in February 2014 with unstable angina. Cardiac catheterization at that time demonstrated severe disease in the right coronary and in the LAD. The right coronary was felt to be the culprit and was treated with 3 Promus  drug-eluting stents. FFR of the LAD was abnormal and 0.71. The patient returned in March for staged stenting of the LAD. On his initial evaluation cardiac catheterization demonstrated an ejection fraction of 40%. An echocardiogram obtained the same day indicated an ejection fraction of 55-60%. In early November 2014 he presented with CHF.  He was treated with diuresis and also given antibiotics for possible bronchitis.  On followup today he is doing well. He had recent right TKR 4 weeks ago. He is now able to walk without discomfort and is more active.  He denies any chest pain or SOB. He has a history of intolerance to lipitor and Crestor even at low doses. He does complain that the diuretics increase his gout symptoms. His weight is down 7 lbs now.  Current Outpatient Prescriptions on File Prior to Visit  Medication Sig Dispense Refill  . allopurinol (ZYLOPRIM) 300 MG tablet Take 300 mg by mouth daily.      Marland Kitchen amLODipine (NORVASC) 10 MG tablet Take 5 mg by mouth every morning.       Marland Kitchen aspirin 81 MG chewable tablet Chew 81 mg by mouth daily.      . carvedilol (COREG) 6.25 MG tablet Take 1 tablet (6.25 mg total) by mouth 2 (two) times daily.  180 tablet  3  . cetirizine (ZYRTEC) 10 MG tablet Take 10 mg by mouth daily as needed for allergies.       Marland Kitchen colchicine 0.6 MG tablet Take 0.6 mg by mouth daily as needed (Gout).      Marland Kitchen ezetimibe (ZETIA) 10 MG tablet Take 10 mg by mouth at bedtime.       . fluticasone (FLONASE) 50 MCG/ACT nasal spray Place 2 sprays into the  nose daily as needed for allergies.       . furosemide (LASIX) 40 MG tablet Take 40 mg by mouth daily.       Marland Kitchen lisinopril (PRINIVIL,ZESTRIL) 40 MG tablet Take 20 mg by mouth every morning.      . nitroGLYCERIN (NITROSTAT) 0.4 MG SL tablet Place 1 tablet (0.4 mg total) under the tongue every 5 (five) minutes x 3 doses as needed for chest pain.  60 tablet  3  . tetrahydrozoline 0.05 % ophthalmic solution Place 1 drop into both eyes 2 (two) times daily as needed (Itchy eyes).       No current facility-administered medications on file prior to visit.    Allergies  Allergen Reactions  . Crestor [Rosuvastatin]     Muscle aches at Crestor 5 mg daily  . Lipitor [Atorvastatin]     Muscle aches at 20 mg daily   . Pravastatin Sodium     Muscle aches at 20 mg and 40 mg daily  . Zocor [Simvastatin-High Dose]     Muscle aches at 20 mg and 40 mg daily    Past Medical History  Diagnosis Date  . Hypertension   . Hyperlipidemia   . Tobacco abuse   . Coronary artery disease     a. 1999: s/p PTCA;  b. 11/2012 Cath/PCI:  LM mild plaque, LAD 60-86m, LCX 30p, 22m, OM1 64m, RCA 30-40ost, 40-23m, 80-49m/d (3.0x38, 3.0x32, 3.0x24 Promus Premier DESs), 70-80d, EF 40% inf HK;  c. 12/2012 Staged PCI of LAD w/ 3.0x12 Promus Premier DES.   Marland Kitchen Acute on chronic combined systolic and diastolic CHF (congestive heart failure) 09/18/2013  . Squamous cell carcinoma     Back  . Myocardial infarction     '99- MI,"angio[plasty", Stents 3'14  . H/O seasonal allergies   . Diabetes mellitus without complication     type 2 , no oral meds in 2 yrs.     . Elevated uric acid in blood   . Arthritis     osteoarthritis-knees, hands, wrist, fingers    Past Surgical History  Procedure Laterality Date  . Coronary angioplasty  1999  . Coronary angioplasty with stent placement  01/14/2013    DES to LAD    Dr Amaziah Ghosh [place]  . Hernia repair  2004 ?  Marland Kitchen Carpal tunnel release Left   . Ankle surgery  1960  . Total knee arthroplasty  Right 06/08/2014    Procedure: RIGHT TOTAL KNEE ARTHROPLASTY;  Surgeon: Gearlean Alf, MD;  Location: [facility];  Service: Orthopedics;  Laterality: Right;    History  Smoking status  . Former Smoker -- 15 years  . Types: Cigars  . Quit date: 09/02/2013  Smokeless tobacco  . Never Used    Comment: rare ciagar on golf days-variable days 1-2 per month    History  Alcohol Use  . Yes    Comment: 6-pack of beer/weekend    Family History  Problem Relation Age of Onset  . Arrhythmia Sister     s/p PPM  . Alzheimer's disease Mother   . Hypertension Mother   . Diabetes Mother   . CVA Father     Review of Systems: As noted in history of present illness.  All other systems were reviewed and are negative.  Physical Exam: BP 112/68  Pulse 81  Ht 5\' 8"  (1.727 m)  Wt 215 lb 1.6 oz (97.569 kg)  BMI 32.71 kg/m2 He is a pleasant white male in no acute distress. HEENT: Normal Neck: JVD  is not elevated. No bruits. No adenopathy or thyromegaly. Lungs:  clear  Cardiovascular: Regular rate and rhythm. Normal S1 and S2. No gallop, murmur, or click. Abdomen: Soft and nontender. No masses or hepatosplenomegaly. Bowel sounds are positive. Extremities: No cyanosis or edema. Pulses are 2+ and symmetric. Skin: Warm and dry Neuro: Alert and oriented x3. Cranial nerves II through XII are intact.  LABORATORY DATA:   Assessment / Plan: 1. Chronic combined systolic and diastolic congestive heart failure. EF 45-50%. We'll continue  lisinopril, and carvedilol.  Will try and taper off lasix- initially going to 20 mg daily and then if no edema taking only prn. He will monitor his weight closely.  Continue  a low-sodium diet.   2. Coronary disease status post complex stenting of the right coronary and of the LAD in February and March of 2014. Patient is committed to dual antiplatelet therapy for now. Given the extensive stenting of the RCA I would favor long term DAPT.  3. Hyperlipidemia. Statin  intolerant. Continue Zetia. He is considering entering a trial for PCSK9 inhibitor.   4. Hypertension-controlled.  5. Knee injury. s/p Right TKR.

## 2014-10-08 ENCOUNTER — Encounter (HOSPITAL_COMMUNITY): Payer: Self-pay | Admitting: Internal Medicine

## 2014-10-16 ENCOUNTER — Other Ambulatory Visit: Payer: Self-pay

## 2014-10-16 MED ORDER — CARVEDILOL 6.25 MG PO TABS: 6.2500 mg | ORAL_TABLET | Freq: Two times a day (BID) | ORAL | Status: DC

## 2015-01-05 ENCOUNTER — Telehealth: Payer: Self-pay | Admitting: Cardiology

## 2015-01-05 NOTE — Telephone Encounter (Signed)
I called and left a message on Kenneth [name]'s voicemail to fax the records for our review.

## 2015-01-05 NOTE — Telephone Encounter (Signed)
Maudie Mercury is calling from Dr. Hoover Brunette office to speak to a nurse about the pt who is currently in the office with Dyspena with exertion. She would like for him to be seen sooner that 3/18. Please f/u   Thanks

## 2015-01-05 NOTE — Telephone Encounter (Signed)
I spoke with patient.  He complains of a couple episodes of SOB.  One episode was 2 weeks ago while sleeping.  He woke up feeling like he was suffocating.  (also had some allergies going on so he didn't think much about it)  Then last Tuesday while playing golf he had to stop and rest due to his SOB.  He went to his PCP.  They drew a BNP and did a chest xray.  Last Saturday night, he had some SOB while going upstairs to use the bathroom.  He comments, "I just don't feel right."  He went back to his PCP today and they recommended that the patient follow up with [place].  Patient denies chest pain and acute SOB.  I made him an appt to see Mickel Baas tomorrow.  Patient verbalized understanding and is in agreement.

## 2015-01-06 ENCOUNTER — Encounter: Payer: Self-pay | Admitting: Cardiology

## 2015-01-06 ENCOUNTER — Ambulatory Visit (INDEPENDENT_AMBULATORY_CARE_PROVIDER_SITE_OTHER): Payer: Commercial Managed Care - HMO | Admitting: Cardiology

## 2015-01-06 VITALS — BP 150/78 | HR 62 | Ht 68.0 in | Wt 207.7 lb

## 2015-01-06 DIAGNOSIS — Z79899 Other long term (current) drug therapy: Secondary | ICD-10-CM

## 2015-01-06 DIAGNOSIS — R0602 Shortness of breath: Secondary | ICD-10-CM

## 2015-01-06 DIAGNOSIS — R5383 Other fatigue: Secondary | ICD-10-CM

## 2015-01-06 DIAGNOSIS — R079 Chest pain, unspecified: Secondary | ICD-10-CM

## 2015-01-06 DIAGNOSIS — Z7901 Long term (current) use of anticoagulants: Secondary | ICD-10-CM

## 2015-01-06 LAB — CBC
HCT: 43.4 % (ref 39.0–52.0)
Hemoglobin: 14.8 g/dL (ref 13.0–17.0)
MCH: 31.5 pg (ref 26.0–34.0)
MCHC: 34.1 g/dL (ref 30.0–36.0)
MCV: 92.3 fL (ref 78.0–100.0)
MPV: 9.8 fL (ref 8.6–12.4)
Platelets: 227 10*3/uL (ref 150–400)
RBC: 4.7 MIL/uL (ref 4.22–5.81)
RDW: 14.8 % (ref 11.5–15.5)
WBC: 7 10*3/uL (ref 4.0–10.5)

## 2015-01-06 LAB — COMPREHENSIVE METABOLIC PANEL
ALBUMIN: 4.3 g/dL (ref 3.5–5.2)
ALT: 19 U/L (ref 0–53)
AST: 20 U/L (ref 0–37)
Alkaline Phosphatase: 71 U/L (ref 39–117)
BUN: 24 mg/dL — AB (ref 6–23)
CALCIUM: 9.4 mg/dL (ref 8.4–10.5)
CO2: 24 meq/L (ref 19–32)
CREATININE: 1 mg/dL (ref 0.50–1.35)
Chloride: 101 mEq/L (ref 96–112)
Glucose, Bld: 114 mg/dL — ABNORMAL HIGH (ref 70–99)
POTASSIUM: 3.9 meq/L (ref 3.5–5.3)
SODIUM: 135 meq/L (ref 135–145)
TOTAL PROTEIN: 7.8 g/dL (ref 6.0–8.3)
Total Bilirubin: 0.9 mg/dL (ref 0.2–1.2)

## 2015-01-06 MED ORDER — ISOSORBIDE MONONITRATE ER 30 MG PO TB24: 30.0000 mg | ORAL_TABLET | Freq: Every day | ORAL | Status: DC

## 2015-01-06 NOTE — Progress Notes (Signed)
Cardiology Office Note   Date:  01/06/2015   ID:  Kenneth [name], DOB Mar 16, 1946, MRN [id number]  PCP:  Velna Hatchet, MD  Cardiologist:  Dr. P. Martinique     Chief Complaint  Patient presents with  . Chest Pain    Increasing SOB over the past 2 weeks with activity.  Saw PCP yesterday - had EKG, CXR and labs.      History of Present Illness: Kenneth [name] is a 69 y.o. male who presents for increasing DOE and chest awareness.  Similar to when he rec'd stents.  He has a long-standing history of coronary disease. He had angioplasty 1999. He was admitted in February 2014 with unstable angina. Cardiac catheterization at that time demonstrated severe disease in the right coronary and in the LAD. The right coronary was felt to be the culprit and was treated with 3 Promus drug-eluting stents. FFR of the LAD was abnormal and 0.71. The patient returned in March for staged stenting of the LAD. On his initial evaluation cardiac catheterization demonstrated an ejection fraction of 40%. An echocardiogram obtained the same day indicated an ejection fraction of 55-60%. In early November 2014 he presented with CHF. He was treated with diuresis and also given antibiotics for possible bronchitis.    His wt is up and his PCP did increase his lasix to 40 mg BID.  He has lost 7 pounds, but despite this he has DOE.  Symptoms began 2 weeks ago  one episode he was climbing the steps rapidly and developed chest awareness as well as the dyspnea on exertion. No diaphoresis no nausea or vomiting.    EKG from his primary care office done 01/04/2005 sinus bradycardia rate of 57 rightward axis he has Q waves that are old into 3 and aVF otherwise no acute changes except for maybe some poor R-wave progression compared to previous EKGs.  Chest x-ray was done March 1 of this year he had mild congestive heart failure and BNP that was done 12/29/2014 was 454. Creatinine is normal at 0.9 being winded 18 and potassium of  4.4.    Past Medical History  Diagnosis Date  . Hypertension   . Hyperlipidemia   . Tobacco abuse   . Coronary artery disease     a. 1999: s/p PTCA;  b. 11/2012 Cath/PCI: LM mild plaque, LAD 60-85m, LCX 30p, 56m, OM1 43m, RCA 30-40ost, 40-50m, 80-38m/d (3.0x38, 3.0x32, 3.0x24 Promus Premier DESs), 70-80d, EF 40% inf HK;  c. 12/2012 Staged PCI of LAD w/ 3.0x12 Promus Premier DES.   Marland Kitchen Acute on chronic combined systolic and diastolic CHF (congestive heart failure) 09/18/2013  . Squamous cell carcinoma     Back  . Myocardial infarction     '99- MI,"angio[plasty", Stents 3'14  . H/O seasonal allergies   . Diabetes mellitus without complication     type 2 , no oral meds in 2 yrs.     . Elevated uric acid in blood   . Arthritis     osteoarthritis-knees, hands, wrist, fingers    Past Surgical History  Procedure Laterality Date  . Coronary angioplasty  1999  . Coronary angioplasty with stent placement  01/14/2013    DES to LAD    Dr Peter Martinique  . Hernia repair  2004 ?  Marland Kitchen Carpal tunnel release Left   . Ankle surgery  1960  . Total knee arthroplasty Right 06/08/2014    Procedure: RIGHT TOTAL KNEE ARTHROPLASTY;  Surgeon: Gearlean Alf, MD;  Location: [facility];  Service: Orthopedics;  Laterality: Right;  . Left heart catheterization with coronary angiogram N/A 12/27/2012    Procedure: LEFT HEART CATHETERIZATION WITH CORONARY ANGIOGRAM;  Surgeon: Jolaine Artist, MD;  Location: [facility] CATH LAB;  Service: Cardiovascular;  Laterality: N/A;  . Percutaneous coronary stent intervention (pci-s) N/A 12/30/2012    Procedure: PERCUTANEOUS CORONARY STENT INTERVENTION (PCI-S);  Surgeon: Peter M Martinique, MD;  Location: [facility] CATH LAB;  Service: Cardiovascular;  Laterality: N/A;  . Percutaneous coronary stent intervention (pci-s) N/A 01/14/2013    Procedure: PERCUTANEOUS CORONARY STENT INTERVENTION (PCI-S);  Surgeon: Peter M Martinique, MD;  Location: [facility] CATH LAB;  Service: Cardiovascular;  Laterality: N/A;      Current Outpatient Prescriptions  Medication Sig Dispense Refill  . allopurinol (ZYLOPRIM) 300 MG tablet Take 300 mg by mouth daily.    Marland Kitchen amLODipine (NORVASC) 10 MG tablet Take 5 mg by mouth every morning.     Marland Kitchen aspirin 81 MG chewable tablet Chew 81 mg by mouth daily.    . carvedilol (COREG) 6.25 MG tablet Take 1 tablet (6.25 mg total) by mouth 2 (two) times daily. 180 tablet 3  . cetirizine (ZYRTEC) 10 MG tablet Take 10 mg by mouth daily as needed for allergies.     Marland Kitchen clopidogrel (PLAVIX) 75 MG tablet Take 1 tablet by mouth daily.    . colchicine 0.6 MG tablet Take 0.6 mg by mouth daily as needed (Gout).    Marland Kitchen ezetimibe (ZETIA) 10 MG tablet Take 10 mg by mouth at bedtime.     . fluticasone (FLONASE) 50 MCG/ACT nasal spray Place 2 sprays into the nose daily as needed for allergies.     . furosemide (LASIX) 40 MG tablet Take 40 mg by mouth 2 (two) times daily.     Marland Kitchen lisinopril (PRINIVIL,ZESTRIL) 40 MG tablet Take 20 mg by mouth every morning.    . Multiple Vitamin (MULTIVITAMIN) tablet Take 1 tablet by mouth daily.    . nitroGLYCERIN (NITROSTAT) 0.4 MG SL tablet Place 1 tablet (0.4 mg total) under the tongue every 5 (five) minutes x 3 doses as needed for chest pain. 60 tablet 3  . Probiotic Product (PROBIOTIC DAILY PO) Take 1 capsule by mouth daily.    Marland Kitchen tetrahydrozoline 0.05 % ophthalmic solution Place 1 drop into both eyes 2 (two) times daily as needed (Itchy eyes).     No current facility-administered medications for this visit.    Allergies:   Crestor; Lipitor; Pravastatin sodium; and Zocor    Social History:  The patient  reports that he quit smoking about 16 months ago. His smoking use included Cigars. He has never used smokeless tobacco. He reports that he drinks alcohol. He reports that he does not use illicit drugs.   Family History:  The patient's family history includes Alzheimer's disease in his mother; Arrhythmia in his sister; CVA in his father; Diabetes in his mother;  Hypertension in his mother.    ROS:  General:no colds or fevers, weight decreased with increase of lasix Skin:no rashes or ulcers HEENT:no blurred vision, no congestion CV:see HPI PUL:see HPI GI:no diarrhea constipation or melena, no indigestion GU:no hematuria, no dysuria MS:no joint pain, no claudication Neuro:no syncope, no lightheadedness Endo:no diabetes, no thyroid disease  Wt Readings from Last 3 Encounters:  01/06/15 207 lb 11.2 oz (94.212 kg)  07/09/14 215 lb 1.6 oz (97.569 kg)  06/08/14 226 lb (102.513 kg)     PHYSICAL EXAM: VS:  BP 150/78 mmHg  Pulse 62  Ht 5\' 8"  (1.727 m)  Wt 207 lb 11.2 oz (94.212 kg)  BMI 31.59 kg/m2 , BMI Body mass index is 31.59 kg/(m^2). General:Pleasant affect, NAD Skin:Warm and dry, brisk capillary refill HEENT:normocephalic, sclera clear, mucus membranes moist Neck:supple, no JVD, no bruits  Heart:S1S2 RRR without murmur, gallup, rub or click Lungs:clear without rales, rhonchi, or wheezes PVG:KKDP, non tender, + BS, do not palpate liver spleen or masses Ext:no lower ext edema, 2+ pedal pulses, 2+ radial pulses Neuro:alert and oriented, MAE, follows commands, + facial symmetry    EKG:  EKG is not ordered today.  See HPI  Recent Labs: 06/01/2014: ALT 24 06/10/2014: BUN 23; Creatinine 1.06; Hemoglobin 10.8*; Platelets 195; Potassium 3.9; Sodium 136*    Lipid Panel    Component Value Date/Time   CHOL 142 12/27/2012 0110   TRIG 116 12/27/2012 0110   HDL 34* 12/27/2012 0110   CHOLHDL 4.2 12/27/2012 0110   VLDL 23 12/27/2012 0110   LDLCALC 85 12/27/2012 0110       Other studies Reviewed: Additional studies/ records that were reviewed today include: Previous cardiac catheterization, EKGs, echo..   ASSESSMENT AND PLAN:  1.  Stable angina and crescendo angina-  discussed doing a stress Myoview versus cardiac catheterization and patient would prefer to do cardiac catheterization.  The patient understands that risks include but are  not limited to stroke (1 in 1000), death (1 in 35), kidney failure [usually temporary] (1 in 500), bleeding (1 in 200), allergic reaction [possibly serious] (1 in 200), and agrees to proceed.  I did review with Dr. Martinique prior to talking with the patient.  Patient also would prefer Dr. Martinique to do the cardiac cath possible we added Imdur to his medical regimen and plans are to do the cardiac catheterization Tuesday the 15th with Dr. Martinique. The patient is aware to go to the emergency room if he has increasing chest discomfort shortness of breath that are not relieved with nitroglycerin where he is very concerned about. Patient agrees to this plan.   2.  Coronary artery disease with previous stents to the RCA in the LAD DES stents. Symptoms he is currently having her reminiscent of previous angina prior to the stents. At that time he stated all his tests were normal as why he does not want to have a stress test.  3.  Hyperlipidemia followed by primary care. He is intolerant to statins he is unsteady a currently.  4. Hypertension mildly elevated today with his continued angina will add Imdur 30 mg daily.   Current medicines are reviewed with the patient today.  The patient Has no concerns regarding medicines.  The following changes have been made:  See above Labs/ tests ordered today include:see above  Disposition:   FU:  see above  Signed, Isaiah Serge, NP  01/06/2015 9:26 AM    [facility] [place], [place] [address], [place], [place] Phone: [phone]; Fax: [phone]

## 2015-01-06 NOTE — Patient Instructions (Addendum)
Your physician has requested that you have a cardiac catheterization Tuesday 01/12/15 by Dr. Peter Martinique. Cardiac catheterization is used to diagnose and/or treat various heart conditions. Doctors may recommend this procedure for a number of different reasons. The most common reason is to evaluate chest pain. Chest pain can be a symptom of coronary artery disease (CAD), and cardiac catheterization can show whether plaque is narrowing or blocking your heart's arteries. This procedure is also used to evaluate the valves, as well as measure the blood flow and oxygen levels in different parts of your heart. For further information please visit [facility]. Please follow instruction sheet, as given.  START Isosorbide 30mg  dailyl.  Your physician has requested that you have an echocardiogram this week. Echocardiography is a painless test that uses sound waves to create images of your heart. It provides your doctor with information about the size and shape of your heart and how well your heart's chambers and valves are working. This procedure takes approximately one hour. There are no restrictions for this procedure.  Your physician recommends that you return for lab work in: Today at [facility] [facility] at the [employer] 1st floor.

## 2015-01-07 ENCOUNTER — Ambulatory Visit (HOSPITAL_BASED_OUTPATIENT_CLINIC_OR_DEPARTMENT_OTHER)
Admission: RE | Admit: 2015-01-07 | Discharge: 2015-01-07 | Disposition: A | Discharge status: Home / Self Care | Payer: Commercial Managed Care - HMO | Source: Ambulatory Visit | Attending: Cardiology | Admitting: Cardiology

## 2015-01-07 ENCOUNTER — Encounter: Payer: Self-pay | Admitting: Cardiology

## 2015-01-07 ENCOUNTER — Other Ambulatory Visit: Payer: Self-pay | Admitting: Cardiology

## 2015-01-07 DIAGNOSIS — R0609 Other forms of dyspnea: Principal | ICD-10-CM

## 2015-01-07 DIAGNOSIS — I2510 Atherosclerotic heart disease of native coronary artery without angina pectoris: Secondary | ICD-10-CM | POA: Diagnosis not present

## 2015-01-07 DIAGNOSIS — Z87891 Personal history of nicotine dependence: Secondary | ICD-10-CM | POA: Diagnosis not present

## 2015-01-07 DIAGNOSIS — E785 Hyperlipidemia, unspecified: Secondary | ICD-10-CM | POA: Diagnosis not present

## 2015-01-07 DIAGNOSIS — R0602 Shortness of breath: Secondary | ICD-10-CM

## 2015-01-07 DIAGNOSIS — Z7982 Long term (current) use of aspirin: Secondary | ICD-10-CM | POA: Diagnosis not present

## 2015-01-07 DIAGNOSIS — I5043 Acute on chronic combined systolic (congestive) and diastolic (congestive) heart failure: Secondary | ICD-10-CM | POA: Diagnosis not present

## 2015-01-07 DIAGNOSIS — Z7902 Long term (current) use of antithrombotics/antiplatelets: Secondary | ICD-10-CM | POA: Diagnosis not present

## 2015-01-07 DIAGNOSIS — Z85828 Personal history of other malignant neoplasm of skin: Secondary | ICD-10-CM | POA: Diagnosis not present

## 2015-01-07 DIAGNOSIS — M1990 Unspecified osteoarthritis, unspecified site: Secondary | ICD-10-CM | POA: Diagnosis not present

## 2015-01-07 DIAGNOSIS — I10 Essential (primary) hypertension: Secondary | ICD-10-CM | POA: Diagnosis present

## 2015-01-07 DIAGNOSIS — I252 Old myocardial infarction: Secondary | ICD-10-CM | POA: Diagnosis not present

## 2015-01-07 DIAGNOSIS — Z9861 Coronary angioplasty status: Secondary | ICD-10-CM | POA: Diagnosis not present

## 2015-01-07 DIAGNOSIS — E119 Type 2 diabetes mellitus without complications: Secondary | ICD-10-CM | POA: Diagnosis not present

## 2015-01-07 DIAGNOSIS — Z79899 Other long term (current) drug therapy: Secondary | ICD-10-CM | POA: Diagnosis not present

## 2015-01-07 DIAGNOSIS — R0789 Other chest pain: Secondary | ICD-10-CM | POA: Diagnosis not present

## 2015-01-07 LAB — PROTIME-INR
INR: 1.23 (ref <1.50)
PROTHROMBIN TIME: 15.5 s — AB (ref 11.6–15.2)

## 2015-01-07 LAB — APTT: aPTT: 35 seconds (ref 24–37)

## 2015-01-07 NOTE — Progress Notes (Signed)
2D Echo Performed 01/07/2015    Marygrace Drought, RCS

## 2015-01-08 ENCOUNTER — Other Ambulatory Visit: Payer: Self-pay

## 2015-01-08 ENCOUNTER — Emergency Department (HOSPITAL_COMMUNITY): Payer: Commercial Managed Care - HMO

## 2015-01-08 ENCOUNTER — Observation Stay (HOSPITAL_COMMUNITY)
Admission: EM | Admit: 2015-01-08 | Discharge: 2015-01-10 | Disposition: A | Discharge status: Home / Self Care | Payer: Commercial Managed Care - HMO | Attending: Cardiology | Admitting: Cardiology

## 2015-01-08 ENCOUNTER — Encounter (HOSPITAL_COMMUNITY): Payer: Self-pay | Admitting: Emergency Medicine

## 2015-01-08 ENCOUNTER — Telehealth: Payer: Self-pay | Admitting: Cardiology

## 2015-01-08 ENCOUNTER — Other Ambulatory Visit (HOSPITAL_COMMUNITY): Payer: Self-pay

## 2015-01-08 DIAGNOSIS — I5043 Acute on chronic combined systolic (congestive) and diastolic (congestive) heart failure: Secondary | ICD-10-CM | POA: Insufficient documentation

## 2015-01-08 DIAGNOSIS — I2510 Atherosclerotic heart disease of native coronary artery without angina pectoris: Secondary | ICD-10-CM

## 2015-01-08 DIAGNOSIS — Z7982 Long term (current) use of aspirin: Secondary | ICD-10-CM | POA: Insufficient documentation

## 2015-01-08 DIAGNOSIS — Z79899 Other long term (current) drug therapy: Secondary | ICD-10-CM | POA: Insufficient documentation

## 2015-01-08 DIAGNOSIS — Z789 Other specified health status: Secondary | ICD-10-CM | POA: Insufficient documentation

## 2015-01-08 DIAGNOSIS — I10 Essential (primary) hypertension: Secondary | ICD-10-CM | POA: Diagnosis present

## 2015-01-08 DIAGNOSIS — E785 Hyperlipidemia, unspecified: Secondary | ICD-10-CM | POA: Diagnosis present

## 2015-01-08 DIAGNOSIS — I252 Old myocardial infarction: Secondary | ICD-10-CM | POA: Insufficient documentation

## 2015-01-08 DIAGNOSIS — R0789 Other chest pain: Principal | ICD-10-CM | POA: Insufficient documentation

## 2015-01-08 DIAGNOSIS — I5042 Chronic combined systolic (congestive) and diastolic (congestive) heart failure: Secondary | ICD-10-CM | POA: Diagnosis present

## 2015-01-08 DIAGNOSIS — M1990 Unspecified osteoarthritis, unspecified site: Secondary | ICD-10-CM | POA: Insufficient documentation

## 2015-01-08 DIAGNOSIS — Z9861 Coronary angioplasty status: Secondary | ICD-10-CM | POA: Insufficient documentation

## 2015-01-08 DIAGNOSIS — Z7902 Long term (current) use of antithrombotics/antiplatelets: Secondary | ICD-10-CM | POA: Insufficient documentation

## 2015-01-08 DIAGNOSIS — Z85828 Personal history of other malignant neoplasm of skin: Secondary | ICD-10-CM | POA: Insufficient documentation

## 2015-01-08 DIAGNOSIS — R079 Chest pain, unspecified: Secondary | ICD-10-CM | POA: Diagnosis present

## 2015-01-08 DIAGNOSIS — Z87891 Personal history of nicotine dependence: Secondary | ICD-10-CM | POA: Insufficient documentation

## 2015-01-08 DIAGNOSIS — E119 Type 2 diabetes mellitus without complications: Secondary | ICD-10-CM | POA: Insufficient documentation

## 2015-01-08 LAB — BASIC METABOLIC PANEL
Anion gap: 10 (ref 5–15)
BUN: 20 mg/dL (ref 6–23)
CO2: 27 mmol/L (ref 19–32)
Calcium: 9.5 mg/dL (ref 8.4–10.5)
Chloride: 99 mmol/L (ref 96–112)
Creatinine, Ser: 1.02 mg/dL (ref 0.50–1.35)
GFR calc Af Amer: 86 mL/min — ABNORMAL LOW (ref >90)
GFR calc non Af Amer: 75 mL/min — ABNORMAL LOW (ref >90)
Glucose, Bld: 102 mg/dL — ABNORMAL HIGH (ref 70–99)
Potassium: 3.8 mmol/L (ref 3.5–5.1)
Sodium: 136 mmol/L (ref 135–145)

## 2015-01-08 LAB — CBC WITH DIFFERENTIAL/PLATELET
Basophils Absolute: 0 10*3/uL (ref 0.0–0.1)
Basophils Relative: 0 % (ref 0–1)
Eosinophils Absolute: 0.1 10*3/uL (ref 0.0–0.7)
Eosinophils Relative: 2 % (ref 0–5)
HCT: 40 % (ref 39.0–52.0)
Hemoglobin: 13.9 g/dL (ref 13.0–17.0)
Lymphocytes Relative: 23 % (ref 12–46)
Lymphs Abs: 1.7 10*3/uL (ref 0.7–4.0)
MCH: 31.1 pg (ref 26.0–34.0)
MCHC: 34.8 g/dL (ref 30.0–36.0)
MCV: 89.5 fL (ref 78.0–100.0)
Monocytes Absolute: 0.6 10*3/uL (ref 0.1–1.0)
Monocytes Relative: 9 % (ref 3–12)
Neutro Abs: 4.7 10*3/uL (ref 1.7–7.7)
Neutrophils Relative %: 66 % (ref 43–77)
Platelets: 206 10*3/uL (ref 150–400)
RBC: 4.47 MIL/uL (ref 4.22–5.81)
RDW: 13.8 % (ref 11.5–15.5)
WBC: 7.1 10*3/uL (ref 4.0–10.5)

## 2015-01-08 LAB — TROPONIN I: Troponin I: <0.03 ng/mL (ref <0.031)

## 2015-01-08 NOTE — ED Provider Notes (Signed)
Patient checked his blood pressure at home tonight 5 PM noted to be 76/40 he immediately took his blood pressure again with his automatic cuff and it was 136/90. Within several minutes other readings included 116/42 and 148/101. Patient also complains of vague discomfort at left chest described as "indigestion," similar to myocardial infarction she suffered in the past, resulting in stents. Patient is scheduled for cardiac catheterization 01/12/2015. On exam no distress lungs clear auscultation heart regular rate and rhythm abdomen nondistended nontender extremities without edema.  Date: 01/08/2015  Rate: 75  Rhythm: normal sinus rhythm  QRS Axis: normal  Intervals: normal  ST/T Wave abnormalities: nonspecific T wave changes  Conduction Disutrbances:none  Narrative Interpretation:   Old EKG Reviewed: No significant change from 01/05/2015 interpreted by me   Orlie Dakin, MD 01/09/15 [phone]

## 2015-01-08 NOTE — ED Provider Notes (Signed)
CSN: [id number]     Arrival date & time 01/08/15  1908 History   First MD Initiated Contact with Patient 01/08/15 2049     Chief Complaint  Patient presents with  . Hypertension     (Consider location/radiation/quality/duration/timing/severity/associated sxs/prior Treatment) HPI   The patient is a 69 y/o with a history of 2 MIs (1999 and 2013) and CHF who presents to the emergency department for labile blood pressure. He has been having shortness of breath on exertion for the past 2 weeks and has been following up with his cardiologist who he last saw on 01/06/15. They checked a BNP which was elevated and an ECG which has not come back yet. He is scheduled for a heart catheterization on 01/12/15 with the same doctor who put in his stents in 2013. He was also recently put on Imdur for his exertional shortness of breath, which was his reason for seeking treatment 2 weeks ago. He was told to call 911 if his BP dropped below 85 systolic. He has been checking his BP at home and this morning it was 76/40. He rechecked it 3 times, changing the battery in his machine, and it rose to 146/90. He said that other than feeling "flushed" and anxious about his blood pressure, he was having no symptoms at the time. He denies chest pain, palpitations, shortness of breath, diaphoresis, headache, blurry vision, numbness/tingling. He does state that his shoulder feels tight, but this is not new.   Since presenting, he has started feeling "pockets of gas" around his heart. This worries him because his only symptom with his first heart attack was indigestion and typically feels more a pressure than a pain with his heart attacks.   Past Medical History  Diagnosis Date  . Hypertension   . Hyperlipidemia   . Tobacco abuse   . Coronary artery disease     a. 1999: s/p PTCA;  b. 11/2012 Cath/PCI: LM mild plaque, LAD 60-32m, LCX 30p, 75m, OM1 68m, RCA 30-40ost, 40-73m, 80-23m/d (3.0x38, 3.0x32, 3.0x24 Promus Premier DESs),  70-80d, EF 40% inf HK;  c. 12/2012 Staged PCI of LAD w/ 3.0x12 Promus Premier DES.   Marland Kitchen Acute on chronic combined systolic and diastolic CHF (congestive heart failure) 09/18/2013  . Squamous cell carcinoma     Back  . Myocardial infarction     '99- MI,"angio[plasty", Stents 3'14  . H/O seasonal allergies   . Diabetes mellitus without complication     type 2 , no oral meds in 2 yrs.     . Elevated uric acid in blood   . Arthritis     osteoarthritis-knees, hands, wrist, fingers   Past Surgical History  Procedure Laterality Date  . Coronary angioplasty  1999  . Coronary angioplasty with stent placement  01/14/2013    DES to LAD    Dr Peter Martinique  . Hernia repair  2004 ?  Marland Kitchen Carpal tunnel release Left   . Ankle surgery  1960  . Total knee arthroplasty Right 06/08/2014    Procedure: RIGHT TOTAL KNEE ARTHROPLASTY;  Surgeon: Gearlean Alf, MD;  Location: [facility] ORS;  Service: Orthopedics;  Laterality: Right;  . Left heart catheterization with coronary angiogram N/A 12/27/2012    Procedure: LEFT HEART CATHETERIZATION WITH CORONARY ANGIOGRAM;  Surgeon: Jolaine Artist, MD;  Location: [facility] CATH LAB;  Service: Cardiovascular;  Laterality: N/A;  . Percutaneous coronary stent intervention (pci-s) N/A 12/30/2012    Procedure: PERCUTANEOUS CORONARY STENT INTERVENTION (PCI-S);  Surgeon: Peter M Martinique,  MD;  Location: [facility] CATH LAB;  Service: Cardiovascular;  Laterality: N/A;  . Percutaneous coronary stent intervention (pci-s) N/A 01/14/2013    Procedure: PERCUTANEOUS CORONARY STENT INTERVENTION (PCI-S);  Surgeon: Peter M Martinique, MD;  Location: [facility] CATH LAB;  Service: Cardiovascular;  Laterality: N/A;   Family History  Problem Relation Age of Onset  . Arrhythmia Sister     s/p PPM  . Alzheimer's disease Mother   . Hypertension Mother   . Diabetes Mother   . CVA Father    History  Substance Use Topics  . Smoking status: Former Smoker -- 15 years    Types: Cigars    Quit date: 09/02/2013  . Smokeless  tobacco: Never Used     Comment: rare ciagar on golf days-variable days 1-2 per month  . Alcohol Use: Yes     Comment: 6-pack of beer/weekend    Review of Systems All other systems negative except as documented in the HPI. All pertinent positives and negatives as reviewed in the HPI.    Allergies  Crestor; Lipitor; Pravastatin sodium; and Zocor  Home Medications   Prior to Admission medications   Medication Sig Start Date End Date Taking? Authorizing Provider  allopurinol (ZYLOPRIM) 300 MG tablet Take 300 mg by mouth daily.    Historical Provider, MD  amLODipine (NORVASC) 10 MG tablet Take 5 mg by mouth every morning.  12/31/12   Ripudeep Krystal Eaton, MD  aspirin EC 81 MG tablet Take 81 mg by mouth daily.    Historical Provider, MD  carvedilol (COREG) 6.25 MG tablet Take 1 tablet (6.25 mg total) by mouth 2 (two) times daily. 10/16/14   Peter M Martinique, MD  cetirizine (ZYRTEC) 10 MG tablet Take 10 mg by mouth daily as needed for allergies.     Historical Provider, MD  clopidogrel (PLAVIX) 75 MG tablet Take 1 tablet by mouth daily. 07/04/14   Historical Provider, MD  colchicine 0.6 MG tablet Take 0.6 mg by mouth daily as needed (Gout).    Historical Provider, MD  fluticasone (FLONASE) 50 MCG/ACT nasal spray Place 2 sprays into the nose daily as needed for allergies.  12/31/12   Ripudeep Krystal Eaton, MD  furosemide (LASIX) 40 MG tablet Take 40 mg by mouth 2 (two) times daily.     Historical Provider, MD  isosorbide mononitrate (IMDUR) 30 MG 24 hr tablet Take 1 tablet (30 mg total) by mouth daily. 01/06/15   Isaiah Serge, NP  lisinopril (PRINIVIL,ZESTRIL) 40 MG tablet Take 20 mg by mouth every morning.    Historical Provider, MD  Multiple Vitamin (MULTIVITAMIN) tablet Take 1 tablet by mouth daily.    Historical Provider, MD  nitroGLYCERIN (NITROSTAT) 0.4 MG SL tablet Place 1 tablet (0.4 mg total) under the tongue every 5 (five) minutes x 3 doses as needed for chest pain. 12/31/12   Ripudeep Krystal Eaton, MD   Probiotic Product (PROBIOTIC DAILY PO) Take 1 capsule by mouth daily.    Historical Provider, MD  tetrahydrozoline 0.05 % ophthalmic solution Place 1 drop into both eyes 2 (two) times daily as needed (Itchy eyes).    Historical Provider, MD   BP 133/98 mmHg  Pulse 74  Temp(Src) 99.1 F (37.3 C) (Oral)  Resp 21  Ht 5\' 8"  (1.727 m)  Wt 204 lb (92.534 kg)  BMI 31.03 kg/m2  SpO2 98% Physical Exam  Constitutional: He is oriented to person, place, and time. He appears well-developed and well-nourished.  HENT:  Head: Normocephalic and atraumatic.  Mouth/Throat: Oropharynx is clear and moist.  Eyes: EOM are normal. Pupils are equal, round, and reactive to light.  Neck: Normal range of motion. Neck supple. No JVD present.  Cardiovascular: Normal rate, regular rhythm, normal heart sounds and intact distal pulses.  Exam reveals no gallop and no friction rub.   No murmur heard. Pulmonary/Chest: Effort normal and breath sounds normal. No respiratory distress.  Abdominal: Soft. Bowel sounds are normal. He exhibits no distension. There is no tenderness.  Musculoskeletal: He exhibits no edema.  Lymphadenopathy:    He has no cervical adenopathy.  Neurological: He is alert and oriented to person, place, and time.  Skin: Skin is warm and dry.  Nursing note and vitals reviewed.   ED Course  Procedures (including critical care time) Labs Review Labs Reviewed  TROPONIN I  BASIC METABOLIC PANEL  CBC WITH DIFFERENTIAL/PLATELET    Imaging Review Dg Chest 2 View  01/09/2015   CLINICAL DATA:  Acute onset of sensation of indigestion at the chest. High blood pressure. Initial encounter.  EXAM: CHEST  2 VIEW  COMPARISON:  Chest radiograph from 12/28/2012  FINDINGS: The lungs are well-aerated. Mild vascular congestion is noted. There is no evidence of pleural effusion or pneumothorax.  The heart is normal in size; the mediastinal contour is within normal limits. No acute osseous abnormalities are seen.   IMPRESSION: Mild vascular congestion noted; lungs remain grossly clear.   Electronically Signed   By: Garald Balding M.D.   On: 01/09/2015 00:39     Patient be admitted to the hospital for further evaluation and assessment.  Spoke with the cardiologist on-call, who will admit the patient  Dalia Heading, PA-C 01/09/15 [place], MD 01/09/15 [phone]

## 2015-01-08 NOTE — Telephone Encounter (Signed)
Returned call to patient he stated he was calling for results of echo.Advised Dr.Jordan out of office.Will call back on Monday 01/11/15 after Dr.Jordan reviews.

## 2015-01-08 NOTE — Telephone Encounter (Signed)
Pt is calling in to get the results to his echo  Thanks

## 2015-01-08 NOTE — ED Notes (Signed)
Per EMS, pt scheduled for heart cath. Checked his BP at home and got varying values, mostly high for him. VSS at this time.

## 2015-01-09 ENCOUNTER — Encounter (HOSPITAL_COMMUNITY): Payer: Self-pay | Admitting: *Deleted

## 2015-01-09 DIAGNOSIS — I5042 Chronic combined systolic (congestive) and diastolic (congestive) heart failure: Secondary | ICD-10-CM

## 2015-01-09 DIAGNOSIS — Z789 Other specified health status: Secondary | ICD-10-CM | POA: Insufficient documentation

## 2015-01-09 DIAGNOSIS — I10 Essential (primary) hypertension: Secondary | ICD-10-CM

## 2015-01-09 DIAGNOSIS — R079 Chest pain, unspecified: Secondary | ICD-10-CM

## 2015-01-09 DIAGNOSIS — E785 Hyperlipidemia, unspecified: Secondary | ICD-10-CM

## 2015-01-09 LAB — CBC WITH DIFFERENTIAL/PLATELET
BASOS ABS: 0 10*3/uL (ref 0.0–0.1)
BASOS PCT: 1 % (ref 0–1)
EOS PCT: 2 % (ref 0–5)
Eosinophils Absolute: 0.1 10*3/uL (ref 0.0–0.7)
HEMATOCRIT: 39.4 % (ref 39.0–52.0)
HEMOGLOBIN: 13.9 g/dL (ref 13.0–17.0)
LYMPHS PCT: 28 % (ref 12–46)
Lymphs Abs: 1.7 10*3/uL (ref 0.7–4.0)
MCH: 31.6 pg (ref 26.0–34.0)
MCHC: 35.3 g/dL (ref 30.0–36.0)
MCV: 89.5 fL (ref 78.0–100.0)
MONOS PCT: 11 % (ref 3–12)
Monocytes Absolute: 0.7 10*3/uL (ref 0.1–1.0)
NEUTROS ABS: 3.6 10*3/uL (ref 1.7–7.7)
Neutrophils Relative %: 58 % (ref 43–77)
Platelets: 191 10*3/uL (ref 150–400)
RBC: 4.4 MIL/uL (ref 4.22–5.81)
RDW: 13.9 % (ref 11.5–15.5)
WBC: 6.2 10*3/uL (ref 4.0–10.5)

## 2015-01-09 LAB — BASIC METABOLIC PANEL
Anion gap: 10 (ref 5–15)
BUN: 17 mg/dL (ref 6–23)
CALCIUM: 9.1 mg/dL (ref 8.4–10.5)
CO2: 27 mmol/L (ref 19–32)
Chloride: 101 mmol/L (ref 96–112)
Creatinine, Ser: 0.99 mg/dL (ref 0.50–1.35)
GFR calc non Af Amer: 83 mL/min — ABNORMAL LOW (ref >90)
Glucose, Bld: 163 mg/dL — ABNORMAL HIGH (ref 70–99)
Potassium: 3.4 mmol/L — ABNORMAL LOW (ref 3.5–5.1)
Sodium: 138 mmol/L (ref 135–145)

## 2015-01-09 LAB — LIPID PANEL
Cholesterol: 188 mg/dL (ref 0–200)
HDL: 31 mg/dL — AB (ref >39)
LDL CALC: 117 mg/dL — AB (ref 0–99)
TRIGLYCERIDES: 199 mg/dL — AB (ref <150)
Total CHOL/HDL Ratio: 6.1 RATIO
VLDL: 40 mg/dL (ref 0–40)

## 2015-01-09 LAB — TROPONIN I
Troponin I: 0.03 ng/mL (ref <0.031)
Troponin I: <0.03 ng/mL (ref <0.031)

## 2015-01-09 LAB — COMPREHENSIVE METABOLIC PANEL
ALT: 21 U/L (ref 0–53)
AST: 21 U/L (ref 0–37)
Albumin: 4 g/dL (ref 3.5–5.2)
Alkaline Phosphatase: 79 U/L (ref 39–117)
Anion gap: 5 (ref 5–15)
BILIRUBIN TOTAL: 1.1 mg/dL (ref 0.3–1.2)
BUN: 17 mg/dL (ref 6–23)
CO2: 28 mmol/L (ref 19–32)
CREATININE: 1.01 mg/dL (ref 0.50–1.35)
Calcium: 9.1 mg/dL (ref 8.4–10.5)
Chloride: 104 mmol/L (ref 96–112)
GFR calc non Af Amer: 75 mL/min — ABNORMAL LOW (ref >90)
GFR, EST AFRICAN AMERICAN: 87 mL/min — AB (ref >90)
GLUCOSE: 129 mg/dL — AB (ref 70–99)
Potassium: 3.4 mmol/L — ABNORMAL LOW (ref 3.5–5.1)
SODIUM: 137 mmol/L (ref 135–145)
TOTAL PROTEIN: 7.9 g/dL (ref 6.0–8.3)

## 2015-01-09 LAB — PROTIME-INR
INR: 1.19 (ref 0.00–1.49)
PROTHROMBIN TIME: 15.2 s (ref 11.6–15.2)

## 2015-01-09 LAB — TSH: TSH: 2.17 u[IU]/mL (ref 0.350–4.500)

## 2015-01-09 LAB — APTT: aPTT: 35 seconds (ref 24–37)

## 2015-01-09 MED ORDER — FUROSEMIDE 10 MG/ML IJ SOLN
40.0000 mg | Freq: Once | INTRAMUSCULAR | Status: AC
  Administered 2015-01-09: 40 mg via INTRAVENOUS

## 2015-01-09 MED ORDER — ALLOPURINOL 300 MG PO TABS
300.0000 mg | ORAL_TABLET | Freq: Every day | ORAL | Status: DC
Start: 2015-01-09 — End: 2015-01-10
  Administered 2015-01-09 – 2015-01-10 (×2): 300 mg via ORAL
  Filled 2015-01-09 (×2): qty 1

## 2015-01-09 MED ORDER — COLCHICINE 0.6 MG PO TABS: 0.6000 mg | ORAL_TABLET | Freq: Every day | ORAL | Status: DC | PRN

## 2015-01-09 MED ORDER — HEPARIN SODIUM (PORCINE) 5000 UNIT/ML IJ SOLN
5000.0000 [IU] | Freq: Three times a day (TID) | INTRAMUSCULAR | Status: DC
  Administered 2015-01-09 – 2015-01-10 (×4): 5000 [IU] via SUBCUTANEOUS
  Filled 2015-01-09 (×4): qty 1

## 2015-01-09 MED ORDER — DM-GUAIFENESIN ER 30-600 MG PO TB12
1.0000 | ORAL_TABLET | Freq: Two times a day (BID) | ORAL | Status: DC
  Administered 2015-01-09 (×2): 1 via ORAL
  Filled 2015-01-09 (×2): qty 1

## 2015-01-09 MED ORDER — COLESEVELAM HCL 625 MG PO TABS
625.0000 mg | ORAL_TABLET | Freq: Two times a day (BID) | ORAL | Status: DC
  Administered 2015-01-09 – 2015-01-10 (×3): 625 mg via ORAL
  Filled 2015-01-09 (×6): qty 1

## 2015-01-09 MED ORDER — LISINOPRIL 20 MG PO TABS
20.0000 mg | ORAL_TABLET | Freq: Every morning | ORAL | Status: DC
  Administered 2015-01-09 – 2015-01-10 (×2): 20 mg via ORAL
  Filled 2015-01-09 (×2): qty 1

## 2015-01-09 MED ORDER — CARVEDILOL 6.25 MG PO TABS
6.2500 mg | ORAL_TABLET | Freq: Two times a day (BID) | ORAL | Status: DC
  Administered 2015-01-09 – 2015-01-10 (×3): 6.25 mg via ORAL
  Filled 2015-01-09 (×3): qty 1

## 2015-01-09 MED ORDER — NITROGLYCERIN 0.4 MG SL SUBL: 0.4000 mg | SUBLINGUAL_TABLET | SUBLINGUAL | Status: DC | PRN

## 2015-01-09 MED ORDER — CLOPIDOGREL BISULFATE 75 MG PO TABS
75.0000 mg | ORAL_TABLET | Freq: Every day | ORAL | Status: DC
  Administered 2015-01-09 – 2015-01-10 (×2): 75 mg via ORAL
  Filled 2015-01-09 (×2): qty 1

## 2015-01-09 MED ORDER — ACETAMINOPHEN 325 MG PO TABS
650.0000 mg | ORAL_TABLET | Freq: Four times a day (QID) | ORAL | Status: DC | PRN
  Administered 2015-01-09: 650 mg via ORAL
  Filled 2015-01-09: qty 2

## 2015-01-09 MED ORDER — AMLODIPINE BESYLATE 5 MG PO TABS
5.0000 mg | ORAL_TABLET | Freq: Every morning | ORAL | Status: DC
  Administered 2015-01-09 – 2015-01-10 (×2): 5 mg via ORAL
  Filled 2015-01-09 (×2): qty 1

## 2015-01-09 MED ORDER — FUROSEMIDE 40 MG PO TABS
40.0000 mg | ORAL_TABLET | Freq: Two times a day (BID) | ORAL | Status: DC
  Administered 2015-01-09 (×2): 40 mg via ORAL
  Filled 2015-01-09 (×3): qty 1

## 2015-01-09 MED ORDER — LORATADINE 10 MG PO TABS
10.0000 mg | ORAL_TABLET | Freq: Every day | ORAL | Status: DC
  Administered 2015-01-10: 10 mg via ORAL
  Filled 2015-01-09: qty 1

## 2015-01-09 MED ORDER — ISOSORBIDE MONONITRATE ER 30 MG PO TB24
30.0000 mg | ORAL_TABLET | Freq: Every day | ORAL | Status: DC
  Administered 2015-01-09 – 2015-01-10 (×2): 30 mg via ORAL
  Filled 2015-01-09 (×2): qty 1

## 2015-01-09 MED ORDER — ASPIRIN EC 81 MG PO TBEC
81.0000 mg | DELAYED_RELEASE_TABLET | Freq: Every day | ORAL | Status: DC
  Administered 2015-01-09 – 2015-01-10 (×2): 81 mg via ORAL
  Filled 2015-01-09 (×2): qty 1

## 2015-01-09 MED ORDER — FUROSEMIDE 10 MG/ML IJ SOLN
INTRAMUSCULAR | Status: AC
  Administered 2015-01-09: 40 mg via INTRAVENOUS
  Filled 2015-01-09: qty 4

## 2015-01-09 MED ORDER — POTASSIUM CHLORIDE ER 10 MEQ PO TBCR
40.0000 meq | EXTENDED_RELEASE_TABLET | Freq: Every day | ORAL | Status: DC
  Administered 2015-01-09 – 2015-01-10 (×2): 40 meq via ORAL
  Filled 2015-01-09 (×4): qty 4

## 2015-01-09 NOTE — Progress Notes (Signed)
UR completed 

## 2015-01-09 NOTE — H&P (Signed)
CARDIOLOGY INPATIENT HISTORY AND PHYSICAL EXAMINATION NOTE  Patient ID: Kenneth [name] MRN: [id number], DOB/AGE: 03-02-1946   Admit date: 01/08/2015   Primary Physician: Velna Hatchet, MD Primary Cardiologist: Peter Martinique MD  Reason for admission: Indigestion, anginal symptoms  HPI: This is a 69 y.o. male with known history of CAD and risk factors (hypertension, HLD), allergies, ischemic cardiomyopathy LVEF 40 -45%, OA, hemorrhoids who presented with indigestion. Patient was in his usual state of health when 2-3 days ago he woke up with SOB. He had severe nasal congestion at that time. He at that time did not come to the ED but took some antiallergy medications. [place] and he felt more fatigues and was having more DOE. These symptoms have been going on for the last 2-3 months and Dr. Martinique was planning to perform a cardiac catheterization on him around march 18. However he called in earlier and wanted to get the procedure done before that. For that purpose, his elective cardiac cath is now scheduled for upcoming Tuesday. However he was afraid of his symptoms today and came in earlier to the hospital.  He is describing his feeling of indigestion as feeling of burp stuck in the middle of his chest. The feeling is discomforting, 1-2/10 in intensity and is constantly present. His initial trop and EKG were negative. The symptoms started at 5pm in the evening. These are similar to what he had in 1999 and 2014 when he had his stents placed.   He had his first angioplasty 1999 for unstable anginal symptoms. He was admitted in February 2014 with unstable angina. Cardiac catheterization at that time demonstrated severe disease in the right coronary and in the LAD. The right coronary was felt to be the culprit and was treated with 3 Promus drug-eluting stents. FFR of the LAD was abnormal and 0.71. The patient returned in March for staged stenting of the LAD  He currently  lives alone although his sister lives nearby. He is more afraid because in the past the tests did not show that he had a blockage but when they performed a cardiac cath, he was found to have blockages. He continues to do well otherwise. Recently he has also noticed that his BP stays low since he was started on imdur and when his BP is low, he feels that he has low energy. Today he took NTG when he called 911 for coming to the hospital and he did not feel bad. His BP stayed in 140s at that time.    Problem List: Past Medical History  Diagnosis Date  . Hypertension   . Hyperlipidemia   . Tobacco abuse   . Coronary artery disease     a. 1999: s/p PTCA;  b. 11/2012 Cath/PCI: LM mild plaque, LAD 60-22m, LCX 30p, 75m, OM1 21m, RCA 30-40ost, 40-18m, 80-55m/d (3.0x38, 3.0x32, 3.0x24 Promus Premier DESs), 70-80d, EF 40% inf HK;  c. 12/2012 Staged PCI of LAD w/ 3.0x12 Promus Premier DES.   Marland Kitchen Acute on chronic combined systolic and diastolic CHF (congestive heart failure) 09/18/2013  . Squamous cell carcinoma     Back  . Myocardial infarction     '99- MI,"angio[plasty", Stents 3'14  . H/O seasonal allergies   . Diabetes mellitus without complication     type 2 , no oral meds in 2 yrs.     . Elevated uric acid in blood   . Arthritis     osteoarthritis-knees, hands, wrist, fingers    Past  Surgical History  Procedure Laterality Date  . Coronary angioplasty  1999  . Coronary angioplasty with stent placement  01/14/2013    DES to LAD    Dr Peter Martinique  . Hernia repair  2004 ?  Marland Kitchen Carpal tunnel release Left   . Ankle surgery  1960  . Total knee arthroplasty Right 06/08/2014    Procedure: RIGHT TOTAL KNEE ARTHROPLASTY;  Surgeon: Gearlean Alf, MD;  Location: [facility] ORS;  Service: Orthopedics;  Laterality: Right;  . Left heart catheterization with coronary angiogram N/A 12/27/2012    Procedure: LEFT HEART CATHETERIZATION WITH CORONARY ANGIOGRAM;  Surgeon: Jolaine Artist, MD;  Location: [facility] CATH LAB;   Service: Cardiovascular;  Laterality: N/A;  . Percutaneous coronary stent intervention (pci-s) N/A 12/30/2012    Procedure: PERCUTANEOUS CORONARY STENT INTERVENTION (PCI-S);  Surgeon: Peter M Martinique, MD;  Location: [facility] CATH LAB;  Service: Cardiovascular;  Laterality: N/A;  . Percutaneous coronary stent intervention (pci-s) N/A 01/14/2013    Procedure: PERCUTANEOUS CORONARY STENT INTERVENTION (PCI-S);  Surgeon: Peter M Martinique, MD;  Location: [facility] CATH LAB;  Service: Cardiovascular;  Laterality: N/A;     Allergies:  Allergies  Allergen Reactions  . Crestor [Rosuvastatin]     Muscle aches at Crestor 5 mg daily  . Lipitor [Atorvastatin]     Muscle aches at 20 mg daily   . Pravastatin Sodium     Muscle aches at 20 mg and 40 mg daily  . Zocor [Simvastatin-High Dose]     Muscle aches at 20 mg and 40 mg daily     Home Medications No current facility-administered medications for this encounter.   Current Outpatient Prescriptions  Medication Sig Dispense Refill  . allopurinol (ZYLOPRIM) 300 MG tablet Take 300 mg by mouth daily.    Marland Kitchen amLODipine (NORVASC) 10 MG tablet Take 5 mg by mouth every morning.     Marland Kitchen aspirin EC 81 MG tablet Take 81 mg by mouth daily.    . carvedilol (COREG) 6.25 MG tablet Take 1 tablet (6.25 mg total) by mouth 2 (two) times daily. 180 tablet 3  . cetirizine (ZYRTEC) 10 MG tablet Take 10 mg by mouth daily as needed for allergies.     Marland Kitchen clopidogrel (PLAVIX) 75 MG tablet Take 1 tablet by mouth daily.    . colchicine 0.6 MG tablet Take 0.6 mg by mouth daily as needed (Gout).    . fluticasone (FLONASE) 50 MCG/ACT nasal spray Place 2 sprays into the nose daily as needed for allergies.     . furosemide (LASIX) 40 MG tablet Take 40 mg by mouth 2 (two) times daily.     . isosorbide mononitrate (IMDUR) 30 MG 24 hr tablet Take 1 tablet (30 mg total) by mouth daily. 30 tablet 5  . lisinopril (PRINIVIL,ZESTRIL) 40 MG tablet Take 20 mg by mouth every morning.    . Multiple Vitamin  (MULTIVITAMIN) tablet Take 1 tablet by mouth daily.    . nitroGLYCERIN (NITROSTAT) 0.4 MG SL tablet Place 1 tablet (0.4 mg total) under the tongue every 5 (five) minutes x 3 doses as needed for chest pain. 60 tablet 3  . Probiotic Product (PROBIOTIC DAILY PO) Take 1 capsule by mouth daily.    Marland Kitchen tetrahydrozoline 0.05 % ophthalmic solution Place 1 drop into both eyes 2 (two) times daily as needed (Itchy eyes).       Family History  Problem Relation Age of Onset  . Arrhythmia Sister     s/p PPM  . Alzheimer's  disease Mother   . Hypertension Mother   . Diabetes Mother   . CVA Father      History   Social History  . Marital Status: Divorced    Spouse Name: N/A  . Number of Children: 3  . Years of Education: N/A   Occupational History  . Custodian    Social History Main Topics  . Smoking status: Former Smoker -- 15 years    Types: Cigars    Quit date: 09/02/2013  . Smokeless tobacco: Never Used     Comment: rare ciagar on golf days-variable days 1-2 per month  . Alcohol Use: Yes     Comment: 6-pack of beer/weekend  . Drug Use: No  . Sexual Activity: Yes   Other Topics Concern  . Not on file   Social History Narrative   Veteran. Receives care through the [facility].      Review of Systems: General: negative for chills, fever, night sweats or weight changes.  Cardiovascular: indigestion, dyspnea negative for dyspnea on exertion, edema, orthopnea, palpitations Dermatological: negative for rash Respiratory: negative for cough or wheezing Urologic: negative for hematuria Abdominal: negative for nausea, vomiting, diarrhea, bright red blood per rectum, melena, or hematemesis Neurologic: negative for visual changes, syncope, or dizziness  Physical Exam: Vitals: BP 127/98 mmHg  Pulse 67  Temp(Src) 99.1 F (37.3 C) (Oral)  Resp 18  Ht 5\' 8"  (1.727 m)  Wt 92.534 kg (204 lb)  BMI 31.03 kg/m2  SpO2 99% General: not in acute distress Neck: JVP flat, neck supple Heart:  regular rate and rhythm, S1, S2, no murmurs  Lungs: CTAB  GI: non tender, non distended, bowel sounds present Extremities: no edema Neuro: AAO x 3  Psych: normal affect, no anxiety   Labs:   Results for orders placed or performed during the hospital encounter of 01/08/15 (from the past 24 hour(s))  Troponin I     Status: None   Collection Time: 01/08/15  9:16 PM  Result Value Ref Range   Troponin I <0.03 <0.031 ng/mL  Basic metabolic panel     Status: Abnormal   Collection Time: 01/08/15  9:16 PM  Result Value Ref Range   Sodium 136 135 - 145 mmol/L   Potassium 3.8 3.5 - 5.1 mmol/L   Chloride 99 96 - 112 mmol/L   CO2 27 19 - 32 mmol/L   Glucose, Bld 102 (H) 70 - 99 mg/dL   BUN 20 6 - 23 mg/dL   Creatinine, Ser 1.02 0.50 - 1.35 mg/dL   Calcium 9.5 8.4 - 10.5 mg/dL   GFR calc non Af Amer 75 (L) >90 mL/min   GFR calc Af Amer 86 (L) >90 mL/min   Anion gap 10 5 - 15  CBC with Differential     Status: None   Collection Time: 01/08/15  9:16 PM  Result Value Ref Range   WBC 7.1 4.0 - 10.5 K/uL   RBC 4.47 4.22 - 5.81 MIL/uL   Hemoglobin 13.9 13.0 - 17.0 g/dL   HCT 40.0 39.0 - 52.0 %   MCV 89.5 78.0 - 100.0 fL   MCH 31.1 26.0 - 34.0 pg   MCHC 34.8 30.0 - 36.0 g/dL   RDW 13.8 11.5 - 15.5 %   Platelets 206 150 - 400 K/uL   Neutrophils Relative % 66 43 - 77 %   Neutro Abs 4.7 1.7 - 7.7 K/uL   Lymphocytes Relative 23 12 - 46 %   Lymphs Abs 1.7 0.7 -  4.0 K/uL   Monocytes Relative 9 3 - 12 %   Monocytes Absolute 0.6 0.1 - 1.0 K/uL   Eosinophils Relative 2 0 - 5 %   Eosinophils Absolute 0.1 0.0 - 0.7 K/uL   Basophils Relative 0 0 - 1 %   Basophils Absolute 0.0 0.0 - 0.1 K/uL     Radiology/Studies: Dg Chest 2 View  01/09/2015   CLINICAL DATA:  Acute onset of sensation of indigestion at the chest. High blood pressure. Initial encounter.  EXAM: CHEST  2 VIEW  COMPARISON:  Chest radiograph from 12/28/2012  FINDINGS: The lungs are well-aerated. Mild vascular congestion is noted. There  is no evidence of pleural effusion or pneumothorax.  The heart is normal in size; the mediastinal contour is within normal limits. No acute osseous abnormalities are seen.  IMPRESSION: Mild vascular congestion noted; lungs remain grossly clear.   Electronically Signed   By: Garald Balding M.D.   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   Cardiac cath: He had his first angioplasty 1999 for unstable anginal symptoms. He was admitted in February 2014 with unstable angina. Cardiac catheterization at that time demonstrated severe disease in the right coronary and in the LAD. The right coronary was felt to be the culprit and was treated with 3 Promus drug-eluting stents. FFR of the LAD was abnormal and 0.71. The patient returned in March for staged stenting of the LAD  Medical decision making:  Discussed care with the patient Discussed care with the physician on the phone Reviewed labs and imaging personally Reviewed prior records  ASSESSMENT AND PLAN:  This is a 69 y.o. male with known history of CAD and risk factors (hypertension, HLD, systolic ischemic cardiomyopathy) who presented with indigestion today.   Principal Problem:   Chest pain with high risk of acute coronary syndrome Active Problems:   Hypertension   Hyperlipidemia   CAD S/P percutaneous coronary angioplasty   Chronic combined systolic and diastolic CHF (congestive heart failure)  Stable angina CCS class IV/chest pain syndrome with high risk of ACS increasing episodes of indigestion, will plan to have elective cardiac cath, and is currently on 3 antianginal meds (imdur, coreg and amlodipine) as well as allopurinol and colchicine - recommend IV heparin if  needed - cycle troponin - NPO post midnight - CBC, CMP, INR/PTT - TSH, HbA1c, lipid panel  Chronic combined systolic and diastolic congestive heart failure, NYHA class III Stage C, INTERMACS 4 On coreg 6.25, imdur 30, lisinopril 40, aspirin 81 Mildly volume overloaded Will give one dose of IV lasix  Hypertension, essential Continue home blood pressure meds Goal <140/90  Hyperlipidemia Lipid panel, statin intolerant Will consider starting on cholestyramine as some data suggest improvement in survival   Signed, Flossie Dibble, MD MS 01/09/2015, 1:41 AM

## 2015-01-09 NOTE — Progress Notes (Signed)
Patient ID: Kenneth [name], male   DOB: February 10, 1946, 69 y.o.   MRN: [id number]    Subjective:  Denies SSCP, palpitations or Dyspnea Wants Dr Martinique to do cath Scheduled as outpatient on Tuesday  Objective:  Filed Vitals:   01/09/15 0130 01/09/15 0200 01/09/15 0238 01/09/15 0500  BP: 119/63 119/75 137/81 127/65  Pulse: 61  66 64  Temp:   98.2 F (36.8 C) 98.3 F (36.8 C)  TempSrc:   Oral Oral  Resp:  20    Height:    5\' 8"  (1.727 m)  Weight:    92.534 kg (204 lb)  SpO2: 97% 98% 99% 98%    Intake/Output from previous day:  Intake/Output Summary (Last 24 hours) at 01/09/15 0737 Last data filed at 01/09/15 0600  Gross per 24 hour  Intake      0 ml  Output   1175 ml  Net  -1175 ml    Physical Exam: Affect appropriate Healthy:  appears stated age HEENT: normal Neck supple with no adenopathy JVP normal no bruits no thyromegaly Lungs clear with no wheezing and good diaphragmatic motion Heart:  S1/S2 MR  murmur, no rub, gallop or click PMI normal Abdomen: benighn, BS positve, no tenderness, no AAA no bruit.  No HSM or HJR Distal pulses intact with no bruits No edema Neuro non-focal Skin warm and dry No muscular weakness   Lab Results: Basic Metabolic Panel:  Recent Labs  01/08/15 2116 01/09/15 0352  NA 136 137  K 3.8 3.4*  CL 99 104  CO2 27 28  GLUCOSE 102* 129*  BUN 20 17  CREATININE 1.02 1.01  CALCIUM 9.5 9.1   Liver Function Tests:  Recent Labs  01/06/15 1024 01/09/15 0352  AST 20 21  ALT 19 21  ALKPHOS 71 79  BILITOT 0.9 1.1  PROT 7.8 7.9  ALBUMIN 4.3 4.0   CBC:  Recent Labs  01/08/15 2116 01/09/15 0352  WBC 7.1 6.2  NEUTROABS 4.7 3.6  HGB 13.9 13.9  HCT 40.0 39.4  MCV 89.5 89.5  PLT 206 191   Cardiac Enzymes:  Recent Labs  01/08/15 2116 01/09/15 0352  TROPONINI <0.03 0.03    Imaging: Dg Chest 2 View  01/09/2015   CLINICAL DATA:  Acute onset of sensation of indigestion at the chest. High blood pressure. Initial encounter.   EXAM: CHEST  2 VIEW  COMPARISON:  Chest radiograph from 12/28/2012  FINDINGS: The lungs are well-aerated. Mild vascular congestion is noted. There is no evidence of pleural effusion or pneumothorax.  The heart is normal in size; the mediastinal contour is within normal limits. No acute osseous abnormalities are seen.  IMPRESSION: Mild vascular congestion noted; lungs remain grossly clear.   Electronically Signed   By: Garald Balding M.D.   On: 01/09/2015 00:39    Cardiac Studies:  ECG:  NSR normal ST segments   Telemetry:  NSR no arrhythmia 01/09/2015   Echo:   3/10  EF 45%  Moderate to severe MR  Medications:   . allopurinol  300 mg Oral Daily  . amLODipine  5 mg Oral q morning - 10a  . aspirin EC  81 mg Oral Daily  . carvedilol  6.25 mg Oral BID WC  . clopidogrel  75 mg Oral Daily  . colesevelam  625 mg Oral BID WC  . furosemide  40 mg Oral BID  . heparin  5,000 Units Subcutaneous 3 times per day  . isosorbide mononitrate  30 mg Oral Daily  .  lisinopril  20 mg Oral q morning - 10a  . loratadine  10 mg Oral Daily       Assessment/Plan:  Chest Pain:  Atypical  R/O normla ECG  Continue beta blocker asa and plavix  Observe today if ok d/c am for cath with Dr Martinique Tuesday If more symptoms iv heparin and someone else will have to cath Monday Previous stenting of RCA and LAD most recent 2014 MR:  Consider right and left cath for assessment of ischemic MR Chol:  Continue colesevelam HTN:  On ACE    Jenkins Rouge 01/09/2015, 7:37 AM

## 2015-01-09 NOTE — Care Management Note (Signed)
Date Additional Medicare IM given:  01/09/15  Additional Medicare IM given by:  Kambria Grima, RN, BSN, CCM   

## 2015-01-09 NOTE — Progress Notes (Signed)
BP lower this evening than it has been (98/59).  Dr. Percival Spanish notified.  Will go ahead and give evening po meds (coreg 6.25mg  and lasix 40mg ).  Right before giving these meds, BP re checked and it came up to 112/69.  Pt was very concerned and stated he felt flushed and had a mild headache.  Advised pt we would re check VS at bedtime and again if needed.  He states that isosorbide was started Wednesday and his BP went down to 76/46 at one time (Friday evening) when he took it at home.  Will continue to monitor.

## 2015-01-10 MED ORDER — FUROSEMIDE 40 MG PO TABS: 40.0000 mg | ORAL_TABLET | Freq: Two times a day (BID) | ORAL | Status: DC

## 2015-01-10 MED ORDER — COLESEVELAM HCL 625 MG PO TABS: 625.0000 mg | ORAL_TABLET | Freq: Two times a day (BID) | ORAL | Status: DC

## 2015-01-10 MED ORDER — POTASSIUM CHLORIDE ER 20 MEQ PO TBCR: 20.0000 meq | EXTENDED_RELEASE_TABLET | Freq: Every day | ORAL | Status: DC | PRN

## 2015-01-10 MED ORDER — NITROGLYCERIN 0.4 MG SL SUBL: 0.4000 mg | SUBLINGUAL_TABLET | SUBLINGUAL | Status: DC | PRN

## 2015-01-10 NOTE — Discharge Summary (Signed)
Physician Discharge Summary    Cardiologist:  [name]  Patient ID: Kenneth [name] MRN: [id number] DOB/AGE: [birth year] 69 y.o.  Admit date: 01/08/2015 Discharge date: 01/10/2015  Admission Diagnoses:    Chest Pain  Discharge Diagnoses:  Principal Problem:   Chest pain with high risk of acute coronary syndrome Active Problems:   Hypertension   Hyperlipidemia   CAD S/P percutaneous coronary angioplasty   Chronic combined systolic and diastolic CHF (congestive heart failure)   Statin intolerance   Discharged Condition: stable  Hospital Course:  This is a 69 y.o. male with known history of CAD and risk factors (hypertension, HLD), allergies, ischemic cardiomyopathy LVEF 40 -45%, OA, hemorrhoids who presented with indigestion. Patient was in his usual state of health when 2-3 days ago he woke up with SOB. He had severe nasal congestion at that time. He at that time did not come to the ED but took some antiallergy medications. [place] and he felt more fatigues and was having more DOE. These symptoms have been going on for the last 2-3 months and Dr. Martinique was planning to perform a cardiac catheterization on him around march 18. However he called in earlier and wanted to get the procedure done before that. For that purpose, his elective cardiac cath is now scheduled for upcoming Tuesday. However he was afraid of his symptoms today and came in earlier to the hospital.  He is describing his feeling of indigestion as feeling of burp stuck in the middle of his chest. The feeling is discomforting, 1-2/10 in intensity and is constantly present. His initial trop and EKG were negative. The symptoms started at 5pm in the evening. These are similar to what he had in 1999 and 2014 when he had his stents placed.   He had his first angioplasty 1999 for unstable anginal symptoms. He was admitted in February 2014 with unstable angina. Cardiac catheterization at that time demonstrated  severe disease in the right coronary and in the LAD. The right coronary was felt to be the culprit and was treated with 3 Promus drug-eluting stents. FFR of the LAD was abnormal and 0.71. The patient returned in March for staged stenting of the LAD  He currently lives alone although his sister lives nearby. He is more afraid because in the past the tests did not show that he had a blockage but when they performed a cardiac cath, he was found to have blockages. He continues to do well otherwise. Recently he has also noticed that his BP stays low since he was started on imdur and when his BP is low, he feels that he has low energy. Today he took NTG when he called 911 for coming to the hospital and he did not feel bad. His BP stayed in 140s at that time.   The patient was admitted for observation.  He ruled out for MI.  No acute EKG changes.  CXR revealed mild vascular congestion.  He was given 40mg  IV lasix day one and two doses of 40mg  PO.  Net fluids -1.0L.  It was held the morning of 3/13 because he was having periodic hypotension.  Potassium replaced.  Will DC on PRN lasix with instructions for low sodium diet and daily weight monitoring.  Continue other home meds.  He will return on Tuesday for his scheduled R/L heart caths.  The patient was seen by Dr. Lovena [name] who felt he was stable for DC home.     Consults: None  Significant Diagnostic Studies:   CHEST 2 VIEW  COMPARISON: Chest radiograph from 12/28/2012  FINDINGS: The lungs are well-aerated. Mild vascular congestion is noted. There is no evidence of pleural effusion or pneumothorax.  The heart is normal in size; the mediastinal contour is within normal limits. No acute osseous abnormalities are seen.  IMPRESSION: Mild vascular congestion noted; lungs remain grossly clear.  Treatments: See above  Discharge Exam: Blood pressure 119/67, pulse 61, temperature 98.2 F (36.8 C), temperature source Oral, resp. rate 20, height 5'  8" (1.727 m), weight 210 lb 4.8 oz (95.391 kg), SpO2 97 %.   Disposition: 06-Home-Health Care Svc      Discharge Instructions    Diet - low sodium heart healthy    Complete by:  As directed      Discharge instructions    Complete by:  As directed   Monitor your weight every morning.  If you gain 3 pounds in 24 hours, or 5 pounds in a week take 40mg  of lasix daily until weight back to baseline.  If it does not go back down, call the office for instructions.     Increase activity slowly    Complete by:  As directed             Medication List    TAKE these medications        allopurinol 300 MG tablet  Commonly known as:  ZYLOPRIM  Take 300 mg by mouth daily.     amLODipine 10 MG tablet  Commonly known as:  NORVASC  Take 5 mg by mouth every morning.     aspirin EC 81 MG tablet  Take 81 mg by mouth daily.     carvedilol 6.25 MG tablet  Commonly known as:  COREG  Take 1 tablet (6.25 mg total) by mouth 2 (two) times daily.     cetirizine 10 MG tablet  Commonly known as:  ZYRTEC  Take 10 mg by mouth daily as needed for allergies.     clopidogrel 75 MG tablet  Commonly known as:  PLAVIX  Take 1 tablet by mouth daily.     colchicine 0.6 MG tablet  Take 0.6 mg by mouth daily as needed (Gout).     colesevelam 625 MG tablet  Commonly known as:  WELCHOL  Take 1 tablet (625 mg total) by mouth 2 (two) times daily with a meal.     fluticasone 50 MCG/ACT nasal spray  Commonly known as:  FLONASE  Place 2 sprays into the nose daily as needed for allergies.     furosemide 40 MG tablet  Commonly known as:  LASIX  Take 1 tablet (40 mg total) by mouth 2 (two) times daily.     isosorbide mononitrate 30 MG 24 hr tablet  Commonly known as:  IMDUR  Take 1 tablet (30 mg total) by mouth daily.     lisinopril 40 MG tablet  Commonly known as:  PRINIVIL,ZESTRIL  Take 20 mg by mouth every morning.     multivitamin tablet  Take 1 tablet by mouth daily.     nitroGLYCERIN 0.4 MG  SL tablet  Commonly known as:  NITROSTAT  Place 1 tablet (0.4 mg total) under the tongue every 5 (five) minutes x 3 doses as needed for chest pain.     Potassium Chloride ER 20 MEQ Tbcr  Take 20 mEq by mouth daily as needed.     PROBIOTIC DAILY PO  Take 1 capsule by mouth daily.  tetrahydrozoline 0.05 % ophthalmic solution  Place 1 drop into both eyes 2 (two) times daily as needed (Itchy eyes).       Follow-up Information    Follow up with Kenneth Martinique, MD On 01/12/2015.   Specialty:  Cardiology   Why:  Right and left heart caths as scheduled   Contact information:   [address] [place] [address] [phone]     Greater than 30 minutes was spent completing the patient's discharge.    SignedTarri Fuller, PA-C 01/10/2015, 11:28 AM  Cardiology Attending  Patient seen and examined. Agree with above. Ready for dc home.  Mikle Bosworth.D.

## 2015-01-10 NOTE — Progress Notes (Signed)
Utilization review completed.  P.J. Anaia Frith,RN,BSN Case Manager 

## 2015-01-10 NOTE — Progress Notes (Signed)
Subjective: No complaints.  Objective: Vital signs in last 24 hours: Temp:  [97.9 F (36.6 C)-98.2 F (36.8 C)] 98.2 F (36.8 C) (03/13 0605) Pulse Rate:  [61-79] 61 (03/13 0605) Resp:  [18-20] 20 (03/13 0605) BP: (97-138)/(59-92) 119/67 mmHg (03/13 0605) SpO2:  [97 %-99 %] 97 % (03/13 0605) Weight:  [210 lb 4.8 oz (95.391 kg)] 210 lb 4.8 oz (95.391 kg) (03/13 0605) Last BM Date: 01/08/15  Intake/Output from previous day: 03/12 0701 - 03/13 0700 In: 1186 [P.O.:1186] Out: 950 [Urine:950] Intake/Output this shift:    Medications Current Facility-Administered Medications  Medication Dose Route Frequency Provider Last Rate Last Dose  . acetaminophen (TYLENOL) tablet 650 mg  650 mg Oral Q6H PRN Minus Breeding, MD   650 mg at 01/09/15 1940  . allopurinol (ZYLOPRIM) tablet 300 mg  300 mg Oral Daily Flossie Dibble, MD   300 mg at 01/09/15 1019  . amLODipine (NORVASC) tablet 5 mg  5 mg Oral q morning - 10a Flossie Dibble, MD   5 mg at 01/09/15 1019  . aspirin EC tablet 81 mg  81 mg Oral Daily Flossie Dibble, MD   81 mg at 01/09/15 1019  . carvedilol (COREG) tablet 6.25 mg  6.25 mg Oral BID WC Flossie Dibble, MD   6.25 mg at 01/09/15 1831  . clopidogrel (PLAVIX) tablet 75 mg  75 mg Oral Daily Flossie Dibble, MD   75 mg at 01/09/15 1019  . colchicine tablet 0.6 mg  0.6 mg Oral Daily PRN Flossie Dibble, MD      . colesevelam [facility]) tablet 625 mg  625 mg Oral BID WC Flossie Dibble, MD   625 mg at 01/09/15 1832  . dextromethorphan-guaiFENesin (MUCINEX DM) 30-600 MG per 12 hr tablet 1 tablet  1 tablet Oral BID Erlene Quan, PA-C   1 tablet at 01/09/15 2145  . furosemide (LASIX) tablet 40 mg  40 mg Oral BID Flossie Dibble, MD   40 mg at 01/09/15 1832  . heparin injection 5,000 Units  5,000 Units Subcutaneous 3 times per day Flossie Dibble, MD   5,000 Units at 01/10/15 0650  . isosorbide mononitrate (IMDUR) 24 hr tablet 30 mg  30 mg Oral Daily Flossie Dibble, MD   30 mg at  01/09/15 1019  . lisinopril (PRINIVIL,ZESTRIL) tablet 20 mg  20 mg Oral q morning - 10a Flossie Dibble, MD   20 mg at 01/09/15 1019  . loratadine (CLARITIN) tablet 10 mg  10 mg Oral Daily Flossie Dibble, MD   10 mg at 01/09/15 2200  . nitroGLYCERIN (NITROSTAT) SL tablet 0.4 mg  0.4 mg Sublingual Q5 Min x 3 PRN Flossie Dibble, MD      . potassium chloride (K-DUR) CR tablet 40 mEq  40 mEq Oral Daily Josue Hector, MD   40 mEq at 01/09/15 0850    PE: General appearance: alert, cooperative and no distress Lungs: clear to auscultation bilaterally Heart: regular rate and rhythm, S1, S2 normal, no murmur, click, rub or gallop Extremities: No LEE Pulses: 2+ and symmetric Skin: Warm and dry Neurologic: Grossly normal  Lab Results:   Recent Labs  01/08/15 2116 01/09/15 0352  WBC 7.1 6.2  HGB 13.9 13.9  HCT 40.0 39.4  PLT 206 191   BMET  Recent Labs  01/08/15 2116 01/09/15 0352 01/09/15 0910  NA 136 137 138  K 3.8 3.4* 3.4*  CL 99 104  101  CO2 27 28 27   GLUCOSE 102* 129* 163*  BUN 20 17 17   CREATININE 1.02 1.01 0.99  CALCIUM 9.5 9.1 9.1   PT/INR  Recent Labs  01/09/15 0352  LABPROT 15.2  INR 1.19   Cholesterol  Recent Labs  01/09/15 0910  CHOL 188   Cardiac Panel (last 3 results)  Recent Labs  01/08/15 2116 01/09/15 0352 01/09/15 0910  TROPONINI <0.03 0.03 <0.03    Assessment/Plan 69 y.o. male with known history of CAD and risk factors (hypertension, HLD), allergies, ischemic cardiomyopathy LVEF 40 -45%, OA, hemorrhoids who presented with indigestion. Patient was in his usual state of health when 2-3 days ago he woke up with SOB.    Chest pain with high risk of acute coronary syndrome  He has ruled out for MI.  R/L heart caths on Tuesday.     Hypertension He has had some mild hypotensive but looks good right now.  Me be related to the lasix; one dose 40mg  IV then two doses of 40 PO in ~24 hours.     Hyperlipidemia   CAD S/P percutaneous coronary  angioplasty   Chronic combined systolic and diastolic CHF (congestive heart failure)  Net fluids: +0.2L/-1.0L.  Holding lasix this morning due to hypotension recently.  He appears euvolemic.   Statin intolerance   Hypotension  Holding lasix this morning   Hypokalemia  Replace this morning.     Ambulate this morning.  If no issues he could be discharged with caths on Tuesday.  Tarri Fuller PA-C 01/10/2015 8:46 AM  Cardiology Attending  Patient seen and examined. Agree with Mr. Amador Cunas note. The patient is stable for discharge and followup for outpatient cath. I have warned him not to exert himself, take lasix as needed and slntg and return on Tuesday for catheterization. I spent 30 minutes answering his many questions.   Mikle Bosworth.D.

## 2015-01-11 ENCOUNTER — Telehealth: Payer: Self-pay | Admitting: Cardiology

## 2015-01-11 NOTE — Telephone Encounter (Signed)
Returned call to patient Dr.Jordan advised ok to take Welchol does not have to hold for cardiac cath.Post hospital appointment scheduled with Cecilie Kicks NP 01/25/15 at 11:30 am.

## 2015-01-11 NOTE — Telephone Encounter (Signed)
Pt is going to have Cath tomorrow. He want to know should he take his Welchol tonight and tomorrow?

## 2015-01-11 NOTE — Telephone Encounter (Signed)
Returned call to patient he stated he wanted to know if he needs to hold Welchol before cardiac cath tomorrow.Stated he read welchol lowers blood sugar as well as cholesterol.Advised will ask Dr.Jordan and call you back.

## 2015-01-12 ENCOUNTER — Encounter (HOSPITAL_COMMUNITY)
Admission: RE | Disposition: A | Discharge status: Home / Self Care | Payer: Self-pay | Source: Ambulatory Visit | Attending: Cardiology

## 2015-01-12 ENCOUNTER — Encounter (HOSPITAL_COMMUNITY): Payer: Self-pay | Admitting: Cardiology

## 2015-01-12 ENCOUNTER — Ambulatory Visit (HOSPITAL_COMMUNITY)
Admission: RE | Admit: 2015-01-12 | Discharge: 2015-01-12 | Disposition: A | Discharge status: Home / Self Care | Payer: Commercial Managed Care - HMO | Source: Ambulatory Visit | Attending: Cardiology | Admitting: Cardiology

## 2015-01-12 DIAGNOSIS — Z9861 Coronary angioplasty status: Secondary | ICD-10-CM

## 2015-01-12 DIAGNOSIS — I5042 Chronic combined systolic (congestive) and diastolic (congestive) heart failure: Secondary | ICD-10-CM | POA: Diagnosis present

## 2015-01-12 DIAGNOSIS — I252 Old myocardial infarction: Secondary | ICD-10-CM | POA: Insufficient documentation

## 2015-01-12 DIAGNOSIS — I340 Nonrheumatic mitral (valve) insufficiency: Secondary | ICD-10-CM | POA: Insufficient documentation

## 2015-01-12 DIAGNOSIS — I10 Essential (primary) hypertension: Secondary | ICD-10-CM | POA: Diagnosis not present

## 2015-01-12 DIAGNOSIS — I2510 Atherosclerotic heart disease of native coronary artery without angina pectoris: Secondary | ICD-10-CM

## 2015-01-12 DIAGNOSIS — E785 Hyperlipidemia, unspecified: Secondary | ICD-10-CM | POA: Diagnosis not present

## 2015-01-12 DIAGNOSIS — R0609 Other forms of dyspnea: Secondary | ICD-10-CM

## 2015-01-12 DIAGNOSIS — Z7982 Long term (current) use of aspirin: Secondary | ICD-10-CM | POA: Diagnosis not present

## 2015-01-12 DIAGNOSIS — Z79899 Other long term (current) drug therapy: Secondary | ICD-10-CM | POA: Diagnosis not present

## 2015-01-12 DIAGNOSIS — R079 Chest pain, unspecified: Secondary | ICD-10-CM | POA: Diagnosis present

## 2015-01-12 HISTORY — PX: LEFT AND RIGHT HEART CATHETERIZATION WITH CORONARY ANGIOGRAM: SHX5449

## 2015-01-12 LAB — GLUCOSE, CAPILLARY
GLUCOSE-CAPILLARY: 110 mg/dL — AB (ref 70–99)
Glucose-Capillary: 122 mg/dL — ABNORMAL HIGH (ref 70–99)

## 2015-01-12 LAB — CK TOTAL AND CKMB (NOT AT ARMC)
CK, MB: 2 ng/mL (ref 0.3–4.0)
Relative Index: INVALID (ref 0.0–2.5)
Total CK: 54 U/L (ref 7–232)

## 2015-01-12 SURGERY — LEFT AND RIGHT HEART CATHETERIZATION WITH CORONARY ANGIOGRAM
Anesthesia: LOCAL

## 2015-01-12 MED ORDER — MIDAZOLAM HCL 2 MG/2ML IJ SOLN
INTRAMUSCULAR | Status: AC
  Filled 2015-01-12: qty 2

## 2015-01-12 MED ORDER — SODIUM CHLORIDE 0.9 % IJ SOLN: 3.0000 mL | Freq: Two times a day (BID) | INTRAMUSCULAR | Status: DC

## 2015-01-12 MED ORDER — SODIUM CHLORIDE 0.9 % IV SOLN: 1.0000 mL/kg/h | INTRAVENOUS | Status: DC

## 2015-01-12 MED ORDER — FENTANYL CITRATE 0.05 MG/ML IJ SOLN
INTRAMUSCULAR | Status: AC
  Filled 2015-01-12: qty 2

## 2015-01-12 MED ORDER — SODIUM CHLORIDE 0.9 % IV SOLN
INTRAVENOUS | Status: DC
  Administered 2015-01-12: 09:00:00 via INTRAVENOUS

## 2015-01-12 MED ORDER — HEPARIN (PORCINE) IN NACL 2-0.9 UNIT/ML-% IJ SOLN
INTRAMUSCULAR | Status: AC
  Filled 2015-01-12: qty 1500

## 2015-01-12 MED ORDER — SODIUM CHLORIDE 0.9 % IV SOLN: 250.0000 mL | INTRAVENOUS | Status: DC | PRN

## 2015-01-12 MED ORDER — LIDOCAINE HCL (PF) 1 % IJ SOLN
INTRAMUSCULAR | Status: AC
  Filled 2015-01-12: qty 30

## 2015-01-12 MED ORDER — SODIUM CHLORIDE 0.9 % IJ SOLN: 3.0000 mL | INTRAMUSCULAR | Status: DC | PRN

## 2015-01-12 MED ORDER — FUROSEMIDE 40 MG PO TABS: 40.0000 mg | ORAL_TABLET | Freq: Every day | ORAL | Status: DC

## 2015-01-12 NOTE — CV Procedure (Signed)
    Cardiac Catheterization Procedure Note  Name: Kenneth [name] MRN: [id number] DOB: 1946-03-06  Procedure: Right Heart Cath, Left Heart Cath, Selective Coronary Angiography, LV angiography  Indication: 69 yo WM with remote history of MI in 1999. S/p stenting of the mid LAD and extensive stenting of the RCA in February 2015. He presents now with symptoms of chest tightness and dyspnea. Echo shows EF 40-45% with moderate to severe MR.    Procedural Details: The right groin was prepped, draped, and anesthetized with 1% lidocaine. Using the modified Seldinger technique a 5 Fr  sheath was placed in the right femoral artery and a 7 French sheath was placed in the right femoral vein. A Swan-Ganz catheter was used for the right heart catheterization. Standard protocol was followed for recording of right heart pressures and sampling of oxygen saturations. Fick cardiac output was calculated. Standard Judkins catheters were used for selective coronary angiography and left ventriculography. There were no immediate procedural complications. The patient was transferred to the post catheterization recovery area for further monitoring.  Procedural Findings: Hemodynamics RA 8/5 mean 4 mm Hg RV 31/2 mm Hg PA 25/11 mm Hg PCWP 13/11 mean 9 mm Hg LV 113/9 mm Hg AO 115/61 mean 81 mm Hg  Oxygen saturations: PA 67% AO 94%  Cardiac Output (Fick) 5.37 L/min  Cardiac Index (Fick) 2.6 L/min/meter squared   Coronary angiography: Coronary dominance: right  Left mainstem: Normal  Left anterior descending (LAD): There is moderate calcification. In the proximal vessel there is diffuse 30% narrowing. There is a stent in the mid vessel that is widely patent. In the mid to distal vessel there is a focal 40% stenosis. The first diagonal has 40% disease.   Left circumflex (LCx): The LCx gives off 2 OM branches proximally then terminates with a large OM. There is 30% disease in a small first OM. There is 30% diffuse  disease in the mid to distal LCx.  Right coronary artery (RCA): The RCA is a large dominant vessel. The stents in the proximal, mid, and distal vessel are widely patent.   Left ventriculography: Left ventricular systolic function is abnormal, there is severe hypokinesis of the inferior wall. LVEF is estimated at 35-40%, there is mild to moderate mitral regurgitation   Final Conclusions:   1. Nonobstructive CAD 2. Moderate LV dysfunction with inferior wall motion abnormality. 3. Mild to moderate MR 4. Normal right heart pressures.  Recommendations: Continue medical management. LV filling pressures are low suggesting MR is not severe. Will reduce lasix to daily. Stop amlodipine. Continue Coreg, ACEi, and nitrates. May consider aldactone as outpatient.    [name], [place] 01/12/2015, 12:01 PM

## 2015-01-12 NOTE — H&P (View-Only) (Signed)
Subjective: No complaints.  Objective: Vital signs in last 24 hours: Temp:  [97.9 F (36.6 C)-98.2 F (36.8 C)] 98.2 F (36.8 C) (03/13 0605) Pulse Rate:  [61-79] 61 (03/13 0605) Resp:  [18-20] 20 (03/13 0605) BP: (97-138)/(59-92) 119/67 mmHg (03/13 0605) SpO2:  [97 %-99 %] 97 % (03/13 0605) Weight:  [210 lb 4.8 oz (95.391 kg)] 210 lb 4.8 oz (95.391 kg) (03/13 0605) Last BM Date: 01/08/15  Intake/Output from previous day: 03/12 0701 - 03/13 0700 In: 1186 [P.O.:1186] Out: 950 [Urine:950] Intake/Output this shift:    Medications Current Facility-Administered Medications  Medication Dose Route Frequency Provider Last Rate Last Dose  . acetaminophen (TYLENOL) tablet 650 mg  650 mg Oral Q6H PRN Minus Breeding, MD   650 mg at 01/09/15 1940  . allopurinol (ZYLOPRIM) tablet 300 mg  300 mg Oral Daily Flossie Dibble, MD   300 mg at 01/09/15 1019  . amLODipine (NORVASC) tablet 5 mg  5 mg Oral q morning - 10a Flossie Dibble, MD   5 mg at 01/09/15 1019  . aspirin EC tablet 81 mg  81 mg Oral Daily Flossie Dibble, MD   81 mg at 01/09/15 1019  . carvedilol (COREG) tablet 6.25 mg  6.25 mg Oral BID WC Flossie Dibble, MD   6.25 mg at 01/09/15 1831  . clopidogrel (PLAVIX) tablet 75 mg  75 mg Oral Daily Flossie Dibble, MD   75 mg at 01/09/15 1019  . colchicine tablet 0.6 mg  0.6 mg Oral Daily PRN Flossie Dibble, MD      . colesevelam [facility]) tablet 625 mg  625 mg Oral BID WC Flossie Dibble, MD   625 mg at 01/09/15 1832  . dextromethorphan-guaiFENesin (MUCINEX DM) 30-600 MG per 12 hr tablet 1 tablet  1 tablet Oral BID Erlene Quan, PA-C   1 tablet at 01/09/15 2145  . furosemide (LASIX) tablet 40 mg  40 mg Oral BID Flossie Dibble, MD   40 mg at 01/09/15 1832  . heparin injection 5,000 Units  5,000 Units Subcutaneous 3 times per day Flossie Dibble, MD   5,000 Units at 01/10/15 0650  . isosorbide mononitrate (IMDUR) 24 hr tablet 30 mg  30 mg Oral Daily Flossie Dibble, MD   30 mg at  01/09/15 1019  . lisinopril (PRINIVIL,ZESTRIL) tablet 20 mg  20 mg Oral q morning - 10a Flossie Dibble, MD   20 mg at 01/09/15 1019  . loratadine (CLARITIN) tablet 10 mg  10 mg Oral Daily Flossie Dibble, MD   10 mg at 01/09/15 2200  . nitroGLYCERIN (NITROSTAT) SL tablet 0.4 mg  0.4 mg Sublingual Q5 Min x 3 PRN Flossie Dibble, MD      . potassium chloride (K-DUR) CR tablet 40 mEq  40 mEq Oral Daily Josue Hector, MD   40 mEq at 01/09/15 0850    PE: General appearance: alert, cooperative and no distress Lungs: clear to auscultation bilaterally Heart: regular rate and rhythm, S1, S2 normal, no murmur, click, rub or gallop Extremities: No LEE Pulses: 2+ and symmetric Skin: Warm and dry Neurologic: Grossly normal  Lab Results:   Recent Labs  01/08/15 2116 01/09/15 0352  WBC 7.1 6.2  HGB 13.9 13.9  HCT 40.0 39.4  PLT 206 191   BMET  Recent Labs  01/08/15 2116 01/09/15 0352 01/09/15 0910  NA 136 137 138  K 3.8 3.4* 3.4*  CL 99 104  101  CO2 27 28 27   GLUCOSE 102* 129* 163*  BUN 20 17 17   CREATININE 1.02 1.01 0.99  CALCIUM 9.5 9.1 9.1   PT/INR  Recent Labs  01/09/15 0352  LABPROT 15.2  INR 1.19   Cholesterol  Recent Labs  01/09/15 0910  CHOL 188   Cardiac Panel (last 3 results)  Recent Labs  01/08/15 2116 01/09/15 0352 01/09/15 0910  TROPONINI <0.03 0.03 <0.03    Assessment/Plan 69 y.o. male with known history of CAD and risk factors (hypertension, HLD), allergies, ischemic cardiomyopathy LVEF 40 -45%, OA, hemorrhoids who presented with indigestion. Patient was in his usual state of health when 2-3 days ago he woke up with SOB.    Chest pain with high risk of acute coronary syndrome  He has ruled out for MI.  R/L heart caths on Tuesday.     Hypertension He has had some mild hypotensive but looks good right now.  Me be related to the lasix; one dose 40mg  IV then two doses of 40 PO in ~24 hours.     Hyperlipidemia   CAD S/P percutaneous coronary  angioplasty   Chronic combined systolic and diastolic CHF (congestive heart failure)  Net fluids: +0.2L/-1.0L.  Holding lasix this morning due to hypotension recently.  He appears euvolemic.   Statin intolerance   Hypotension  Holding lasix this morning   Hypokalemia  Replace this morning.     Ambulate this morning.  If no issues he could be discharged with caths on Tuesday.  Tarri Fuller PA-C 01/10/2015 8:46 AM  Cardiology Attending  Patient seen and examined. Agree with Mr. Amador Cunas note. The patient is stable for discharge and followup for outpatient cath. I have warned him not to exert himself, take lasix as needed and slntg and return on Tuesday for catheterization. I spent 30 minutes answering his many questions.   Mikle Bosworth.D.

## 2015-01-12 NOTE — Interval H&P Note (Signed)
History and Physical Interval Note:  01/12/2015 11:04 AM  Kenneth [name]  has presented today for surgery, with the diagnosis of cp  The various methods of treatment have been discussed with the patient and family. After consideration of risks, benefits and other options for treatment, the patient has consented to  Procedure(s): LEFT AND RIGHT HEART CATHETERIZATION WITH CORONARY ANGIOGRAM (N/A) as a surgical intervention .  The patient's history has been reviewed, patient examined, no change in status, stable for surgery.  I have reviewed the patient's chart and labs.  Questions were answered to the patient's satisfaction.    Cath Lab Visit (complete for each Cath Lab visit)  Clinical Evaluation Leading to the Procedure:   ACS: No.  Non-ACS:    Anginal Classification: CCS III  Anti-ischemic medical therapy: Maximal Therapy (2 or more classes of medications)  Non-Invasive Test Results: No non-invasive testing performed  Prior CABG: No previous CABG       Kenneth [name] [facility] 01/12/2015 11:04 AM

## 2015-01-12 NOTE — Telephone Encounter (Signed)
Dr.Jordan reviewed 01/07/15 echo on 01/11/15.He will discuss with patient at cardiac cath 01/12/15.

## 2015-01-12 NOTE — Discharge Instructions (Signed)
Stop taking amlodipine  Reduce lasix to 40 mg once a day.  You may return to work next Monday  Angiogram, Care After Refer to this sheet in the next few weeks. These instructions provide you with information on caring for yourself after your procedure. Your health care provider may also give you more specific instructions. Your treatment has been planned according to current medical practices, but problems sometimes occur. Call your health care provider if you have any problems or questions after your procedure.  WHAT TO EXPECT AFTER THE PROCEDURE After your procedure, it is typical to have the following sensations:  Minor discomfort or tenderness and a small bump at the catheter insertion site. The bump should usually decrease in size and tenderness within 1 to 2 weeks.  Any bruising will usually fade within 2 to 4 weeks. HOME CARE INSTRUCTIONS   You may need to keep taking blood thinners if they were prescribed for you. Take medicines only as directed by your health care provider.  Do not apply powder or lotion to the site.  Do not take baths, swim, or use a hot tub until your health care provider approves.  You may shower 24 hours after the procedure. Remove the bandage (dressing) and gently wash the site with plain soap and water. Gently pat the site dry.  Inspect the site at least twice daily.  Limit your activity for the first 48 hours. Do not bend, squat, or lift anything over 20 lb (9 kg) or as directed by your health care provider.  Plan to have someone take you home after the procedure. Follow instructions about when you can drive or return to work. SEEK MEDICAL CARE IF:  You get light-headed when standing up.  You have drainage (other than a small amount of blood on the dressing).  You have chills.  You have a fever.  You have redness, warmth, swelling, or pain at the insertion site. SEEK IMMEDIATE MEDICAL CARE IF:   You develop chest pain or shortness of breath,  feel faint, or pass out.  You have bleeding, swelling larger than a walnut, or drainage from the catheter insertion site.  You develop pain, discoloration, coldness, or severe bruising in the leg or arm that held the catheter.  You develop bleeding from any other place, such as the bowels. You may see bright red blood in your urine or stools, or your stools may appear black and tarry.  You have heavy bleeding from the site. If this happens, hold pressure on the site. MAKE SURE YOU:  Understand these instructions.  Will watch your condition.  Will get help right away if you are not doing well or get worse. Document Released: 05/04/2005 Document Revised: 03/02/2014 Document Reviewed: 03/10/2013 [facility] Patient Information 2015 [place], [place]. This information is not intended to replace advice given to you by your health care provider. Make sure you discuss any questions you have with your health care provider.

## 2015-01-12 NOTE — Research (Signed)
Bioflow Informed Consent   Subject Name: Kenneth [name]  Subject met inclusion and exclusion criteria.  The informed consent form, study requirements and expectations were reviewed with the subject and questions and concerns were addressed prior to the signing of the consent form.  The subject verbalized understanding of the trail requirements.  The subject agreed to participate in the Bioflow trial and signed the informed consent.  The informed consent was obtained prior to performance of any protocol-specific procedures for the subject.  A copy of the signed informed consent was given to the subject and a copy was placed in the subject's medical record.  Sandie Ano 01/12/2015, 8:50

## 2015-01-12 NOTE — Progress Notes (Signed)
Site area: RFA Site Prior to Removal:  Level 0 Pressure Applied For:28min Manual: yes   Patient Status During Pull:  stable Post Pull Site:  Level 0 Post Pull Instructions Given:  yes Post Pull Pulses Present: palpable Dressing Applied:  clear Bedrest begins @ 1250 Comments:

## 2015-01-13 LAB — POCT I-STAT 3, ART BLOOD GAS (G3+)
ACID-BASE DEFICIT: 3 mmol/L — AB (ref 0.0–2.0)
Bicarbonate: 20.7 mEq/L (ref 20.0–24.0)
O2 SAT: 94 %
PCO2 ART: 31.5 mmHg — AB (ref 35.0–45.0)
TCO2: 22 mmol/L (ref 0–100)
pH, Arterial: 7.426 (ref 7.350–7.450)
pO2, Arterial: 66 mmHg — ABNORMAL LOW (ref 80.0–100.0)

## 2015-01-13 LAB — POCT I-STAT 3, VENOUS BLOOD GAS (G3P V)
ACID-BASE DEFICIT: 2 mmol/L (ref 0.0–2.0)
Bicarbonate: 21.9 mEq/L (ref 20.0–24.0)
O2 Saturation: 67 %
PH VEN: 7.403 — AB (ref 7.250–7.300)
TCO2: 23 mmol/L (ref 0–100)
pCO2, Ven: 35.2 mmHg — ABNORMAL LOW (ref 45.0–50.0)
pO2, Ven: 34 mmHg (ref 30.0–45.0)

## 2015-01-15 ENCOUNTER — Ambulatory Visit: Payer: Commercial Managed Care - HMO | Admitting: Cardiology

## 2015-01-25 ENCOUNTER — Encounter: Payer: Self-pay | Admitting: Cardiology

## 2015-01-25 ENCOUNTER — Ambulatory Visit (INDEPENDENT_AMBULATORY_CARE_PROVIDER_SITE_OTHER): Payer: Commercial Managed Care - HMO | Admitting: Cardiology

## 2015-01-25 VITALS — BP 114/72 | HR 64 | Ht 68.0 in | Wt 214.5 lb

## 2015-01-25 DIAGNOSIS — I5042 Chronic combined systolic (congestive) and diastolic (congestive) heart failure: Secondary | ICD-10-CM | POA: Diagnosis not present

## 2015-01-25 DIAGNOSIS — Z79899 Other long term (current) drug therapy: Secondary | ICD-10-CM

## 2015-01-25 LAB — BASIC METABOLIC PANEL
BUN: 12 mg/dL (ref 6–23)
CHLORIDE: 104 meq/L (ref 96–112)
CO2: 28 mEq/L (ref 19–32)
CREATININE: 0.84 mg/dL (ref 0.50–1.35)
Calcium: 8.9 mg/dL (ref 8.4–10.5)
Glucose, Bld: 93 mg/dL (ref 70–99)
Potassium: 5 mEq/L (ref 3.5–5.3)
Sodium: 138 mEq/L (ref 135–145)

## 2015-01-25 NOTE — Progress Notes (Signed)
Cardiology Office Note   Date:  01/25/2015   ID:  Kenneth [name], DOB 05-12-1946, MRN [id number]  PCP:  Velna Hatchet, MD  Cardiologist:  Dr. Martinique    Chief Complaint  Patient presents with  . Hospitalization Follow-up    POST  RIGHT  AND LEFT CATH 01/12/15, NO CHEST PAIN , NO SWELLING-------has question about some of medications.      History of Present Illness: Kenneth [name] is a 69 y.o. male who presents for post hospital/cath follow up.  Hospitalized 01/08/15-01/10/15 with DOE that began increasing -he was scheduled for cath but not until the 18th.   Cath revelaed non obstructive disease, with moderate LV dysfunction with inf. Wall motion abnormality.  Mild to moderat MR, normal RHC.  Lasix reduced, amlodipine was stopped.   Dr. Martinique thought to begin aldactone if BP allows.  EF 35-40%   Today no SOB, pt with many questions about his MV in 2014 had mild MR. Pt aware of monitoring salt, weighing daily.       Past Medical History  Diagnosis Date  . Hypertension   . Hyperlipidemia   . Tobacco abuse   . Coronary artery disease     a. 1999: s/p PTCA;  b. 11/2012 Cath/PCI: LM mild plaque, LAD 60-42m, LCX 30p, 28m, OM1 59m, RCA 30-40ost, 40-4m, 80-60m/d (3.0x38, 3.0x32, 3.0x24 Promus Premier DESs), 70-80d, EF 40% inf HK;  c. 12/2012 Staged PCI of LAD w/ 3.0x12 Promus Premier DES.   Marland Kitchen Acute on chronic combined systolic and diastolic CHF (congestive heart failure) 09/18/2013  . Squamous cell carcinoma     Back  . Myocardial infarction     '99- MI,"angio[plasty", Stents 3'14  . H/O seasonal allergies   . Diabetes mellitus without complication     type 2 , no oral meds in 2 yrs.     . Elevated uric acid in blood   . Arthritis     osteoarthritis-knees, hands, wrist, fingers    Past Surgical History  Procedure Laterality Date  . Coronary angioplasty  1999  . Coronary angioplasty with stent placement  01/14/2013    DES to LAD    Dr Peter Martinique  . Hernia repair  2004 ?  Marland Kitchen  Carpal tunnel release Left   . Ankle surgery  1960  . Total knee arthroplasty Right 06/08/2014    Procedure: RIGHT TOTAL KNEE ARTHROPLASTY;  Surgeon: Gearlean Alf, MD;  Location: [facility];  Service: Orthopedics;  Laterality: Right;  . Left heart catheterization with coronary angiogram N/A 12/27/2012    Procedure: LEFT HEART CATHETERIZATION WITH CORONARY ANGIOGRAM;  Surgeon: Jolaine Artist, MD;  Location: [facility] CATH LAB;  Service: Cardiovascular;  Laterality: N/A;  . Percutaneous coronary stent intervention (pci-s) N/A 12/30/2012    Procedure: PERCUTANEOUS CORONARY STENT INTERVENTION (PCI-S);  Surgeon: Peter M Martinique, MD;  Location: [facility] CATH LAB;  Service: Cardiovascular;  Laterality: N/A;  . Percutaneous coronary stent intervention (pci-s) N/A 01/14/2013    Procedure: PERCUTANEOUS CORONARY STENT INTERVENTION (PCI-S);  Surgeon: Peter M Martinique, MD;  Location: [facility] CATH LAB;  Service: Cardiovascular;  Laterality: N/A;  . Left and right heart catheterization with coronary angiogram N/A 01/12/2015    Procedure: LEFT AND RIGHT HEART CATHETERIZATION WITH CORONARY ANGIOGRAM;  Surgeon: Peter M Martinique, MD;  Location: [facility] CATH LAB;  Service: Cardiovascular;  Laterality: N/A;     Current Outpatient Prescriptions  Medication Sig Dispense Refill  . allopurinol (ZYLOPRIM) 300 MG tablet Take 300 mg by mouth daily.    Marland Kitchen  aspirin EC 81 MG tablet Take 81 mg by mouth daily.    . carvedilol (COREG) 6.25 MG tablet Take 1 tablet (6.25 mg total) by mouth 2 (two) times daily. 180 tablet 3  . cetirizine (ZYRTEC) 10 MG tablet Take 10 mg by mouth daily as needed for allergies.     Marland Kitchen clopidogrel (PLAVIX) 75 MG tablet Take 1 tablet by mouth daily.    . colchicine 0.6 MG tablet Take 0.6 mg by mouth daily as needed (Gout).    . colesevelam (WELCHOL) 625 MG tablet Take 1 tablet (625 mg total) by mouth 2 (two) times daily with a meal. 30 tablet 5  . fluticasone (FLONASE) 50 MCG/ACT nasal spray Place 2 sprays into the nose daily as  needed for allergies.     . furosemide (LASIX) 40 MG tablet Take 1 tablet (40 mg total) by mouth daily. 30 tablet   . isosorbide mononitrate (IMDUR) 30 MG 24 hr tablet Take 1 tablet (30 mg total) by mouth daily. 30 tablet 5  . lisinopril (PRINIVIL,ZESTRIL) 40 MG tablet Take 20 mg by mouth every morning.    . Multiple Vitamin (MULTIVITAMIN) tablet Take 1 tablet by mouth daily.    . nitroGLYCERIN (NITROSTAT) 0.4 MG SL tablet Place 1 tablet (0.4 mg total) under the tongue every 5 (five) minutes x 3 doses as needed for chest pain. 25 tablet 2  . potassium chloride 20 MEQ TBCR Take 20 mEq by mouth daily as needed. 30 tablet 5  . Probiotic Product (PROBIOTIC DAILY PO) Take 1 capsule by mouth daily.    Marland Kitchen tetrahydrozoline 0.05 % ophthalmic solution Place 1 drop into both eyes 2 (two) times daily as needed (Itchy eyes).     No current facility-administered medications for this visit.    Allergies:   Crestor; Lipitor; Pravastatin sodium; and Zocor    Social History:  The patient  reports that he has been smoking Cigars.  He has never used smokeless tobacco. He reports that he drinks about 3.6 oz of alcohol per week. He reports that he does not use illicit drugs.   Family History:  The patient's family history includes Alzheimer's disease in his mother; Arrhythmia in his sister; CVA in his father; Diabetes in his mother; Hypertension in his mother.    ROS:  General:no colds or fevers, no weight changes Skin:no rashes or ulcers HEENT:no blurred vision, no congestion CV:see HPI PUL:see HPI GI:no diarrhea constipation or melena, no indigestion GU:no hematuria, no dysuria MS:no joint pain, no claudication Neuro:no syncope, no lightheadedness Endo:no diabetes, no thyroid disease  Wt Readings from Last 3 Encounters:  01/25/15 214 lb 8 oz (97.297 kg)  01/10/15 210 lb 4.8 oz (95.391 kg)  01/06/15 207 lb 11.2 oz (94.212 kg)     PHYSICAL EXAM: VS:  BP 114/72 mmHg  Pulse 64  Ht 5\' 8"  (1.727 m)   Wt 214 lb 8 oz (97.297 kg)  BMI 32.62 kg/m2 , BMI Body mass index is 32.62 kg/(m^2). General:Pleasant affect, NAD Skin:Warm and dry, brisk capillary refill HEENT:normocephalic, sclera clear, mucus membranes moist Neck:supple, no JVD, no bruits  Heart:S1S2 RRR with  murmur, gallup, rub or click Lungs:clear without rales, rhonchi, or wheezes DYJ:WLKH, non tender, + BS, do not palpate liver spleen or masses Ext:no lower ext edema, 2+ pedal pulses, 2+ radial pulses Neuro:alert and oriented, MAE, follows commands, + facial symmetry    EKG:  EKG is  Not ordered today.    Recent Labs: 01/09/2015: ALT 21; BUN 17;  Creatinine 0.99; Hemoglobin 13.9; Platelets 191; Potassium 3.4*; Sodium 138; TSH 2.170    Lipid Panel    Component Value Date/Time   CHOL 188 01/09/2015 0910   TRIG 199* 01/09/2015 0910   HDL 31* 01/09/2015 0910   CHOLHDL 6.1 01/09/2015 0910   VLDL 40 01/09/2015 0910   LDLCALC 117* 01/09/2015 0910       Other studies Reviewed: Additional studies/ records that were reviewed today include: hospitalization.   ASSESSMENT AND PLAN:  CHF, improved with lasix and stable at cath, doing well on lasix 40 mg daily. EF 45% by echo, 35-40% by Cath also diastolic dysfunction G2  Mod-severe MR by echo though on cath mild to mod.  Normal Rt heart pressures.  CAD non obstructive disease.  Previous stents patent.   HTN stable soft today, held aldactone this visit.  Follow up with Dr. P. Martinique in May.  Will continue imdur for now.   Current medicines are reviewed with the patient today.  The patient Has no concerns regarding medicines.  The following changes have been made:  See above Labs/ tests ordered today include:see above  Disposition:   FU:  see above  Lennie Muckle, NP  01/25/2015 11:36 AM    [facility] [place], [place], [place], [place] Phone: [phone]; Fax: [phone]

## 2015-01-25 NOTE — Patient Instructions (Signed)
LAB TODAY---BMP  NO CHANGE IN CURRENT  MEDICATIONS  Your physician recommends that you schedule a follow-up appointment with Dr Martinique - Mar 11 2015 AT 10 :72 PM

## 2015-01-26 ENCOUNTER — Telehealth: Payer: Self-pay | Admitting: *Deleted

## 2015-01-26 NOTE — Telephone Encounter (Signed)
Spoke to patient. Result given . Verbalized understanding Medication direction change on patient's CHL list

## 2015-01-26 NOTE — Telephone Encounter (Signed)
-----   Message from Isaiah Serge, NP sent at 01/25/2015  8:45 PM EDT ----- Decrease K+ to half a tab daily.

## 2015-02-09 ENCOUNTER — Emergency Department (HOSPITAL_COMMUNITY): Payer: Commercial Managed Care - HMO

## 2015-02-09 ENCOUNTER — Inpatient Hospital Stay (HOSPITAL_COMMUNITY)
Admission: EM | Admit: 2015-02-09 | Discharge: 2015-02-12 | DRG: 291 | Disposition: A | Discharge status: Home / Self Care | Payer: Commercial Managed Care - HMO | Attending: Internal Medicine | Admitting: Internal Medicine

## 2015-02-09 ENCOUNTER — Other Ambulatory Visit (HOSPITAL_COMMUNITY): Payer: Self-pay

## 2015-02-09 DIAGNOSIS — Z7982 Long term (current) use of aspirin: Secondary | ICD-10-CM

## 2015-02-09 DIAGNOSIS — J9601 Acute respiratory failure with hypoxia: Secondary | ICD-10-CM | POA: Diagnosis present

## 2015-02-09 DIAGNOSIS — Z6829 Body mass index (BMI) 29.0-29.9, adult: Secondary | ICD-10-CM

## 2015-02-09 DIAGNOSIS — I160 Hypertensive urgency: Secondary | ICD-10-CM | POA: Diagnosis present

## 2015-02-09 DIAGNOSIS — I2510 Atherosclerotic heart disease of native coronary artery without angina pectoris: Secondary | ICD-10-CM | POA: Diagnosis present

## 2015-02-09 DIAGNOSIS — I255 Ischemic cardiomyopathy: Secondary | ICD-10-CM | POA: Diagnosis present

## 2015-02-09 DIAGNOSIS — I5043 Acute on chronic combined systolic (congestive) and diastolic (congestive) heart failure: Secondary | ICD-10-CM | POA: Diagnosis not present

## 2015-02-09 DIAGNOSIS — Z888 Allergy status to other drugs, medicaments and biological substances status: Secondary | ICD-10-CM

## 2015-02-09 DIAGNOSIS — Z720 Tobacco use: Secondary | ICD-10-CM | POA: Diagnosis present

## 2015-02-09 DIAGNOSIS — E119 Type 2 diabetes mellitus without complications: Secondary | ICD-10-CM | POA: Diagnosis present

## 2015-02-09 DIAGNOSIS — I252 Old myocardial infarction: Secondary | ICD-10-CM

## 2015-02-09 DIAGNOSIS — I10 Essential (primary) hypertension: Secondary | ICD-10-CM | POA: Diagnosis present

## 2015-02-09 DIAGNOSIS — M1990 Unspecified osteoarthritis, unspecified site: Secondary | ICD-10-CM | POA: Diagnosis present

## 2015-02-09 DIAGNOSIS — E785 Hyperlipidemia, unspecified: Secondary | ICD-10-CM | POA: Diagnosis present

## 2015-02-09 DIAGNOSIS — F17210 Nicotine dependence, cigarettes, uncomplicated: Secondary | ICD-10-CM | POA: Diagnosis present

## 2015-02-09 DIAGNOSIS — J810 Acute pulmonary edema: Secondary | ICD-10-CM

## 2015-02-09 DIAGNOSIS — Z96651 Presence of right artificial knee joint: Secondary | ICD-10-CM | POA: Diagnosis present

## 2015-02-09 DIAGNOSIS — I340 Nonrheumatic mitral (valve) insufficiency: Secondary | ICD-10-CM | POA: Diagnosis present

## 2015-02-09 DIAGNOSIS — F17290 Nicotine dependence, other tobacco product, uncomplicated: Secondary | ICD-10-CM | POA: Diagnosis present

## 2015-02-09 DIAGNOSIS — R0602 Shortness of breath: Secondary | ICD-10-CM | POA: Diagnosis not present

## 2015-02-09 DIAGNOSIS — M109 Gout, unspecified: Secondary | ICD-10-CM | POA: Diagnosis present

## 2015-02-09 DIAGNOSIS — Z9861 Coronary angioplasty status: Secondary | ICD-10-CM

## 2015-02-09 DIAGNOSIS — E669 Obesity, unspecified: Secondary | ICD-10-CM | POA: Diagnosis present

## 2015-02-09 DIAGNOSIS — D649 Anemia, unspecified: Secondary | ICD-10-CM | POA: Diagnosis present

## 2015-02-09 MED ORDER — NITROGLYCERIN IN D5W 200-5 MCG/ML-% IV SOLN
10.0000 ug/min | INTRAVENOUS | Status: DC
  Administered 2015-02-10: 50 ug/min via INTRAVENOUS
  Filled 2015-02-09: qty 250

## 2015-02-09 MED ORDER — FUROSEMIDE 10 MG/ML IJ SOLN
80.0000 mg | Freq: Once | INTRAMUSCULAR | Status: AC
  Administered 2015-02-10: 80 mg via INTRAVENOUS
  Filled 2015-02-09: qty 8

## 2015-02-09 MED ORDER — ASPIRIN 81 MG PO CHEW
324.0000 mg | CHEWABLE_TABLET | Freq: Once | ORAL | Status: AC
  Administered 2015-02-10: 324 mg via ORAL
  Filled 2015-02-09: qty 4

## 2015-02-09 NOTE — ED Notes (Signed)
The patient has been having SOB on and off all day.  The patient has had an MI in the past and he started having "tingling" in his chest so he called EMS.  Upon EMS arrival, the patient was diaphoretic, in respiratory distress and his oxygen level was 90%.  The patient was also complaining of chest tingling.  The patient has an #18 gauge placed by EMS and they gave him four SL nitros.  EMS placed him on a non-rebreather and his oxygen level came up to 96%.  The patient was placed on CPAP given an albutetol/atrovent breathing treatment and transported here.  The patient is a G[employer]-15.

## 2015-02-09 NOTE — ED Provider Notes (Signed)
CSN: [id number]     Arrival date & time    History  This chart was scribed for Evelina Bucy, MD by Rayfield Citizen, ED Scribe. This patient was seen in room [facility] and the patient's care was started at 11:48 PM.    No chief complaint on file.  Patient is a 69 y.o. male presenting with shortness of breath. The history is provided by the patient and the EMS personnel. No language interpreter was used.  Shortness of Breath Severity:  Severe Onset quality:  Gradual Duration:  1 day Timing:  Intermittent Progression:  Worsening Chronicity:  Recurrent Relieved by:  None tried Worsened by:  Nothing tried Ineffective treatments:  None tried Associated symptoms: cough   Associated symptoms: no chest pain, no fever and no vomiting      HPI Comments: Kenneth [name] is a 69 y.o. male who presents to the Emergency Department via EMS for respiratory distress. Patient reports intermittent SOB throughout the day today; he began to experience "tingling" in his chest, which he states was similar to his prior MI symptoms, prompting him to call EMS. Upon EMS arrival, patient was diaphoretic and in respiratory distress (O2 sats 90%). He was given 4 SL NTGs; placed on nonrebreather with O2 sat improvement to 96%. Patient also notes an episode of cough and post-nasal drip last night (02/08/15). He denies recent fever, nausea, vomiting, chest pain.   Cardiology care managed by Dr. Peter Martinique; "I have a leaky mitral valve." PCP is Dr. Ardeth Perfect. He takes 40mg  Lasix daily; states he has been compliant.   Past Medical History  Diagnosis Date  . Hypertension   . Hyperlipidemia   . Tobacco abuse   . Coronary artery disease     a. 1999: s/p PTCA;  b. 11/2012 Cath/PCI: LM mild plaque, LAD 60-35m, LCX 30p, 16m, OM1 21m, RCA 30-40ost, 40-4m, 80-75m/d (3.0x38, 3.0x32, 3.0x24 Promus Premier DESs), 70-80d, EF 40% inf HK;  c. 12/2012 Staged PCI of LAD w/ 3.0x12 Promus Premier DES.   Marland Kitchen Acute on chronic combined systolic  and diastolic CHF (congestive heart failure) 09/18/2013  . Squamous cell carcinoma     Back  . Myocardial infarction     '99- MI,"angio[plasty", Stents 3'14  . H/O seasonal allergies   . Diabetes mellitus without complication     type 2 , no oral meds in 2 yrs.     . Elevated uric acid in blood   . Arthritis     osteoarthritis-knees, hands, wrist, fingers   Past Surgical History  Procedure Laterality Date  . Coronary angioplasty  1999  . Coronary angioplasty with stent placement  01/14/2013    DES to LAD    Dr Peter Martinique  . Hernia repair  2004 ?  Marland Kitchen Carpal tunnel release Left   . Ankle surgery  1960  . Total knee arthroplasty Right 06/08/2014    Procedure: RIGHT TOTAL KNEE ARTHROPLASTY;  Surgeon: Gearlean Alf, MD;  Location: [facility];  Service: Orthopedics;  Laterality: Right;  . Left heart catheterization with coronary angiogram N/A 12/27/2012    Procedure: LEFT HEART CATHETERIZATION WITH CORONARY ANGIOGRAM;  Surgeon: Jolaine Artist, MD;  Location: [facility] CATH LAB;  Service: Cardiovascular;  Laterality: N/A;  . Percutaneous coronary stent intervention (pci-s) N/A 12/30/2012    Procedure: PERCUTANEOUS CORONARY STENT INTERVENTION (PCI-S);  Surgeon: Peter M Martinique, MD;  Location: [facility] CATH LAB;  Service: Cardiovascular;  Laterality: N/A;  . Percutaneous coronary stent intervention (pci-s) N/A 01/14/2013    Procedure:  PERCUTANEOUS CORONARY STENT INTERVENTION (PCI-S);  Surgeon: Peter M Martinique, MD;  Location: [facility] CATH LAB;  Service: Cardiovascular;  Laterality: N/A;  . Left and right heart catheterization with coronary angiogram N/A 01/12/2015    Procedure: LEFT AND RIGHT HEART CATHETERIZATION WITH CORONARY ANGIOGRAM;  Surgeon: Peter M Martinique, MD;  Location: [facility] CATH LAB;  Service: Cardiovascular;  Laterality: N/A;   Family History  Problem Relation Age of Onset  . Arrhythmia Sister     s/p PPM  . Alzheimer's disease Mother   . Hypertension Mother   . Diabetes Mother   . CVA Father     History  Substance Use Topics  . Smoking status: Light Tobacco Smoker -- 15 years    Types: Cigars    Last Attempt to Quit: 09/02/2013  . Smokeless tobacco: Never Used     Comment: rare ciagar on golf days-variable days 1-2 per month  . Alcohol Use: 3.6 oz/week    6 Cans of beer per week     Comment: 6-pack of beer/weekend    Review of Systems  Constitutional: Negative for fever.  HENT: Positive for postnasal drip.   Respiratory: Positive for cough and shortness of breath.   Cardiovascular: Negative for chest pain.  Gastrointestinal: Negative for nausea and vomiting.  All other systems reviewed and are negative.     Allergies  Crestor; Lipitor; Pravastatin sodium; and Zocor  Home Medications   Prior to Admission medications   Medication Sig Start Date End Date Taking? Authorizing Provider  allopurinol (ZYLOPRIM) 300 MG tablet Take 300 mg by mouth daily.    Historical Provider, MD  aspirin EC 81 MG tablet Take 81 mg by mouth daily.    Historical Provider, MD  carvedilol (COREG) 6.25 MG tablet Take 1 tablet (6.25 mg total) by mouth 2 (two) times daily. 10/16/14   Peter M Martinique, MD  cetirizine (ZYRTEC) 10 MG tablet Take 10 mg by mouth daily as needed for allergies.     Historical Provider, MD  clopidogrel (PLAVIX) 75 MG tablet Take 1 tablet by mouth daily. 07/04/14   Historical Provider, MD  colchicine 0.6 MG tablet Take 0.6 mg by mouth daily as needed (Gout).    Historical Provider, MD  colesevelam (WELCHOL) 625 MG tablet Take 1 tablet (625 mg total) by mouth 2 (two) times daily with a meal. 01/10/15   Brett Canales, PA-C  fluticasone (FLONASE) 50 MCG/ACT nasal spray Place 2 sprays into the nose daily as needed for allergies.  12/31/12   Ripudeep Krystal Eaton, MD  furosemide (LASIX) 40 MG tablet Take 1 tablet (40 mg total) by mouth daily. 01/12/15   Peter M Martinique, MD  isosorbide mononitrate (IMDUR) 30 MG 24 hr tablet Take 1 tablet (30 mg total) by mouth daily. 01/06/15   Isaiah Serge,  NP  lisinopril (PRINIVIL,ZESTRIL) 40 MG tablet Take 20 mg by mouth every morning.    Historical Provider, MD  Multiple Vitamin (MULTIVITAMIN) tablet Take 1 tablet by mouth daily.    Historical Provider, MD  nitroGLYCERIN (NITROSTAT) 0.4 MG SL tablet Place 1 tablet (0.4 mg total) under the tongue every 5 (five) minutes x 3 doses as needed for chest pain. 01/10/15   Erlene Quan, PA-C  potassium chloride 20 MEQ TBCR Take 20 mEq by mouth daily as needed. Patient taking differently: Take 20 mEq by mouth daily as needed. 1/2 TABLET DAILY (STARTING 01/26/15) 01/10/15   Brett Canales, PA-C  Probiotic Product (PROBIOTIC DAILY PO) Take 1 capsule  by mouth daily.    Historical Provider, MD  tetrahydrozoline 0.05 % ophthalmic solution Place 1 drop into both eyes 2 (two) times daily as needed (Itchy eyes).    Historical Provider, MD   There were no vitals taken for this visit. Physical Exam  Constitutional: He is oriented to person, place, and time. He appears well-developed and well-nourished. No distress.  HENT:  Head: Normocephalic and atraumatic.  Mouth/Throat: Oropharynx is clear and moist. No oropharyngeal exudate.  Eyes: EOM are normal. Pupils are equal, round, and reactive to light.  Neck: Normal range of motion. Neck supple.  Cardiovascular: Normal rate and regular rhythm.  Exam reveals no friction rub.   No murmur heard. Pulmonary/Chest: He is in respiratory distress (mild). He has no wheezes. He has rales (basilar and mid-lung fields).  Abdominal: Soft. He exhibits no distension. There is no tenderness. There is no rebound.  Musculoskeletal: Normal range of motion. He exhibits no edema.  Neurological: He is alert and oriented to person, place, and time.  Skin: No rash noted. He is not diaphoretic.  Nursing note and vitals reviewed.   ED Course  Procedures  DIAGNOSTIC STUDIES: Oxygen Saturation is 100% on RA, normal by my interpretation.    COORDINATION OF CARE: 11:52 PM Discussed  treatment plan with pt at bedside and pt agreed to plan.   Labs Review Labs Reviewed  CBC  COMPREHENSIVE METABOLIC PANEL  BRAIN NATRIURETIC PEPTIDE  I-STAT [place], ED    Imaging Review Dg Chest Portable 1 View  02/10/2015   CLINICAL DATA:  Shortness of breath on-and off for one day.  EXAM: PORTABLE CHEST - 1 VIEW  COMPARISON:  Frontal and lateral views 01/08/2015  FINDINGS: The heart is enlarged. There is progressive vascular congestion. Question mild pulmonary edema. Blunting of both costophrenic angles. Ill-defined bibasilar opacities, likely atelectasis. No pneumothorax.  IMPRESSION: Progressive cardiomegaly, vascular congestion and development mild pulmonary edema. Findings suggest CHF.   Electronically Signed   By: Jeb Levering M.D.   On: 02/10/2015 00:46     EKG Interpretation None     CRITICAL CARE Performed by: Osvaldo Shipper   Total critical care time: 30 minutes  Critical care time was exclusive of separately billable procedures and treating other patients.  Critical care was necessary to treat or prevent imminent or life-threatening deterioration.  Critical care was time spent personally by me on the following activities: development of treatment plan with patient and/or surrogate as well as nursing, discussions with consultants, evaluation of patient's response to treatment, examination of patient, obtaining history from patient or surrogate, ordering and performing treatments and interventions, ordering and review of laboratory studies, ordering and review of radiographic studies, pulse oximetry and re-evaluation of patient's condition.  MDM   Final diagnoses:  Flash pulmonary edema  Shortness of breath    52M here with acute shortness of breath. History of 5 stents placed previously with recent calf showing normal stents. History of recently diagnosed CHF. Had acute onset of shortness of breath without chest pain at home. EMS arrived and gave him 5  sublingual nitroglycerin and placed him on CPAP. Upon arrival was feeling much better and his symptoms have improved. He was initially placed on BiPAP, but was doing well without it and was filed off the BiPAP soon after arrival. IV nitroglycerin started for his flash pulmonary edema. Admitted to stepdown. Lasix was given with good diuresis and troponin was normal.    I personally performed the services described in this documentation,  which was scribed in my presence. The recorded information has been reviewed and is accurate.       Evelina Bucy, MD 02/10/15 [phone]

## 2015-02-10 ENCOUNTER — Encounter (HOSPITAL_COMMUNITY): Payer: Self-pay | Admitting: Emergency Medicine

## 2015-02-10 DIAGNOSIS — Z7982 Long term (current) use of aspirin: Secondary | ICD-10-CM | POA: Diagnosis not present

## 2015-02-10 DIAGNOSIS — Z9861 Coronary angioplasty status: Secondary | ICD-10-CM | POA: Diagnosis not present

## 2015-02-10 DIAGNOSIS — I340 Nonrheumatic mitral (valve) insufficiency: Secondary | ICD-10-CM | POA: Diagnosis not present

## 2015-02-10 DIAGNOSIS — Z888 Allergy status to other drugs, medicaments and biological substances status: Secondary | ICD-10-CM | POA: Diagnosis not present

## 2015-02-10 DIAGNOSIS — E669 Obesity, unspecified: Secondary | ICD-10-CM | POA: Diagnosis not present

## 2015-02-10 DIAGNOSIS — I10 Essential (primary) hypertension: Secondary | ICD-10-CM

## 2015-02-10 DIAGNOSIS — Z6829 Body mass index (BMI) 29.0-29.9, adult: Secondary | ICD-10-CM | POA: Diagnosis not present

## 2015-02-10 DIAGNOSIS — I252 Old myocardial infarction: Secondary | ICD-10-CM | POA: Diagnosis not present

## 2015-02-10 DIAGNOSIS — R0602 Shortness of breath: Secondary | ICD-10-CM | POA: Diagnosis present

## 2015-02-10 DIAGNOSIS — D649 Anemia, unspecified: Secondary | ICD-10-CM | POA: Diagnosis present

## 2015-02-10 DIAGNOSIS — M109 Gout, unspecified: Secondary | ICD-10-CM | POA: Diagnosis present

## 2015-02-10 DIAGNOSIS — Z96651 Presence of right artificial knee joint: Secondary | ICD-10-CM | POA: Diagnosis present

## 2015-02-10 DIAGNOSIS — I2510 Atherosclerotic heart disease of native coronary artery without angina pectoris: Secondary | ICD-10-CM | POA: Diagnosis not present

## 2015-02-10 DIAGNOSIS — M1990 Unspecified osteoarthritis, unspecified site: Secondary | ICD-10-CM | POA: Diagnosis present

## 2015-02-10 DIAGNOSIS — F17290 Nicotine dependence, other tobacco product, uncomplicated: Secondary | ICD-10-CM | POA: Diagnosis present

## 2015-02-10 DIAGNOSIS — I5043 Acute on chronic combined systolic (congestive) and diastolic (congestive) heart failure: Secondary | ICD-10-CM | POA: Diagnosis present

## 2015-02-10 DIAGNOSIS — F17210 Nicotine dependence, cigarettes, uncomplicated: Secondary | ICD-10-CM | POA: Diagnosis present

## 2015-02-10 DIAGNOSIS — J9601 Acute respiratory failure with hypoxia: Secondary | ICD-10-CM | POA: Diagnosis present

## 2015-02-10 DIAGNOSIS — E119 Type 2 diabetes mellitus without complications: Secondary | ICD-10-CM | POA: Diagnosis present

## 2015-02-10 DIAGNOSIS — I255 Ischemic cardiomyopathy: Secondary | ICD-10-CM | POA: Diagnosis present

## 2015-02-10 DIAGNOSIS — J810 Acute pulmonary edema: Secondary | ICD-10-CM | POA: Diagnosis not present

## 2015-02-10 DIAGNOSIS — E785 Hyperlipidemia, unspecified: Secondary | ICD-10-CM | POA: Diagnosis present

## 2015-02-10 DIAGNOSIS — I160 Hypertensive urgency: Secondary | ICD-10-CM | POA: Diagnosis present

## 2015-02-10 DIAGNOSIS — Z720 Tobacco use: Secondary | ICD-10-CM | POA: Diagnosis present

## 2015-02-10 LAB — CBC WITH DIFFERENTIAL/PLATELET
BASOS PCT: 0 % (ref 0–1)
Basophils Absolute: 0 10*3/uL (ref 0.0–0.1)
EOS ABS: 0.1 10*3/uL (ref 0.0–0.7)
EOS PCT: 1 % (ref 0–5)
HEMATOCRIT: 33.1 % — AB (ref 39.0–52.0)
HEMOGLOBIN: 11 g/dL — AB (ref 13.0–17.0)
Lymphocytes Relative: 18 % (ref 12–46)
Lymphs Abs: 1.3 10*3/uL (ref 0.7–4.0)
MCH: 31.3 pg (ref 26.0–34.0)
MCHC: 33.2 g/dL (ref 30.0–36.0)
MCV: 94 fL (ref 78.0–100.0)
MONO ABS: 0.6 10*3/uL (ref 0.1–1.0)
MONOS PCT: 9 % (ref 3–12)
NEUTROS ABS: 5.2 10*3/uL (ref 1.7–7.7)
Neutrophils Relative %: 72 % (ref 43–77)
Platelets: 192 10*3/uL (ref 150–400)
RBC: 3.52 MIL/uL — ABNORMAL LOW (ref 4.22–5.81)
RDW: 14.8 % (ref 11.5–15.5)
WBC: 7.3 10*3/uL (ref 4.0–10.5)

## 2015-02-10 LAB — COMPREHENSIVE METABOLIC PANEL
ALBUMIN: 3.5 g/dL (ref 3.5–5.2)
ALT: 47 U/L (ref 0–53)
ALT: 50 U/L (ref 0–53)
AST: 37 U/L (ref 0–37)
AST: 58 U/L — ABNORMAL HIGH (ref 0–37)
Albumin: 3.5 g/dL (ref 3.5–5.2)
Alkaline Phosphatase: 87 U/L (ref 39–117)
Alkaline Phosphatase: 91 U/L (ref 39–117)
Anion gap: 5 (ref 5–15)
Anion gap: 6 (ref 5–15)
BILIRUBIN TOTAL: 0.6 mg/dL (ref 0.3–1.2)
BUN: 15 mg/dL (ref 6–23)
BUN: 16 mg/dL (ref 6–23)
CHLORIDE: 105 mmol/L (ref 96–112)
CHLORIDE: 106 mmol/L (ref 96–112)
CO2: 26 mmol/L (ref 19–32)
CO2: 29 mmol/L (ref 19–32)
CREATININE: 0.91 mg/dL (ref 0.50–1.35)
Calcium: 8.5 mg/dL (ref 8.4–10.5)
Calcium: 8.6 mg/dL (ref 8.4–10.5)
Creatinine, Ser: 0.94 mg/dL (ref 0.50–1.35)
GFR calc Af Amer: >90 mL/min (ref >90)
GFR calc Af Amer: >90 mL/min (ref >90)
GFR calc non Af Amer: 86 mL/min — ABNORMAL LOW (ref >90)
GFR, EST NON AFRICAN AMERICAN: 85 mL/min — AB (ref >90)
Glucose, Bld: 120 mg/dL — ABNORMAL HIGH (ref 70–99)
Glucose, Bld: 127 mg/dL — ABNORMAL HIGH (ref 70–99)
POTASSIUM: 3.6 mmol/L (ref 3.5–5.1)
Potassium: 4.1 mmol/L (ref 3.5–5.1)
Sodium: 138 mmol/L (ref 135–145)
Sodium: 139 mmol/L (ref 135–145)
TOTAL PROTEIN: 6.5 g/dL (ref 6.0–8.3)
TOTAL PROTEIN: 6.6 g/dL (ref 6.0–8.3)
Total Bilirubin: 1.1 mg/dL (ref 0.3–1.2)

## 2015-02-10 LAB — I-STAT TROPONIN, ED: Troponin i, poc: 0.02 ng/mL (ref 0.00–0.08)

## 2015-02-10 LAB — MAGNESIUM: Magnesium: 1.8 mg/dL (ref 1.5–2.5)

## 2015-02-10 LAB — CBC
HEMATOCRIT: 34.1 % — AB (ref 39.0–52.0)
HEMOGLOBIN: 11.3 g/dL — AB (ref 13.0–17.0)
MCH: 31 pg (ref 26.0–34.0)
MCHC: 33.1 g/dL (ref 30.0–36.0)
MCV: 93.4 fL (ref 78.0–100.0)
Platelets: 189 10*3/uL (ref 150–400)
RBC: 3.65 MIL/uL — AB (ref 4.22–5.81)
RDW: 14.7 % (ref 11.5–15.5)
WBC: 9 10*3/uL (ref 4.0–10.5)

## 2015-02-10 LAB — TROPONIN I
TROPONIN I: 0.04 ng/mL — AB (ref <0.031)
Troponin I: 0.03 ng/mL (ref <0.031)
Troponin I: 0.04 ng/mL — ABNORMAL HIGH (ref <0.031)

## 2015-02-10 LAB — BRAIN NATRIURETIC PEPTIDE: B Natriuretic Peptide: 770.9 pg/mL — ABNORMAL HIGH (ref 0.0–100.0)

## 2015-02-10 LAB — MRSA PCR SCREENING: MRSA by PCR: NEGATIVE

## 2015-02-10 LAB — TSH: TSH: 1.84 u[IU]/mL (ref 0.350–4.500)

## 2015-02-10 MED ORDER — SODIUM CHLORIDE 0.9 % IJ SOLN
3.0000 mL | Freq: Two times a day (BID) | INTRAMUSCULAR | Status: DC
  Administered 2015-02-10 – 2015-02-11 (×3): 3 mL via INTRAVENOUS

## 2015-02-10 MED ORDER — ISOSORBIDE MONONITRATE ER 30 MG PO TB24
30.0000 mg | ORAL_TABLET | Freq: Every day | ORAL | Status: DC
  Administered 2015-02-10 – 2015-02-12 (×3): 30 mg via ORAL
  Filled 2015-02-10 (×4): qty 1

## 2015-02-10 MED ORDER — ALLOPURINOL 300 MG PO TABS
300.0000 mg | ORAL_TABLET | Freq: Every day | ORAL | Status: DC
  Administered 2015-02-10 – 2015-02-12 (×3): 300 mg via ORAL
  Filled 2015-02-10 (×3): qty 1

## 2015-02-10 MED ORDER — COLESEVELAM HCL 625 MG PO TABS
625.0000 mg | ORAL_TABLET | Freq: Two times a day (BID) | ORAL | Status: DC
Start: 2015-02-10 — End: 2015-02-12
  Administered 2015-02-10 – 2015-02-12 (×4): 625 mg via ORAL
  Filled 2015-02-10 (×7): qty 1

## 2015-02-10 MED ORDER — CARVEDILOL 6.25 MG PO TABS
6.2500 mg | ORAL_TABLET | Freq: Two times a day (BID) | ORAL | Status: DC
  Administered 2015-02-10 – 2015-02-11 (×4): 6.25 mg via ORAL
  Filled 2015-02-10 (×7): qty 1

## 2015-02-10 MED ORDER — ENOXAPARIN SODIUM 40 MG/0.4ML ~~LOC~~ SOLN
40.0000 mg | SUBCUTANEOUS | Status: DC
  Administered 2015-02-10 – 2015-02-11 (×2): 40 mg via SUBCUTANEOUS
  Filled 2015-02-10 (×3): qty 0.4

## 2015-02-10 MED ORDER — ACETAMINOPHEN 650 MG RE SUPP: 650.0000 mg | Freq: Four times a day (QID) | RECTAL | Status: DC | PRN

## 2015-02-10 MED ORDER — LORATADINE 10 MG PO TABS
10.0000 mg | ORAL_TABLET | Freq: Every day | ORAL | Status: DC
  Administered 2015-02-10 – 2015-02-12 (×3): 10 mg via ORAL
  Filled 2015-02-10 (×3): qty 1

## 2015-02-10 MED ORDER — CLOPIDOGREL BISULFATE 75 MG PO TABS
75.0000 mg | ORAL_TABLET | Freq: Every day | ORAL | Status: DC
  Administered 2015-02-10 – 2015-02-12 (×3): 75 mg via ORAL
  Filled 2015-02-10 (×3): qty 1

## 2015-02-10 MED ORDER — COLCHICINE 0.6 MG PO TABS
0.6000 mg | ORAL_TABLET | Freq: Every day | ORAL | Status: DC | PRN
  Filled 2015-02-10: qty 1

## 2015-02-10 MED ORDER — ONDANSETRON HCL 4 MG/2ML IJ SOLN: 4.0000 mg | Freq: Four times a day (QID) | INTRAMUSCULAR | Status: DC | PRN

## 2015-02-10 MED ORDER — FUROSEMIDE 10 MG/ML IJ SOLN
60.0000 mg | Freq: Two times a day (BID) | INTRAMUSCULAR | Status: DC
  Administered 2015-02-10 – 2015-02-11 (×3): 60 mg via INTRAVENOUS
  Filled 2015-02-10 (×4): qty 6

## 2015-02-10 MED ORDER — POTASSIUM CHLORIDE CRYS ER 20 MEQ PO TBCR
20.0000 meq | EXTENDED_RELEASE_TABLET | Freq: Every day | ORAL | Status: DC
  Administered 2015-02-10 – 2015-02-12 (×3): 20 meq via ORAL
  Filled 2015-02-10 (×3): qty 1

## 2015-02-10 MED ORDER — ACETAMINOPHEN 325 MG PO TABS: 650.0000 mg | ORAL_TABLET | Freq: Four times a day (QID) | ORAL | Status: DC | PRN

## 2015-02-10 MED ORDER — LISINOPRIL 20 MG PO TABS
20.0000 mg | ORAL_TABLET | Freq: Every morning | ORAL | Status: DC
  Administered 2015-02-10 – 2015-02-12 (×3): 20 mg via ORAL
  Filled 2015-02-10 (×3): qty 1

## 2015-02-10 MED ORDER — ASPIRIN EC 81 MG PO TBEC
81.0000 mg | DELAYED_RELEASE_TABLET | Freq: Every day | ORAL | Status: DC
  Administered 2015-02-10 – 2015-02-12 (×3): 81 mg via ORAL
  Filled 2015-02-10 (×3): qty 1

## 2015-02-10 MED ORDER — ONDANSETRON HCL 4 MG PO TABS: 4.0000 mg | ORAL_TABLET | Freq: Four times a day (QID) | ORAL | Status: DC | PRN

## 2015-02-10 MED ORDER — FLUTICASONE PROPIONATE 50 MCG/ACT NA SUSP: 2.0000 | Freq: Every day | NASAL | Status: DC | PRN

## 2015-02-10 NOTE — ED Notes (Signed)
MD at bedside, Dr. Hal Hope.

## 2015-02-10 NOTE — Progress Notes (Signed)
Report given and transferred to 2W 30

## 2015-02-10 NOTE — Care Management Note (Addendum)
    Page 1 of 1   02/12/2015     11:37:57 AM CARE MANAGEMENT NOTE 02/12/2015  Patient:  Kenneth [name], Kenneth [name]   Account Number:  [id number]  Date Initiated:  02/10/2015  Documentation initiated by:  Elissa Hefty  Subjective/Objective Assessment:   adm w flash pul edema     Action/Plan:   lives alone, pcp dr Velna Hatchet   Anticipated DC Date:  02/12/2015   Anticipated DC Plan:  [place]         Choice offered to / List presented to:             Status of service:  Completed, signed off Medicare Important Message given?  YES (If response is "NO", the following Medicare IM given date fields will be blank) Date Medicare IM given:  02/12/2015 Medicare IM given by:  Marvetta Gibbons Date Additional Medicare IM given:   Additional Medicare IM given by:    Discharge Disposition:  HOME/SELF CARE  Per UR Regulation:  Reviewed for med. necessity/level of care/duration of stay  If discussed at [place] of Stay Meetings, dates discussed:    Comments:

## 2015-02-10 NOTE — H&P (Addendum)
[facility] History and Physical  Kenneth [name] OBS:[id number] DOB: Dec 18, 1945 DOA: 02/09/2015  Referring physician: ER physician. Dr. Reather Converse. PCP: Velna Hatchet, MD  Specialists: Dr. Martinique. Cardiologist.  Chief Complaint: Shortness of breath.  HPI: Kenneth [name] is a 69 y.o. male with history of systolic heart failure, CAD status post stenting who has had recent cardiac cath last month presents to the ER because of sudden onset of shortness of breath. Patient stated that he was doing fine and went to bed and suddenly got short of breath last night around 10 PM. Denies any chest pain. Patient also was having some diaphoresis. Patient was brought to the ER by EMS and initially was placed on C Pap. Patient's blood pressure was initially found to be more than 21 systolic and patient was placed on nitroglycerin infusion followed by Lasix 80 mg IV was given. Following which patient is respiratory status improved and patient was weaned off C Pap. Patient's recent 2-D echo done last month also showed moderate to severe mitral regurgitation. Patient has been admitted for further management. Patient states he has been compliant with his medications. Patient otherwise denies any nausea vomiting abdominal pain fever chills.   Review of Systems: As presented in the history of presenting illness, rest negative.  Past Medical History  Diagnosis Date  . Hypertension   . Hyperlipidemia   . Tobacco abuse   . Coronary artery disease     a. 1999: s/p PTCA;  b. 11/2012 Cath/PCI: LM mild plaque, LAD 60-31m, LCX 30p, 69m, OM1 67m, RCA 30-40ost, 40-35m, 80-40m/d (3.0x38, 3.0x32, 3.0x24 Promus Premier DESs), 70-80d, EF 40% inf HK;  c. 12/2012 Staged PCI of LAD w/ 3.0x12 Promus Premier DES.   Marland Kitchen Acute on chronic combined systolic and diastolic CHF (congestive heart failure) 09/18/2013  . Squamous cell carcinoma     Back  . Myocardial infarction     '99- MI,"angio[plasty", Stents 3'14  . H/O seasonal  allergies   . Diabetes mellitus without complication     type 2 , no oral meds in 2 yrs.     . Elevated uric acid in blood   . Arthritis     osteoarthritis-knees, hands, wrist, fingers   Past Surgical History  Procedure Laterality Date  . Coronary angioplasty  1999  . Coronary angioplasty with stent placement  01/14/2013    DES to LAD    Dr Peter Martinique  . Hernia repair  2004 ?  Marland Kitchen Carpal tunnel release Left   . Ankle surgery  1960  . Total knee arthroplasty Right 06/08/2014    Procedure: RIGHT TOTAL KNEE ARTHROPLASTY;  Surgeon: Gearlean Alf, MD;  Location: [facility] ORS;  Service: Orthopedics;  Laterality: Right;  . Left heart catheterization with coronary angiogram N/A 12/27/2012    Procedure: LEFT HEART CATHETERIZATION WITH CORONARY ANGIOGRAM;  Surgeon: Jolaine Artist, MD;  Location: [facility] CATH LAB;  Service: Cardiovascular;  Laterality: N/A;  . Percutaneous coronary stent intervention (pci-s) N/A 12/30/2012    Procedure: PERCUTANEOUS CORONARY STENT INTERVENTION (PCI-S);  Surgeon: Peter M Martinique, MD;  Location: [facility] CATH LAB;  Service: Cardiovascular;  Laterality: N/A;  . Percutaneous coronary stent intervention (pci-s) N/A 01/14/2013    Procedure: PERCUTANEOUS CORONARY STENT INTERVENTION (PCI-S);  Surgeon: Peter M Martinique, MD;  Location: [facility] CATH LAB;  Service: Cardiovascular;  Laterality: N/A;  . Left and right heart catheterization with coronary angiogram N/A 01/12/2015    Procedure: LEFT AND RIGHT HEART CATHETERIZATION WITH CORONARY ANGIOGRAM;  Surgeon: Peter M Martinique, MD;  Location: [facility] CATH LAB;  Service: Cardiovascular;  Laterality: N/A;   Social History:  reports that he has been smoking Cigars.  He has never used smokeless tobacco. He reports that he drinks about 3.6 oz of alcohol per week. He reports that he does not use illicit drugs. Where does patient live home. Can patient participate in ADLs? Yes.  Allergies  Allergen Reactions  . Crestor [Rosuvastatin]     Muscle aches at Crestor  5 mg daily  . Lipitor [Atorvastatin]     Muscle aches at 20 mg daily   . Pravastatin Sodium     Muscle aches at 20 mg and 40 mg daily  . Zocor [Simvastatin-High Dose]     Muscle aches at 20 mg and 40 mg daily    Family History:  Family History  Problem Relation Age of Onset  . Arrhythmia Sister     s/p PPM  . Alzheimer's disease Mother   . Hypertension Mother   . Diabetes Mother   . CVA Father       Prior to Admission medications   Medication Sig Start Date End Date Taking? Authorizing Provider  allopurinol (ZYLOPRIM) 300 MG tablet Take 300 mg by mouth daily.    Historical Provider, MD  aspirin EC 81 MG tablet Take 81 mg by mouth daily.    Historical Provider, MD  carvedilol (COREG) 6.25 MG tablet Take 1 tablet (6.25 mg total) by mouth 2 (two) times daily. 10/16/14   Peter M Martinique, MD  cetirizine (ZYRTEC) 10 MG tablet Take 10 mg by mouth daily as needed for allergies.     Historical Provider, MD  clopidogrel (PLAVIX) 75 MG tablet Take 1 tablet by mouth daily. 07/04/14   Historical Provider, MD  colchicine 0.6 MG tablet Take 0.6 mg by mouth daily as needed (Gout).    Historical Provider, MD  colesevelam (WELCHOL) 625 MG tablet Take 1 tablet (625 mg total) by mouth 2 (two) times daily with a meal. 01/10/15   Brett Canales, PA-C  fluticasone (FLONASE) 50 MCG/ACT nasal spray Place 2 sprays into the nose daily as needed for allergies.  12/31/12   Ripudeep Krystal Eaton, MD  furosemide (LASIX) 40 MG tablet Take 1 tablet (40 mg total) by mouth daily. 01/12/15   Peter M Martinique, MD  isosorbide mononitrate (IMDUR) 30 MG 24 hr tablet Take 1 tablet (30 mg total) by mouth daily. 01/06/15   Isaiah Serge, NP  lisinopril (PRINIVIL,ZESTRIL) 40 MG tablet Take 20 mg by mouth every morning.    Historical Provider, MD  Multiple Vitamin (MULTIVITAMIN) tablet Take 1 tablet by mouth daily.    Historical Provider, MD  nitroGLYCERIN (NITROSTAT) 0.4 MG SL tablet Place 1 tablet (0.4 mg total) under the tongue every 5  (five) minutes x 3 doses as needed for chest pain. 01/10/15   Erlene Quan, PA-C  potassium chloride 20 MEQ TBCR Take 20 mEq by mouth daily as needed. Patient taking differently: Take 20 mEq by mouth daily as needed. 1/2 TABLET DAILY (STARTING 01/26/15) 01/10/15   Brett Canales, PA-C  Probiotic Product (PROBIOTIC DAILY PO) Take 1 capsule by mouth daily.    Historical Provider, MD  tetrahydrozoline 0.05 % ophthalmic solution Place 1 drop into both eyes 2 (two) times daily as needed (Itchy eyes).    Historical Provider, MD    Physical Exam: Filed Vitals:   02/10/15 0315 02/10/15 0330 02/10/15 0345 02/10/15 0400  BP: 127/77 119/69 123/72 121/64  Pulse: 65 68  61 63  Temp:      TempSrc:      Resp: 19 18 14 16   SpO2: 98% 98% 98% 97%     General:  Well-developed and nourished.  Eyes: Anicteric no pallor.  ENT: No discharge from ears eyes nose and mouth.  Neck: JVD is elevated. No mass felt.  Cardiovascular: S1-S2 heard.  Respiratory: No rhonchi or crepitations.  Abdomen: Soft nontender bowel sounds present.  Skin: No rash.  Musculoskeletal: Mild edema.  Psychiatric: Appears normal.  Neurologic: Alert awake oriented to time place and person. Moves all extremities.  Labs on Admission:  Basic Metabolic Panel:  Recent Labs Lab 02/09/15 2355  NA 138  K 4.1  CL 106  CO2 26  GLUCOSE 127*  BUN 15  CREATININE 0.91  CALCIUM 8.5   Liver Function Tests:  Recent Labs Lab 02/09/15 2355  AST 58*  ALT 50  ALKPHOS 91  BILITOT 0.6  PROT 6.6  ALBUMIN 3.5   No results for input(s): LIPASE, AMYLASE in the last 168 hours. No results for input(s): AMMONIA in the last 168 hours. CBC:  Recent Labs Lab 02/09/15 2355  WBC 9.0  HGB 11.3*  HCT 34.1*  MCV 93.4  PLT 189   Cardiac Enzymes: No results for input(s): CKTOTAL, CKMB, CKMBINDEX, TROPONINI in the last 168 hours.  BNP (last 3 results)  Recent Labs  02/09/15 2335  BNP 770.9*    ProBNP (last 3 results) No  results for input(s): PROBNP in the last 8760 hours.  CBG: No results for input(s): GLUCAP in the last 168 hours.  Radiological Exams on Admission: Dg Chest Portable 1 View  02/10/2015   CLINICAL DATA:  Shortness of breath on-and off for one day.  EXAM: PORTABLE CHEST - 1 VIEW  COMPARISON:  Frontal and lateral views 01/08/2015  FINDINGS: The heart is enlarged. There is progressive vascular congestion. Question mild pulmonary edema. Blunting of both costophrenic angles. Ill-defined bibasilar opacities, likely atelectasis. No pneumothorax.  IMPRESSION: Progressive cardiomegaly, vascular congestion and development mild pulmonary edema. Findings suggest CHF.   Electronically Signed   By: Jeb Levering M.D.   On: 02/10/2015 00:46    EKG: Independently reviewed. Normal sinus rhythm.  Assessment/Plan Principal Problem:   Acute respiratory failure with hypoxia Active Problems:   Hyperlipidemia   CAD S/P percutaneous coronary angioplasty   Acute on chronic combined systolic and diastolic CHF (congestive heart failure)   Hypertensive urgency   1. Acute respiratory failure with hypoxia secondary to decompensated CHF - patient's last EF measured last month was 35-40% through cardiac cath. Patient's recent 2-D echo done last month showed moderate to severe mitral regurgitation. Cardiac cath showed nonobstructive CAD. At this time patient is still on nitroglycerin infusion at 50 mics which will be slowly tapered off once patient takes patient's oral antihypertensives. Patient did receive Lasix 80 mg IV in the ER and I have placed patient on 60 mg IV every 12 hourly. Closely follow her intake output metabolic panel and daily weights. Dietitian consult. Cycle cardiac markers. Patient is on lisinopril. 2. Hypertensive urgency - patient states he has been compliant with his medications. At this time patient is on nitroglycerin infusion. Once patient starts home medications can slowly wean off medicine  infusion. Patient is also on IV Lasix. See #1. 3. CAD status post stenting with recent cardiac cath done last month showing nonobstructive CAD - denies any chest pain. Patient was diaphoretic. Cycle cardiac markers. Continue antiplatelet agents beta blockers. 4. Hyperlipidemia -  continue present medications. 5. History of gout - continue present medications. 6. Mild anemia - follow CBC.  I have reviewed patient's recent cardiac catheter reports, cardiology notes and personally reviewed chest x-ray and EKG.   DVT Prophylaxis Lovenox.  Code Status: Full code.  Family Communication: None.  Disposition Plan: Admit to inpatient.    KAKRAKANDY,ARSHAD N. [facility] Pager [phone].  If 7PM-7AM, please contact night-coverage [email or web address] Password [facility] 02/10/2015, 4:48 AM

## 2015-02-10 NOTE — Plan of Care (Addendum)
Problem: Food- and Nutrition-Related Knowledge Deficit (NB-1.1) Goal: Nutrition education Formal process to instruct or train a patient/client in a skill or to impart knowledge to help patients/clients voluntarily manage or modify food choices and eating behavior to maintain or improve health. Outcome: Adequate for Discharge Nutrition Education Note  RD consulted for nutrition education regarding a Heart Healthy diet.   Lipid Panel     Component Value Date/Time    CHOL 188 01/09/2015 0910    TRIG 199* 01/09/2015 0910    HDL 31* 01/09/2015 0910    CHOLHDL 6.1 01/09/2015 0910    VLDL 40 01/09/2015 0910    LDLCALC 117* 01/09/2015 0910   Spoke with pt who reports intentional weight loss due to increased activity since knee surgery and being more aware of satiety cues. He has also made great efforts in decreasing the sodium from his diet, including avoiding fast food restaurants, decreasing condiments and reading food labels. He reveals that he reads food labels and often does not choose food items that contain more than 20% of the daily value of sodium. He does not add salt to food while cooking or at the table. He is now choosing lean proteins and prepares most of his meals on the grill. When he does indulge, he is vigilant about portion sizes (for example, he orders one slice of pizza when he goes out weekly). He admits to consuming [employer] and RD provided suggestions for lower sodium convenience foods. He expresses frustration with continued fluid retention, stating "there's something in my heart that won't remove fluid". He confirms compliance with diuretic regimen.   RD provided "Heart Healthy Nutrition Therapy" handout from the Academy of Nutrition and Dietetics. Reviewed patient's dietary recall. Provided examples on ways to decrease sodium and fat intake in diet. Discouraged intake of processed foods and use of salt shaker. Encouraged fresh fruits and vegetables as well as whole grain  sources of carbohydrates to maximize fiber intake. Teach back method used.  Expect good compliance.  Estimated body mass index is 32.62 kg/(m^2) as calculated from the following:   Height as of 01/25/15: 5\' 8"  (1.727 m).   Weight as of 01/25/15: 214 lb 8 oz (97.297 kg). Pt meets criteria for obesity, class I based on current BMI.  Current diet order is Heart Healthy with 1200 ml fluid restriction,  patient is consuming approximately 75% of meals at this time. Labs and medications reviewed. No further nutrition interventions warranted at this time. RD contact information provided. If additional nutrition issues arise, please re-consult RD.  Olyn Landstrom A. Jimmye Norman, RD, LDN, CDE Pager: [phone] After hours Pager: [phone]

## 2015-02-10 NOTE — Consult Note (Signed)
CARDIOLOGY CONSULT NOTE  Patient ID: Kenneth [name] MRN: [id number] DOB/AGE: [birth year] 69 y.o.  Admit date: 02/09/2015 Primary Physician: Dr Ardeth Perfect Primary Cardiologist: Dr Martinique Reason for Consultation: CHF  HPI: 69 yo with history of CAD s/p PCI (last in 2/14), ischemic cardiomyopathy, and ischemic MR was admitted last night with severe dyspnea.  Patient had developed exertional dyspnea earlier this year.  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  He saw PCP and Lasix was increased to 40 mg bid.  He then had right and left heart cath in 3/16 showing nonobstructive CAD (patent stents) and normal filling pressures.  MR looked mild to moderate by LV-gram.  Lasix was then decreased to 40 mg daily.  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  He would note dyspnea walking up hills when playing golf.  He has been taking all his medications.  Last night, he ate a can of tomato soup then woke up from sleep around 10:30 pm with severe shortness of breath.  He was gasping for air and diaphoretic.  Some chest tightness but no prominent.  He called for EMS.  BP was very high initially, SBP > 200.   He was started on NTG gtt.  CXR with pulmonary edema.  He was given 80 mg IV Lasix.  Currently, he feels much better.  Breathing back to normal. ECG with no acute changes.    Review of systems complete and found to be negative unless listed above in HPI  Past Medical History: 1. CAD: PTCA 1999.  Unstable angina 2/14 with 3 DES to RCA and DES to LAD.  LHC/RHC (3/16) with nonobstructive CAD (patent stents); mean RA 4, PA 25/11, mean PCWP 9, CI 2.6, EF 35-40%, mild to moderate MR.  2. Mitral regurgitation: Moderate to severe ischemic MR by 3/16 echo, mild to moderate MR by 3/16 LV-gram.  3. Ischemic cardiomyopathy: Echo (3/16) with EF 45% with wall motion abnormalities, moderate LVH, ischemic MR with eccentric  moderate-severe MR.  EF 35-40% by LV-gram 3/16.  4. Gout 5. Hyperlipidemia: Myalgias with multiple statins.  6. HTN 7. Smoker 8. Diabetes: Medical management.  9. Right TKR  Family History  Problem Relation Age of Onset  . Arrhythmia Sister     s/p PPM  . Alzheimer's disease Mother   . Hypertension Mother   . Diabetes Mother   . CVA Father     History   Social History  . Marital Status: Divorced    Spouse Name: N/A  . Number of Children: 3  . Years of Education: N/A   Occupational History  . Custodian    Social History Main Topics  . Smoking status: Light Tobacco Smoker -- 15 years    Types: Cigars    Last Attempt to Quit: 09/02/2013  . Smokeless tobacco: Never Used     Comment: rare ciagar on golf days-variable days 1-2 per month  . Alcohol Use: 3.6 oz/week    6 Cans of beer per week     Comment: 6-pack of beer/weekend  . Drug Use: No  . Sexual Activity: Not Currently   Other Topics Concern  . Not on file   Social History Narrative   Veteran. Receives care through the [facility].      Prescriptions prior to admission  Medication Sig Dispense Refill Last Dose  . allopurinol (ZYLOPRIM) 300 MG tablet Take 300 mg  by mouth daily.   02/09/2015 at Unknown time  . aspirin EC 81 MG tablet Take 81 mg by mouth daily.   02/09/2015 at Unknown time  . carvedilol (COREG) 6.25 MG tablet Take 1 tablet (6.25 mg total) by mouth 2 (two) times daily. 180 tablet 3 02/09/2015 at 1800  . cetirizine (ZYRTEC) 10 MG tablet Take 10 mg by mouth daily as needed for allergies.    02/09/2015 at Unknown time  . clopidogrel (PLAVIX) 75 MG tablet Take 1 tablet by mouth daily.   02/09/2015 at Unknown time  . colchicine 0.6 MG tablet Take 0.6 mg by mouth daily as needed (Gout).   Past Month at Unknown time  . colesevelam (WELCHOL) 625 MG tablet Take 1 tablet (625 mg total) by mouth 2 (two) times daily with a meal. 30 tablet 5 02/09/2015 at Unknown time  . fluticasone (FLONASE) 50 MCG/ACT nasal spray  Place 2 sprays into the nose daily as needed for allergies.    02/09/2015 at Unknown time  . furosemide (LASIX) 40 MG tablet Take 1 tablet (40 mg total) by mouth daily. 30 tablet  02/09/2015 at Unknown time  . isosorbide mononitrate (IMDUR) 30 MG 24 hr tablet Take 1 tablet (30 mg total) by mouth daily. 30 tablet 5 02/09/2015 at Unknown time  . lisinopril (PRINIVIL,ZESTRIL) 40 MG tablet Take 20 mg by mouth every morning.   02/09/2015 at Unknown time  . Multiple Vitamin (MULTIVITAMIN) tablet Take 1 tablet by mouth daily.   02/09/2015 at Unknown time  . nitroGLYCERIN (NITROSTAT) 0.4 MG SL tablet Place 1 tablet (0.4 mg total) under the tongue every 5 (five) minutes x 3 doses as needed for chest pain. 25 tablet 2 Past Month at Unknown time  . potassium chloride 20 MEQ TBCR Take 20 mEq by mouth daily as needed. (Patient taking differently: Take 10 mEq by mouth daily. ) 30 tablet 5 02/09/2015 at Unknown time  . Probiotic Product (PROBIOTIC DAILY PO) Take 1 capsule by mouth daily.   Past Month at Unknown time  . tetrahydrozoline 0.05 % ophthalmic solution Place 1 drop into both eyes 2 (two) times daily as needed (Itchy eyes).   Past Month at Unknown time   Current Scheduled Meds: . allopurinol  300 mg Oral Daily  . aspirin EC  81 mg Oral Daily  . carvedilol  6.25 mg Oral BID  . clopidogrel  75 mg Oral Daily  . colesevelam  625 mg Oral BID WC  . enoxaparin (LOVENOX) injection  40 mg Subcutaneous Q24H  . furosemide  60 mg Intravenous Q12H  . isosorbide mononitrate  30 mg Oral Daily  . lisinopril  20 mg Oral q morning - 10a  . loratadine  10 mg Oral Daily  . potassium chloride  20 mEq Oral Daily  . sodium chloride  3 mL Intravenous Q12H   Continuous Infusions: . nitroGLYCERIN 50 mcg/min (02/10/15 0010)   PRN Meds:.acetaminophen **OR** acetaminophen, colchicine, fluticasone, ondansetron **OR** ondansetron (ZOFRAN) IV   Physical exam Blood pressure 124/84, pulse 63, temperature 97.9 F (36.6 C),  temperature source Oral, resp. rate 16, SpO2 97 %. General: NAD Neck: JVP 8 cm, no thyromegaly or thyroid nodule.  Lungs: Slight crackles at bases bilaterally.  CV: Nondisplaced PMI.  Heart regular S1/S2, no S3/S4,1/6 HSM apex.  1+ ankle edema.  No carotid bruit.  Normal pedal pulses.  Abdomen: Soft, nontender, no hepatosplenomegaly, no distention.  Skin: Intact without lesions or rashes.  Neurologic: Alert and oriented x 3.  Psych: Normal affect. Extremities: No clubbing or cyanosis.  HEENT: Normal.   Labs:   Lab Results  Component Value Date   WBC 7.3 02/10/2015   HGB 11.0* 02/10/2015   HCT 33.1* 02/10/2015   MCV 94.0 02/10/2015   PLT 192 02/10/2015    Recent Labs Lab 02/10/15 0900  NA 139  K 3.6  CL 105  CO2 29  BUN 16  CREATININE 0.94  CALCIUM 8.6  PROT 6.5  BILITOT 1.1  ALKPHOS 87  ALT 47  AST 37  GLUCOSE 120*   Lab Results  Component Value Date   CKTOTAL 54 01/12/2015   CKMB 2.0 01/12/2015   TROPONINI 0.03 02/10/2015    Radiology:  - CXR: Mild pulmonary edema  EKG: NSR, no acute changes  ASSESSMENT AND PLAN: 69 yo with history of CAD s/p PCI (last in 2/14), ischemic cardiomyopathy, and ischemic MR was admitted last night with severe dyspnea.  1. Acute on chronic systolic CHF: Ischemic cardiomyopathy, EF 45% by echo, 35-40% by LV-gram.  He had severe PND symptoms last night, had actually been doing well during the day prior and worked without issues.  Suggests flash pulmonary edema picture.  He did have a sodium load with a can of tomato soup for supper.  He has been taking all his meds, so suspect the elevated BP was a stress response to the dyspnea.  Currently, volume does not look that bad on exam.  - Continue NTG gtt for now, can titrate off to home meds (ordered and have been given).  - Give IV Lasix today, likely can transition back to po tomorrow.  Would use Lasix 40 mg bid (increase from 40 daily at admission => RHC numbers looked good recently on 40  mg bid regimen).  - Needs education on low sodium diet.  2. CAD: I do not think this is ACS.  Initial troponin negative, ECG unremarkable.  - Continue ASA 81 and Plavix.  Does not tolerate statins. - Cycle troponin.  3. Mitral regurgitation: I reviewed his prior echo.  The MR was eccentric and likely ischemic (related to wall motion abnormalities with posterior leaflet tethering).  Rated moderate to severe with PISA ERO 0.39 cm^2.  I suspect that the MR actually is moderate based on the recent cath.  He does not have a prominent murmur on exam.  It is possible that rise in BP (such as with pulmonary edema last night) can make the MR worse and potentiate CHF.  I will repeat a limited echo to relook specifically at MV.   Loralie Champagne 02/10/2015 11:29 AM

## 2015-02-10 NOTE — Progress Notes (Addendum)
PROGRESS NOTE  Kenneth [name] ZOX:[id number] DOB: January 06, 1946 DOA: 02/09/2015 PCP: Velna Hatchet, MD  HPI/Recap of past 24 hours: \Patient admitted early morning of 4/13 for acute respiratory failure secondary to CHF. Patient had had a recent hospitalization for this just last month. At that time echocardiogram noted diastolic heart failure but also moderate to severe mitral regurg. Patient required initial BiPAP and placed in stepdown unit.  Seen later this morning and following diuresis, doing much better.  Assessment/Plan: Principal Problem:   Acute respiratory failure with hypoxia secondary to acute on chronic diastolic CHF/severe mitral regurg: Diuresing. We'll wean off nitroglycerin drip and transferred to floor. Plan is to to recheck echocardiogram to see if valvular dysfunction worsening Active Problems:   Hyperlipidemia: Stable   CAD S/P percutaneous coronary angioplasty: Overall stable    Hypertensive urgency: With diuresis, pressures improved.   Tobacco abuse: Still smokes cigars  History of glucose intolerance: Last A1c several years ago. Recheck.  Obese: Patient meets criteria with BMI greater than 30  Code Status: Full code  Family Communication: Left message with family  Disposition Plan: Stable respiratory status wise, we'll transfer to floor.  Consultants:  Cardiology  Procedures:  Echocardiogram pending  Antibiotics:  9   Objective: BP 126/61 mmHg  Pulse 68  Temp(Src) 98.8 F (37.1 C) (Oral)  Resp 15  Ht 5\' 8"  (1.727 m)  Wt 97.07 kg (214 lb)  BMI 32.55 kg/m2  SpO2 97%  Intake/Output Summary (Last 24 hours) at 02/10/15 1625 Last data filed at 02/10/15 1600  Gross per 24 hour  Intake  382.5 ml  Output   3600 ml  Net -3217.5 ml   Filed Weights   02/10/15 0800  Weight: 97.07 kg (214 lb)    Exam:   General:  alert and oriented 3, no acute distress  Cardiovascular: regular rate and rhythm, W1-X9, 2/6 systolic ejection  murmur  Respiratory: decreased breath sounds bilaterally  Abdomen: soft, nontender, nondistended, positive bowel sounds  Musculoskeletal: no clubbing or cyanosis or edema   Data Reviewed: Basic Metabolic Panel:  Recent Labs Lab 02/09/15 2355 02/10/15 0900  NA 138 139  K 4.1 3.6  CL 106 105  CO2 26 29  GLUCOSE 127* 120*  BUN 15 16  CREATININE 0.91 0.94  CALCIUM 8.5 8.6  MG  --  1.8   Liver Function Tests:  Recent Labs Lab 02/09/15 2355 02/10/15 0900  AST 58* 37  ALT 50 47  ALKPHOS 91 87  BILITOT 0.6 1.1  PROT 6.6 6.5  ALBUMIN 3.5 3.5   No results for input(s): LIPASE, AMYLASE in the last 168 hours. No results for input(s): AMMONIA in the last 168 hours. CBC:  Recent Labs Lab 02/09/15 2355 02/10/15 0900  WBC 9.0 7.3  NEUTROABS  --  5.2  HGB 11.3* 11.0*  HCT 34.1* 33.1*  MCV 93.4 94.0  PLT 189 192   Cardiac Enzymes:    Recent Labs Lab 02/10/15 0900 02/10/15 1322  TROPONINI 0.03 0.04*   BNP (last 3 results)  Recent Labs  02/09/15 2335  BNP 770.9*    ProBNP (last 3 results) No results for input(s): PROBNP in the last 8760 hours.  CBG: No results for input(s): GLUCAP in the last 168 hours.  Recent Results (from the past 240 hour(s))  MRSA PCR Screening     Status: None   Collection Time: 02/10/15  9:38 AM  Result Value Ref Range Status   MRSA by PCR NEGATIVE NEGATIVE Final    Comment:  The GeneXpert MRSA Assay (FDA approved for NASAL specimens only), is one component of a comprehensive MRSA colonization surveillance program. It is not intended to diagnose MRSA infection nor to guide or monitor treatment for MRSA infections.      Studies: No results found.  Scheduled Meds: . allopurinol  300 mg Oral Daily  . aspirin EC  81 mg Oral Daily  . carvedilol  6.25 mg Oral BID  . clopidogrel  75 mg Oral Daily  . colesevelam  625 mg Oral BID WC  . enoxaparin (LOVENOX) injection  40 mg Subcutaneous Q24H  . furosemide  60 mg  Intravenous Q12H  . isosorbide mononitrate  30 mg Oral Daily  . lisinopril  20 mg Oral q morning - 10a  . loratadine  10 mg Oral Daily  . potassium chloride  20 mEq Oral Daily  . sodium chloride  3 mL Intravenous Q12H    Continuous Infusions:    Time spent: 25 min  [place] [facility] Pager [phone]. If 7PM-7AM, please contact night-coverage at [email or web address], password [facility] 02/10/2015, 4:25 PM  LOS: 0 days

## 2015-02-10 NOTE — Progress Notes (Signed)
Pt arrived by EMS on CPAP. Pt was transferred over to out BIPAP per MD request. Pt was placed on 12/5 100%. Pt remained on BIPAP for 20 minutes and complained he could not get his breath. Setting were gradually decreased to 8/5 to help patient and tolerate and patient stated he felt he was not getting anything. Pt was pulling volumes between 1.4 L - 2 L. After addressing MD with this we decided to remove BIPAP. Pt was placed on 2L [place]. Vitals: HR: 75; RR 22, Sats: 98%. Pt states he is comfortable at this time.

## 2015-02-10 NOTE — ED Notes (Signed)
I provided the patient with his glasses.  He wanted them so he could read his phone.

## 2015-02-11 DIAGNOSIS — E669 Obesity, unspecified: Secondary | ICD-10-CM

## 2015-02-11 DIAGNOSIS — I340 Nonrheumatic mitral (valve) insufficiency: Secondary | ICD-10-CM

## 2015-02-11 DIAGNOSIS — J810 Acute pulmonary edema: Secondary | ICD-10-CM

## 2015-02-11 LAB — BASIC METABOLIC PANEL
Anion gap: 10 (ref 5–15)
BUN: 13 mg/dL (ref 6–23)
CALCIUM: 8.7 mg/dL (ref 8.4–10.5)
CHLORIDE: 100 mmol/L (ref 96–112)
CO2: 29 mmol/L (ref 19–32)
CREATININE: 0.9 mg/dL (ref 0.50–1.35)
GFR calc Af Amer: >90 mL/min (ref >90)
GFR calc non Af Amer: 87 mL/min — ABNORMAL LOW (ref >90)
GLUCOSE: 107 mg/dL — AB (ref 70–99)
Potassium: 3.4 mmol/L — ABNORMAL LOW (ref 3.5–5.1)
Sodium: 139 mmol/L (ref 135–145)

## 2015-02-11 MED ORDER — FUROSEMIDE 10 MG/ML IJ SOLN
40.0000 mg | Freq: Two times a day (BID) | INTRAMUSCULAR | Status: DC
  Administered 2015-02-11 – 2015-02-12 (×2): 40 mg via INTRAVENOUS
  Filled 2015-02-11 (×3): qty 4

## 2015-02-11 NOTE — Progress Notes (Addendum)
Patient Profile: 69 yo with history of CAD s/p PCI (last in 2/14), ischemic cardiomyopathy, and ischemic MR was admitted last night with severe dyspnea.   Subjective: Breathing improved. No further dyspnea. Denies CP.   Objective: Vital signs in last 24 hours: Temp:  [98.1 F (36.7 C)-98.8 F (37.1 C)] 98.5 F (36.9 C) (04/14 0538) Pulse Rate:  [64-68] 65 (04/14 0538) Resp:  [15-18] 18 (04/14 0538) BP: (122-150)/(61-81) 134/79 mmHg (04/14 0538) SpO2:  [95 %-99 %] 95 % (04/14 0538) Weight:  [198 lb 1.6 oz (89.858 kg)] 198 lb 1.6 oz (89.858 kg) (04/14 0500)    Intake/Output from previous day: 04/13 0701 - 04/14 0700 In: 649.3 [P.O.:460; I.V.:189.3] Out: 5200 [Urine:5200] Intake/Output this shift:    Medications Current Facility-Administered Medications  Medication Dose Route Frequency Provider Last Rate Last Dose  . acetaminophen (TYLENOL) tablet 650 mg  650 mg Oral Q6H PRN Rise Patience, MD       Or  . acetaminophen (TYLENOL) suppository 650 mg  650 mg Rectal Q6H PRN Rise Patience, MD      . allopurinol (ZYLOPRIM) tablet 300 mg  300 mg Oral Daily Rise Patience, MD   300 mg at 02/11/15 1055  . aspirin EC tablet 81 mg  81 mg Oral Daily Rise Patience, MD   81 mg at 02/11/15 1055  . carvedilol (COREG) tablet 6.25 mg  6.25 mg Oral BID Rise Patience, MD   6.25 mg at 02/11/15 1055  . clopidogrel (PLAVIX) tablet 75 mg  75 mg Oral Daily Rise Patience, MD   75 mg at 02/11/15 1056  . colchicine tablet 0.6 mg  0.6 mg Oral Daily PRN Rise Patience, MD      . colesevelam [facility]) tablet 625 mg  625 mg Oral BID WC Rise Patience, MD   625 mg at 02/10/15 1753  . enoxaparin (LOVENOX) injection 40 mg  40 mg Subcutaneous Q24H Rise Patience, MD   40 mg at 02/11/15 1056  . fluticasone (FLONASE) 50 MCG/ACT nasal spray 2 spray  2 spray Each Nare Daily PRN Rise Patience, MD      . furosemide (LASIX) injection 60 mg  60 mg Intravenous Q12H  Rise Patience, MD   60 mg at 02/11/15 1055  . isosorbide mononitrate (IMDUR) 24 hr tablet 30 mg  30 mg Oral Daily Rise Patience, MD   30 mg at 02/11/15 1055  . lisinopril (PRINIVIL,ZESTRIL) tablet 20 mg  20 mg Oral q morning - 10a Rise Patience, MD   20 mg at 02/11/15 1055  . loratadine (CLARITIN) tablet 10 mg  10 mg Oral Daily Rise Patience, MD   10 mg at 02/11/15 1055  . ondansetron (ZOFRAN) tablet 4 mg  4 mg Oral Q6H PRN Rise Patience, MD       Or  . ondansetron [facility]) injection 4 mg  4 mg Intravenous Q6H PRN Rise Patience, MD      . potassium chloride SA (K-DUR,KLOR-CON) CR tablet 20 mEq  20 mEq Oral Daily Rise Patience, MD   20 mEq at 02/11/15 1055  . sodium chloride 0.9 % injection 3 mL  3 mL Intravenous Q12H Rise Patience, MD   3 mL at 02/11/15 1056    PE: General appearance: alert, cooperative and no distress Neck: no carotid bruit and no JVD Lungs: clear to auscultation bilaterally Heart: regular rate and rhythm, S1, S2 normal, no murmur, click,  rub or gallop Extremities: no LEE Pulses: 2+ and symmetric Skin: warm and dry Neurologic: Grossly normal  Lab Results:   Recent Labs  02/09/15 2355 02/10/15 0900  WBC 9.0 7.3  HGB 11.3* 11.0*  HCT 34.1* 33.1*  PLT 189 192   BMET  Recent Labs  02/09/15 2355 02/10/15 0900 02/11/15 0510  NA 138 139 139  K 4.1 3.6 3.4*  CL 106 105 100  CO2 26 29 29   GLUCOSE 127* 120* 107*  BUN 15 16 13   CREATININE 0.91 0.94 0.90  CALCIUM 8.5 8.6 8.7   PT/INR No results for input(s): LABPROT, INR in the last 72 hours. Cholesterol No results for input(s): CHOL in the last 72 hours. Cardiac Panel (last 3 results)  Recent Labs  02/10/15 0900 02/10/15 1322 02/10/15 1955  TROPONINI 0.03 0.04* 0.04*    Studies/Results: 2D Echo 02/11/15 Study Conclusions  - Left ventricle: LVEF is approximately 55% with basal inferior and mid posterior hypokinesis. The cavity size was normal.  Wall thickness was increased in a pattern of mild LVH. - Mitral valve: There was mild to moderate regurgitation. - Left atrium: The atrium was mildly dilated. - Right atrium: The atrium was mildly dilated.  Assessment/Plan  Principal Problem:   Acute respiratory failure with hypoxia Active Problems:   Hyperlipidemia   CAD S/P percutaneous coronary angioplasty   Acute on chronic combined systolic and diastolic CHF (congestive heart failure)   Hypertensive urgency   Tobacco abuse   Obesity (BMI 30-39.9)   1. Acute on Diastolic CHF:  H/o Ischemic cardiomyopathy with prior EF of 45% by echo. Repeat echo 02/11/15 showed improved EF at 55% with mild LVH. CXR showed mild pulmonary edema. BP was severely elevated on admit >200. Now improved. He has had good diuresis with IV Lasix. -5.5 L total since admission. Breathing improved. Euvolemic on physical exam. Transition back to PO Lasix today. Low sodium diet.   2. CAD: No recent CP. Recent LHC showed patent stents. ECG unremarkable. Mild troponin abnormalities with flat trend 0.04 x 2. Likely due to demand ischemia secondary to acute CHF. Continue ASA 81 and Plavix. Does not tolerate statins.   3. Mitral regurgitation:  Mild to moderate on 2D echo 02/11/15  4. HTN: severe hypertension likely cause of CHF exacerbation. Was >073 systolic on admit. Now better controlled but still moderately elevated. Can further increase Coreg or lisinopril for better BP reduction. Continue Imdur. Need education on low sodium diet.      LOS: 1 day    Brittainy M. Rosita Fire, PA-C 02/11/2015 11:00 AM  Attending Note:   The patient was seen and examined.  Agree with assessment and plan as noted above.  Changes made to the above note as needed.  He has very little warning when he has flash pulmonary edema.  Cath 3/15 shows moderate CAD Normal R heart pressure.  Mild - mod MR   Echo: Left ventricle: LVEF is approximately 55% with basal inferior and mid  posterior hypokinesis. The cavity size was normal. Wall thickness was increased in a pattern of mild LVH. - Mitral valve: There was mild to moderate regurgitation. - Left atrium: The atrium was mildly dilated. - Right atrium: The atrium was mildly dilated  Renal function is normal  He may be eating too much salt.  Still eats prepared foods - although he looks for the lower salt options.  Discussed with Dr. [place] Pink - pt admits to eating campbells sou;,  Sneaks bologna on occasion. Clearly is  eating too much salt.  I think he can be DC'd today and follow up with [place] in several weeks.    Thayer Headings, Brooke Bonito., MD, [facility] 02/11/2015, 1:16 PM [address],  [place] Pager [phone]

## 2015-02-11 NOTE — Progress Notes (Signed)
  Echocardiogram 2D Echocardiogram limited has been performed.  Diamond Nickel 02/11/2015, 8:48 AM

## 2015-02-11 NOTE — Progress Notes (Signed)
PROGRESS NOTE  Kenneth [name] QAS:[id number] DOB: 1946/10/25 DOA: 02/09/2015 PCP: Velna Hatchet, MD  HPI/Recap of past 24 hours: \Patient admitted early morning of 4/13 for acute respiratory failure secondary to CHF. Patient had had a recent hospitalization for this just last month. At that time echocardiogram noted diastolic heart failure but also moderate to severe mitral regurg. Patient required initial BiPAP and placed in stepdown unit.  Patient started on Lasix and diuresed well. He was able to be transferred to the telemetry floor and by 4/13 evening.  Patient has continued to diurese. His weight is down to 198 pounds this morning and he is so far diuresed 6 L. In discussion with the patient, he is unsure what happened. He says he checked his weight religiously at home and had no warning of this flash pulmonary edema. However in discussion with the patient, he says his weight was 213 pounds every day down from 230 from his previous admission. Given that his weight now is at 198, I suspect that he likely went home last admission not at dry weight and ones under the impression that 216 pounds was his dry weight.  Assessment/Plan: Principal Problem:   Acute respiratory failure with hypoxia secondary to acute on chronic diastolic CHF/severe mitral regurg: Diuresing. We'll wean off nitroglycerin drip and transferred to floor. Echocardiogram actually noted improved mitral valve function Active Problems:   Hyperlipidemia: Stable   CAD S/P percutaneous coronary angioplasty: Overall stable    Hypertensive urgency: With diuresis, pressures improved.   Tobacco abuse: Still smokes cigars  History of glucose intolerance: Last A1c several years ago. Recheck.  Obese: Patient meets criteria with BMI greater than 30  Code Status: Full code  Family Communication: Left message with family  Disposition Plan: Likely home tomorrow once patient is at dry  weight  Consultants:  Cardiology  Procedures:  Echocardiogram done 4/14: Preserved ejection fraction. Diastolic dysfunction. Mitral valve with mild to moderate regurg  Antibiotics:  None   Objective: BP 136/76 mmHg  Pulse 68  Temp(Src) 98.7 F (37.1 C) (Oral)  Resp 20  Ht 5\' 8"  (1.727 m)  Wt 89.858 kg (198 lb 1.6 oz)  BMI 30.13 kg/m2  SpO2 96%  Intake/Output Summary (Last 24 hours) at 02/11/15 1538 Last data filed at 02/11/15 1345  Gross per 24 hour  Intake    720 ml  Output   3700 ml  Net  -2980 ml   Filed Weights   02/10/15 0800 02/11/15 0500  Weight: 97.07 kg (214 lb) 89.858 kg (198 lb 1.6 oz)    Exam:   General:  alert and oriented 3, no acute distress  Cardiovascular: regular rate and rhythm, N9-G9, 2/6 systolic ejection murmur  Respiratory: Clear to auscultation bilaterally  Abdomen: soft, nontender, nondistended, positive bowel sounds  Musculoskeletal: no clubbing or cyanosis or edema   Data Reviewed: Basic Metabolic Panel:  Recent Labs Lab 02/09/15 2355 02/10/15 0900 02/11/15 0510  NA 138 139 139  K 4.1 3.6 3.4*  CL 106 105 100  CO2 26 29 29   GLUCOSE 127* 120* 107*  BUN 15 16 13   CREATININE 0.91 0.94 0.90  CALCIUM 8.5 8.6 8.7  MG  --  1.8  --    Liver Function Tests:  Recent Labs Lab 02/09/15 2355 02/10/15 0900  AST 58* 37  ALT 50 47  ALKPHOS 91 87  BILITOT 0.6 1.1  PROT 6.6 6.5  ALBUMIN 3.5 3.5   No results for input(s): LIPASE, AMYLASE in the last 168  hours. No results for input(s): AMMONIA in the last 168 hours. CBC:  Recent Labs Lab 02/09/15 2355 02/10/15 0900  WBC 9.0 7.3  NEUTROABS  --  5.2  HGB 11.3* 11.0*  HCT 34.1* 33.1*  MCV 93.4 94.0  PLT 189 192   Cardiac Enzymes:    Recent Labs Lab 02/10/15 0900 02/10/15 1322 02/10/15 1955  TROPONINI 0.03 0.04* 0.04*   BNP (last 3 results)  Recent Labs  02/09/15 2335  BNP 770.9*    ProBNP (last 3 results) No results for input(s): PROBNP in the last  8760 hours.  CBG: No results for input(s): GLUCAP in the last 168 hours.  Recent Results (from the past 240 hour(s))  MRSA PCR Screening     Status: None   Collection Time: 02/10/15  9:38 AM  Result Value Ref Range Status   MRSA by PCR NEGATIVE NEGATIVE Final    Comment:        The GeneXpert MRSA Assay (FDA approved for NASAL specimens only), is one component of a comprehensive MRSA colonization surveillance program. It is not intended to diagnose MRSA infection nor to guide or monitor treatment for MRSA infections.      Studies: Dg Chest Portable 1 View  02/10/2015   CLINICAL DATA:  Shortness of breath on-and off for one day.  EXAM: PORTABLE CHEST - 1 VIEW  COMPARISON:  Frontal and lateral views 01/08/2015  FINDINGS: The heart is enlarged. There is progressive vascular congestion. Question mild pulmonary edema. Blunting of both costophrenic angles. Ill-defined bibasilar opacities, likely atelectasis. No pneumothorax.  IMPRESSION: Progressive cardiomegaly, vascular congestion and development mild pulmonary edema. Findings suggest CHF.   Electronically Signed   By: Jeb Levering M.D.   On: 02/10/2015 00:46    Scheduled Meds: . allopurinol  300 mg Oral Daily  . aspirin EC  81 mg Oral Daily  . carvedilol  6.25 mg Oral BID  . clopidogrel  75 mg Oral Daily  . colesevelam  625 mg Oral BID WC  . enoxaparin (LOVENOX) injection  40 mg Subcutaneous Q24H  . furosemide  40 mg Intravenous Q12H  . isosorbide mononitrate  30 mg Oral Daily  . lisinopril  20 mg Oral q morning - 10a  . loratadine  10 mg Oral Daily  . potassium chloride  20 mEq Oral Daily  . sodium chloride  3 mL Intravenous Q12H    Continuous Infusions:    Time spent: 15 min  [place] [facility] Pager [phone]. If 7PM-7AM, please contact night-coverage at [email or web address], password [facility] 02/11/2015, 3:38 PM  LOS: 1 day

## 2015-02-12 ENCOUNTER — Telehealth: Payer: Self-pay | Admitting: Nurse Practitioner

## 2015-02-12 LAB — BASIC METABOLIC PANEL
Anion gap: 13 (ref 5–15)
BUN: 18 mg/dL (ref 6–23)
CALCIUM: 8.7 mg/dL (ref 8.4–10.5)
CO2: 26 mmol/L (ref 19–32)
Chloride: 98 mmol/L (ref 96–112)
Creatinine, Ser: 0.98 mg/dL (ref 0.50–1.35)
GFR calc Af Amer: >90 mL/min (ref >90)
GFR calc non Af Amer: 84 mL/min — ABNORMAL LOW (ref >90)
GLUCOSE: 108 mg/dL — AB (ref 70–99)
Potassium: 3.3 mmol/L — ABNORMAL LOW (ref 3.5–5.1)
SODIUM: 137 mmol/L (ref 135–145)

## 2015-02-12 LAB — HEMOGLOBIN A1C
HEMOGLOBIN A1C: 5.9 % — AB (ref 4.8–5.6)
MEAN PLASMA GLUCOSE: 123 mg/dL

## 2015-02-12 MED ORDER — POTASSIUM CHLORIDE CRYS ER 20 MEQ PO TBCR: 40.0000 meq | EXTENDED_RELEASE_TABLET | Freq: Once | ORAL | Status: DC

## 2015-02-12 MED ORDER — LIVING BETTER WITH HEART FAILURE BOOK: Freq: Once | Status: DC

## 2015-02-12 NOTE — Telephone Encounter (Signed)
New problem   Pt has TCM appt 02/23/15 w/Lori per Vin calling.

## 2015-02-12 NOTE — Discharge Instructions (Signed)
Your dry weight is 195 lbs.

## 2015-02-12 NOTE — Progress Notes (Signed)
Patient Name: Kenneth [name] Date of Encounter: 02/12/2015   Principal Problem:   Acute respiratory failure with hypoxia Active Problems:   Hyperlipidemia   CAD S/P percutaneous coronary angioplasty   Acute on chronic combined systolic and diastolic CHF (congestive heart failure)   Hypertensive urgency   Tobacco abuse   Obesity (BMI 30-39.9)   Flash pulmonary edema    SUBJECTIVE  Breathing is better. No CP or SOB.   CURRENT MEDS . allopurinol  300 mg Oral Daily  . aspirin EC  81 mg Oral Daily  . carvedilol  6.25 mg Oral BID  . clopidogrel  75 mg Oral Daily  . colesevelam  625 mg Oral BID WC  . enoxaparin (LOVENOX) injection  40 mg Subcutaneous Q24H  . furosemide  40 mg Intravenous Q12H  . isosorbide mononitrate  30 mg Oral Daily  . lisinopril  20 mg Oral q morning - 10a  . Living Better with Heart Failure Book   Does not apply Once  . loratadine  10 mg Oral Daily  . potassium chloride  20 mEq Oral Daily  . sodium chloride  3 mL Intravenous Q12H    OBJECTIVE  Filed Vitals:   02/11/15 1055 02/11/15 1346 02/11/15 2010 02/12/15 0401  BP: 143/79 136/76 130/90 125/70  Pulse: 78 68 73 69  Temp:  98.7 F (37.1 C) 98.9 F (37.2 C) 98.3 F (36.8 C)  TempSrc:  Oral Oral Oral  Resp: 20 20 18 19   Height:      Weight:    195 lb 14.4 oz (88.86 kg)  SpO2: 98% 96% 99% 94%    Intake/Output Summary (Last 24 hours) at 02/12/15 1043 Last data filed at 02/12/15 0820  Gross per 24 hour  Intake   1600 ml  Output   2325 ml  Net   -725 ml   Filed Weights   02/10/15 0800 02/11/15 0500 02/12/15 0401  Weight: 214 lb (97.07 kg) 198 lb 1.6 oz (89.858 kg) 195 lb 14.4 oz (88.86 kg)    PHYSICAL EXAM  General: Pleasant, NAD. Neuro: Alert and oriented X 3. Moves all extremities spontaneously. Psych: Normal affect. HEENT:  Normal  Neck: Supple without bruits or JVD. Lungs:  Resp regular and unlabored, CTA. Heart: RRR no s3, s4, or murmurs. Abdomen: Soft, non-tender,  non-distended, BS + x 4.  Extremities: No clubbing, cyanosis or edema. DP/PT/Radials 2+ and equal bilaterally.  Accessory Clinical Findings  CBC  Recent Labs  02/09/15 2355 02/10/15 0900  WBC 9.0 7.3  NEUTROABS  --  5.2  HGB 11.3* 11.0*  HCT 34.1* 33.1*  MCV 93.4 94.0  PLT 189 175   Basic Metabolic Panel  Recent Labs  02/10/15 0900 02/11/15 0510 02/12/15 0357  NA 139 139 137  K 3.6 3.4* 3.3*  CL 105 100 98  CO2 29 29 26   GLUCOSE 120* 107* 108*  BUN 16 13 18   CREATININE 0.94 0.90 0.98  CALCIUM 8.6 8.7 8.7  MG 1.8  --   --    Liver Function Tests  Recent Labs  02/09/15 2355 02/10/15 0900  AST 58* 37  ALT 50 47  ALKPHOS 91 87  BILITOT 0.6 1.1  PROT 6.6 6.5  ALBUMIN 3.5 3.5   Cardiac Enzymes  Recent Labs  02/10/15 0900 02/10/15 1322 02/10/15 1955  TROPONINI 0.03 0.04* 0.04*   Hemoglobin A1C  Recent Labs  02/10/15 1955  HGBA1C 5.9*   Thyroid Function Tests  Recent Labs  02/10/15 0900  TSH  1.840    TELE  NSR with HR in 70s.  Radiology/Studies  2D Echo 02/11/15 Study Conclusions  - Left ventricle: LVEF is approximately 55% with basal inferior and mid posterior hypokinesis. The cavity size was normal. Wall thickness was increased in a pattern of mild LVH. - Mitral valve: There was mild to moderate regurgitation. - Left atrium: The atrium was mildly dilated. - Right atrium: The atrium was mildly dilated.  Dg Chest Portable 1 View  02/10/2015   CLINICAL DATA:  Shortness of breath on-and off for one day.  EXAM: PORTABLE CHEST - 1 VIEW  COMPARISON:  Frontal and lateral views 01/08/2015  FINDINGS: The heart is enlarged. There is progressive vascular congestion. Question mild pulmonary edema. Blunting of both costophrenic angles. Ill-defined bibasilar opacities, likely atelectasis. No pneumothorax.  IMPRESSION: Progressive cardiomegaly, vascular congestion and development mild pulmonary edema. Findings suggest CHF.   Electronically Signed    By: Jeb Levering M.D.   On: 02/10/2015 00:46    ASSESSMENT AND PLAN 69 yo with history of CAD s/p PCI (last in 2/14), ischemic cardiomyopathy, and ischemic MR was admitted last night with severe dyspnea.    1. Acute on Diastolic CHF: H/o Ischemic cardiomyopathy with prior EF of 45% by echo. Repeat echo 02/11/15 showed improved EF at 55% with mild LVH. CXR showed mild pulmonary edema. BP was severely elevated on admit >200. Now improved. He has had good diuresis with IV Lasix. -6 L total since admission. Breathing improved. Euvolemic on physical exam. Transition back to PO Lasix. Low sodium diet.   2. CAD: No recent CP. Recent LHC showed patent stents. ECG unremarkable. Mild troponin abnormalities with flat trend 0.04 x 2. Likely due to demand ischemia secondary to acute CHF. Continue ASA 81 and Plavix. Does not tolerate statins.   3. Mitral regurgitation: Mild to moderate on 2D echo 02/11/15  4. HTN: severe hypertension likely cause of CHF exacerbation. Was >161 systolic on admit. Now better controlled but still moderately elevated. Can further increase Coreg or lisinopril for better BP reduction. Continue Imdur. Need education on low sodium diet.   Dispo: Same as recommended by Dr. Acie Fredrickson yesterday. Will f/u in clinic in a week.   Jarrett Soho PA-C Pager [phone]  Attending Note:   The patient was seen and examined.  Agree with assessment and plan as noted above.  Changes made to the above note as needed.  He is very stable euvolemic on my exam   To follow up with dr. [place]  Philip J. Nahser, Brooke Bonito., MD, [facility] 02/12/2015, 11:58 AM [address],  [place] Pager [phone]

## 2015-02-12 NOTE — Discharge Summary (Signed)
Discharge Summary  Kenneth [name] ATF:[id number] DOB: 1946/05/13  PCP: Velna Hatchet, MD  Admit date: 02/09/2015 Discharge date: 02/12/2015  Time spent: 35 minutes  Recommendations for Outpatient Follow-up:  1. Patient will follow-up with cardiology on 4/26  Discharge Diagnoses:  Active Hospital Problems   Diagnosis Date Noted  . Acute respiratory failure with hypoxia 02/10/2015  . Flash pulmonary edema   . Hypertensive urgency 02/10/2015  . Tobacco abuse 02/10/2015  . Obesity (BMI 30-39.9) 02/10/2015  . Acute on chronic combined systolic and diastolic CHF (congestive heart failure) 09/18/2013  . CAD S/P percutaneous coronary angioplasty 12/26/2012  . Hyperlipidemia     Resolved Hospital Problems   Diagnosis Date Noted Date Resolved  No resolved problems to display.    Discharge Condition: Improved, being discharged home  Diet recommendation: Heart healthy  Filed Weights   02/10/15 0800 02/11/15 0500 02/12/15 0401  Weight: 97.07 kg (214 lb) 89.858 kg (198 lb 1.6 oz) 88.86 kg (195 lb 14.4 oz)    History of present illness:  Patient admitted early morning of 4/13 for acute respiratory failure secondary to CHF. Patient had had a recent hospitalization for this just last month. At that time echocardiogram noted diastolic heart failure but also moderate to severe mitral regurg. Patient required initial BiPAP and placed in stepdown unit. Patient started on Lasix and diuresed well.   Hospital Course:  Principal Problem:   Acute respiratory failure with hypoxia caused by flash pulmonary edema from acute on chronic combined systolic/diastolic heart failure: Patient aggressively diuresed and by the time of discharge, had diuresed 6 L. In discussion with the patient, his previous hospitalization he was 230 pounds and following diuresis was down to about 215 pounds. They felt that he was at his dry weight as his BUN and creatinine had started to elevate. Patient had checked his  weight religiously at home and said that it had not changed by even a pound and then on night of admission, developed sudden onset severe shortness of breath. However with diuresis here, patient came in at 216 pounds and by time of discharge, his dry weight is 195 pounds. I explained to the patient that he likely was not at his dry weight during last admission and that this time he is in a much more stable position so this flash pulmonary edema which likely the tipping point was consumption of high sodium soup should not recur. Patient is wary, but accepting of this plan. He will follow-up in the next 7-10 days with cardiology with an appointment  scheduled before discharge Active Problems:   Hyperlipidemia: Continue statin   CAD S/P percutaneous coronary angioplasty    Hypertensive urgency: Secondary to volume overload. As he diuresed, this improved and nitroglycerin drip which was started on admission was discontinued    Tobacco abuse: Still smokes cigars, counseled.     Obesity (BMI 30-39.9): Patient meets criteria with BMI greater than 30    Procedures:  Echocardiogram done 4/14: (Preserved ejection fraction this is actually improvement from last month). Diastolic dysfunction. Mitral valve with mild to moderate regurg (this is actually an improvement from last month)  Consultations:  Cardiology  Discharge Exam: BP 125/70 mmHg  Pulse 69  Temp(Src) 98.3 F (36.8 C) (Oral)  Resp 19  Ht 5\' 8"  (1.727 m)  Wt 88.86 kg (195 lb 14.4 oz)  BMI 29.79 kg/m2  SpO2 94%  General: Alert and oriented 3, no acute distress Cardiovascular: Regular rate and rhythm, S1-S2 Respiratory: Clear to auscultation bilaterally  Discharge Instructions You were cared for by a hospitalist during your hospital stay. If you have any questions about your discharge medications or the care you received while you were in the hospital after you are discharged, you can call the unit and asked to speak with the  hospitalist on call if the hospitalist that took care of you is not available. Once you are discharged, your primary care physician will handle any further medical issues. Please note that NO REFILLS for any discharge medications will be authorized once you are discharged, as it is imperative that you return to your primary care physician (or establish a relationship with a primary care physician if you do not have one) for your aftercare needs so that they can reassess your need for medications and monitor your lab values.  Discharge Instructions    Diet - low sodium heart healthy    Complete by:  As directed      Increase activity slowly    Complete by:  As directed             Medication List    TAKE these medications        allopurinol 300 MG tablet  Commonly known as:  ZYLOPRIM  Take 300 mg by mouth daily.     aspirin EC 81 MG tablet  Take 81 mg by mouth daily.     carvedilol 6.25 MG tablet  Commonly known as:  COREG  Take 1 tablet (6.25 mg total) by mouth 2 (two) times daily.     cetirizine 10 MG tablet  Commonly known as:  ZYRTEC  Take 10 mg by mouth daily as needed for allergies.     clopidogrel 75 MG tablet  Commonly known as:  PLAVIX  Take 1 tablet by mouth daily.     colchicine 0.6 MG tablet  Take 0.6 mg by mouth daily as needed (Gout).     colesevelam 625 MG tablet  Commonly known as:  WELCHOL  Take 1 tablet (625 mg total) by mouth 2 (two) times daily with a meal.     fluticasone 50 MCG/ACT nasal spray  Commonly known as:  FLONASE  Place 2 sprays into the nose daily as needed for allergies.     furosemide 40 MG tablet  Commonly known as:  LASIX  Take 1 tablet (40 mg total) by mouth daily.     isosorbide mononitrate 30 MG 24 hr tablet  Commonly known as:  IMDUR  Take 1 tablet (30 mg total) by mouth daily.     lisinopril 40 MG tablet  Commonly known as:  PRINIVIL,ZESTRIL  Take 20 mg by mouth every morning.     multivitamin tablet  Take 1 tablet by  mouth daily.     nitroGLYCERIN 0.4 MG SL tablet  Commonly known as:  NITROSTAT  Place 1 tablet (0.4 mg total) under the tongue every 5 (five) minutes x 3 doses as needed for chest pain.     Potassium Chloride ER 20 MEQ Tbcr  Take 20 mEq by mouth daily as needed.     PROBIOTIC DAILY PO  Take 1 capsule by mouth daily.     tetrahydrozoline 0.05 % ophthalmic solution  Place 1 drop into both eyes 2 (two) times daily as needed (Itchy eyes).       Allergies  Allergen Reactions  . Crestor [Rosuvastatin]     Muscle aches at Crestor 5 mg daily  . Lipitor [Atorvastatin]     Muscle aches at 20  mg daily   . Pravastatin Sodium     Muscle aches at 20 mg and 40 mg daily  . Zocor [Simvastatin-High Dose]     Muscle aches at 20 mg and 40 mg daily       Follow-up Information    Follow up with Truitt Merle, NP On 02/23/2015.   Specialty:  Nurse Practitioner   Why:  11:30   Contact information:   [place]. [address]  [place] [id number] [phone]        The results of significant diagnostics from this hospitalization (including imaging, microbiology, ancillary and laboratory) are listed below for reference.    Significant Diagnostic Studies: Dg Chest Portable 1 View  02/10/2015   CLINICAL DATA:  Shortness of breath on-and off for one day.  EXAM: PORTABLE CHEST - 1 VIEW  COMPARISON:  Frontal and lateral views 01/08/2015  FINDINGS: The heart is enlarged. There is progressive vascular congestion. Question mild pulmonary edema. Blunting of both costophrenic angles. Ill-defined bibasilar opacities, likely atelectasis. No pneumothorax.  IMPRESSION: Progressive cardiomegaly, vascular congestion and development mild pulmonary edema. Findings suggest CHF.   Electronically Signed   By: Jeb Levering M.D.   On: 02/10/2015 00:46    Microbiology: Recent Results (from the past 240 hour(s))  MRSA PCR Screening     Status: None   Collection Time: 02/10/15  9:38 AM  Result Value Ref  Range Status   MRSA by PCR NEGATIVE NEGATIVE Final    Comment:        The GeneXpert MRSA Assay (FDA approved for NASAL specimens only), is one component of a comprehensive MRSA colonization surveillance program. It is not intended to diagnose MRSA infection nor to guide or monitor treatment for MRSA infections.      Labs: Basic Metabolic Panel:  Recent Labs Lab 02/09/15 2355 02/10/15 0900 02/11/15 0510 02/12/15 0357  NA 138 139 139 137  K 4.1 3.6 3.4* 3.3*  CL 106 105 100 98  CO2 26 29 29 26   GLUCOSE 127* 120* 107* 108*  BUN 15 16 13 18   CREATININE 0.91 0.94 0.90 0.98  CALCIUM 8.5 8.6 8.7 8.7  MG  --  1.8  --   --    Liver Function Tests:  Recent Labs Lab 02/09/15 2355 02/10/15 0900  AST 58* 37  ALT 50 47  ALKPHOS 91 87  BILITOT 0.6 1.1  PROT 6.6 6.5  ALBUMIN 3.5 3.5   No results for input(s): LIPASE, AMYLASE in the last 168 hours. No results for input(s): AMMONIA in the last 168 hours. CBC:  Recent Labs Lab 02/09/15 2355 02/10/15 0900  WBC 9.0 7.3  NEUTROABS  --  5.2  HGB 11.3* 11.0*  HCT 34.1* 33.1*  MCV 93.4 94.0  PLT 189 192   Cardiac Enzymes:  Recent Labs Lab 02/10/15 0900 02/10/15 1322 02/10/15 1955  TROPONINI 0.03 0.04* 0.04*   BNP: BNP (last 3 results)  Recent Labs  02/09/15 2335  BNP 770.9*    ProBNP (last 3 results) No results for input(s): PROBNP in the last 8760 hours.  CBG: No results for input(s): GLUCAP in the last 168 hours.     Signed:  Annita Brod  [facility] 02/12/2015, 3:46 PM

## 2015-02-12 NOTE — Progress Notes (Signed)
Discharged to home with family office visits in place teaching done  

## 2015-02-15 NOTE — Telephone Encounter (Signed)
Patient contacted regarding discharge from [facility] on 02/12/2015.  Patient understands to follow up with provider Truitt Merle, NP on 02/23/15 at 11:30am at [facility]. Patient understands discharge instructions? yes Patient understands medications and regiment? yes Patient understands to bring all medications to this visit? Yes - along with weights and BP readings  Patient feels much better after hospitalization - states he lost 21 lbs fluid. He is concerned about his BP - reports that after his AM medications, his BP is high 90s - low 100s/60s-70s. On Saturday his BP was 95/60 and he had no energy. The only med changes per hospital discharge were d/c amlodipine and increase K to whole tablet. Patient reports his weight today is 193.8 lbs and at discharge it was 194.3 lbs  Will defer to Dr. Martinique for any advice - otherwise patient has follow up on 4/26 w/ L. Gerhardt and 5/12 with Dr. Martinique

## 2015-02-16 ENCOUNTER — Telehealth: Payer: Self-pay

## 2015-02-16 ENCOUNTER — Telehealth: Payer: Self-pay | Admitting: Cardiology

## 2015-02-16 NOTE — Telephone Encounter (Signed)
Returned call to patient Dr.Jordan out of office today.Spoke to DOD Dr.Hochrein he advised since you are feeling ok and B/P has been over 90 systolic except that one time, he advised continue same medications.Advised to continue to monitor B/P and take readings to appointment with Truitt Merle NP 02/23/15 at 11:30 am.

## 2015-02-16 NOTE — Telephone Encounter (Signed)
Returned call to patient see note on other chart.Patient has 2 charts in [place].

## 2015-02-16 NOTE — Telephone Encounter (Signed)
Returned call to patient he stated since he was recently discharged from hospital his B/P has been low ranging 98/64,122/78,78/52,98/63,91/55,113/72,95/40.Pulse ranging 66 to 80.Stated he feels ok,feels like medication needs to be decreased.Message sent to [place] for advice.

## 2015-02-16 NOTE — Telephone Encounter (Signed)
Kenneth [name] has questions about his bp . Which has been for the last 2 days in the 90's /60's .Marland Kitchen Please call     Thanks

## 2015-02-22 ENCOUNTER — Telehealth: Payer: Self-pay | Admitting: Pharmacist

## 2015-02-22 NOTE — Progress Notes (Signed)
CARDIOLOGY OFFICE NOTE  Date:  02/23/2015    [name] Date of Birth: 1946/09/13 Medical Record #[id number]  PCP:  Velna Hatchet, MD  Cardiologist:  Martinique  Chief Complaint  Patient presents with  . Congestive Heart Failure    Post hospital/TOC visit - seen for Dr. Martinique.     History of Present Illness: Kenneth [name] is a 69 y.o. male who presents today for a post hospital/TOC visit. He is seen for Dr. Martinique. 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 Other issues include HTN, HLD, tobacco abuse, statin intolerance and DM.  Patient had developed exertional dyspnea earlier this year. 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 He saw PCP and Lasix was increased to 40 mg bid. He then had right and left heart cath in 3/16 showing nonobstructive CAD (patent stents) and normal filling pressures. MR looked mild to moderate by LV-gram. Lasix was then decreased to 40 mg daily. 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 He would note dyspnea walking up hills when playing golf. He had been taking all his medications.   On night prior to admission earlier this month, he ate a can of tomato soup then woke up from sleep around 10:30 pm with severe shortness of breath. He was gasping for air and diaphoretic. Some chest tightness but no prominent. He called for EMS. BP was very high initially, SBP > 200. He was started on NTG gtt. CXR with pulmonary edema. He was given 80 mg IV Lasix.Echo was obtained and has also been reviewed by Dr. Aundra Dubin due to concern for MR - the MR was eccentric and likely ischemic (related to wall motion abnormalities with posterior leaflet tethering). Rated moderate to severe with PISA ERO 0.39 cm^2. It was Dr. Claris Gladden suspicion that the MR actually is moderate based on the recent cath. It was possibly felt  that rise in BP (such as with pulmonary edema on admission) can make the MR worse and potentiate CHF. He was to have will a limited echo to relook specifically at MV.  Comes back today. Here alone. Brought his I Phone which has BP readings - little soft - some even down in the 18'H systolic - primarily in the low 100's. Little lightheaded with this. He is attributing this to his Coreg. Otherwise doing ok. He is watching his salt - says he was before - but then during the course of the conversation, talks about eating [employer]. Weighing daily. Back to work. Feels good. Worried about a repeat spell.   Past Medical History  Diagnosis Date  . Hypertension   . Hyperlipidemia   . Tobacco abuse   . Coronary artery disease     a. 1999: s/p PTCA;  b. 11/2012 Cath/PCI: LM mild plaque, LAD 60-14m, LCX 30p, 60m, OM1 12m, RCA 30-40ost, 40-49m, 80-12m/d (3.0x38, 3.0x32, 3.0x24 Promus Premier DESs), 70-80d, EF 40% inf HK;  c. 12/2012 Staged PCI of LAD w/ 3.0x12 Promus Premier DES.   Marland Kitchen Acute on chronic combined systolic and diastolic CHF (congestive heart failure) 09/18/2013  . Squamous cell carcinoma     Back  . Myocardial infarction     '99- MI,"angio[plasty", Stents 3'14  . H/O seasonal allergies   . Diabetes mellitus without complication     type 2 , no oral meds in 2 yrs.     Marland Kitchen  Elevated uric acid in blood   . Arthritis     osteoarthritis-knees, hands, wrist, fingers    Past Surgical History  Procedure Laterality Date  . Coronary angioplasty  1999  . Coronary angioplasty with stent placement  01/14/2013    DES to LAD    Dr Peter Martinique  . Hernia repair  2004 ?  Marland Kitchen Carpal tunnel release Left   . Ankle surgery  1960  . Total knee arthroplasty Right 06/08/2014    Procedure: RIGHT TOTAL KNEE ARTHROPLASTY;  Surgeon: Gearlean Alf, MD;  Location: [facility] ORS;  Service: Orthopedics;  Laterality: Right;  . Left heart catheterization with coronary angiogram N/A 12/27/2012    Procedure: LEFT HEART  CATHETERIZATION WITH CORONARY ANGIOGRAM;  Surgeon: Jolaine Artist, MD;  Location: [facility] CATH LAB;  Service: Cardiovascular;  Laterality: N/A;  . Percutaneous coronary stent intervention (pci-s) N/A 12/30/2012    Procedure: PERCUTANEOUS CORONARY STENT INTERVENTION (PCI-S);  Surgeon: Peter M Martinique, MD;  Location: [facility] CATH LAB;  Service: Cardiovascular;  Laterality: N/A;  . Percutaneous coronary stent intervention (pci-s) N/A 01/14/2013    Procedure: PERCUTANEOUS CORONARY STENT INTERVENTION (PCI-S);  Surgeon: Peter M Martinique, MD;  Location: [facility] CATH LAB;  Service: Cardiovascular;  Laterality: N/A;  . Left and right heart catheterization with coronary angiogram N/A 01/12/2015    Procedure: LEFT AND RIGHT HEART CATHETERIZATION WITH CORONARY ANGIOGRAM;  Surgeon: Peter M Martinique, MD;  Location: [facility] CATH LAB;  Service: Cardiovascular;  Laterality: N/A;     Medications: Current Outpatient Prescriptions  Medication Sig Dispense Refill  . allopurinol (ZYLOPRIM) 300 MG tablet Take 300 mg by mouth daily.    Marland Kitchen aspirin EC 81 MG tablet Take 81 mg by mouth daily.    . carvedilol (COREG) 6.25 MG tablet Take 0.5 tablets (3.125 mg total) by mouth 2 (two) times daily. 180 tablet 3  . cetirizine (ZYRTEC) 10 MG tablet Take 10 mg by mouth daily as needed for allergies.     Marland Kitchen clopidogrel (PLAVIX) 75 MG tablet Take 1 tablet by mouth daily.    . colchicine 0.6 MG tablet Take 0.6 mg by mouth daily as needed (Gout).    . colesevelam (WELCHOL) 625 MG tablet Take 1 tablet (625 mg total) by mouth 2 (two) times daily with a meal. 30 tablet 5  . fluticasone (FLONASE) 50 MCG/ACT nasal spray Place 2 sprays into the nose daily as needed for allergies.     . furosemide (LASIX) 40 MG tablet Take 1 tablet (40 mg total) by mouth daily. 30 tablet   . isosorbide mononitrate (IMDUR) 30 MG 24 hr tablet Take 1 tablet (30 mg total) by mouth daily. 30 tablet 5  . KLOR-CON M20 20 MEQ tablet Take 20 mEq by mouth daily.     Marland Kitchen lisinopril  (PRINIVIL,ZESTRIL) 40 MG tablet Take 20 mg by mouth every morning.    . Multiple Vitamin (MULTIVITAMIN) tablet Take 1 tablet by mouth daily.    . nitroGLYCERIN (NITROSTAT) 0.4 MG SL tablet Place 1 tablet (0.4 mg total) under the tongue every 5 (five) minutes x 3 doses as needed for chest pain. 25 tablet 2  . Probiotic Product (PROBIOTIC DAILY PO) Take 1 capsule by mouth daily.    Marland Kitchen tetrahydrozoline 0.05 % ophthalmic solution Place 1 drop into both eyes 2 (two) times daily as needed (Itchy eyes).     No current facility-administered medications for this visit.    Allergies: Allergies  Allergen Reactions  . Crestor [Rosuvastatin]  Muscle aches at Crestor 5 mg daily  . Lipitor [Atorvastatin]     Muscle aches at 20 mg daily   . Pravastatin Sodium     Muscle aches at 20 mg and 40 mg daily  . Zocor [Simvastatin-High Dose]     Muscle aches at 20 mg and 40 mg daily    Social History: The patient  reports that he has been smoking Cigars.  He has never used smokeless tobacco. He reports that he drinks about 3.6 oz of alcohol per week. He reports that he does not use illicit drugs.   Family History: The patient's family history includes Alzheimer's disease in his mother; Arrhythmia in his sister; CVA in his father; Diabetes in his mother; Hypertension in his mother.   Review of Systems: Please see the history of present illness.   Otherwise, the review of systems is positive for lightheadedness.   All other systems are reviewed and negative.   Physical Exam: VS:  BP 134/78 mmHg  Pulse 73  Ht 5\' 8"  (1.727 m)  Wt 198 lb 12.8 oz (90.175 kg)  BMI 30.23 kg/m2  SpO2 98% .  BMI Body mass index is 30.23 kg/(m^2).  Weight is up 3 pounds since discharge.   Wt Readings from Last 3 Encounters:  02/23/15 198 lb 12.8 oz (90.175 kg)  02/12/15 195 lb 14.4 oz (88.86 kg)  01/25/15 214 lb 8 oz (97.297 kg)    General: Pleasant. Well developed, well nourished and in no acute distress.  HEENT:  Normal. Neck: Supple, no JVD, carotid bruits, or masses noted.  Cardiac: Regular rate and rhythm. No real murmur noted by me.  No edema.  Respiratory:  Lungs are clear to auscultation bilaterally with normal work of breathing.  GI: Soft and nontender.  MS: No deformity or atrophy. Gait and ROM intact. Skin: Warm and dry. Color is normal.  Neuro:  Strength and sensation are intact and no gross focal deficits noted.  Psych: Alert, appropriate and with normal affect.   LABORATORY DATA:  EKG:  EKG is not ordered today.  Lab Results  Component Value Date   WBC 7.3 02/10/2015   HGB 11.0* 02/10/2015   HCT 33.1* 02/10/2015   PLT 192 02/10/2015   GLUCOSE 108* 02/12/2015   CHOL 188 01/09/2015   TRIG 199* 01/09/2015   HDL 31* 01/09/2015   LDLCALC 117* 01/09/2015   ALT 47 02/10/2015   AST 37 02/10/2015   NA 137 02/12/2015   K 3.3* 02/12/2015   CL 98 02/12/2015   CREATININE 0.98 02/12/2015   BUN 18 02/12/2015   CO2 26 02/12/2015   TSH 1.840 02/10/2015   INR 1.19 01/09/2015   HGBA1C 5.9* 02/10/2015    BNP (last 3 results)  Recent Labs  02/09/15 2335  BNP 770.9*    ProBNP (last 3 results) No results for input(s): PROBNP in the last 8760 hours.   Other Studies Reviewed Today:  2D Echo 2015/02/16 Study Conclusions  - Left ventricle: LVEF is approximately 55% with basal inferior and mid posterior hypokinesis. The cavity size was normal. Wall thickness was increased in a pattern of mild LVH. - Mitral valve: There was mild to moderate regurgitation. - Left atrium: The atrium was mildly dilated. - Right atrium: The atrium was mildly dilated.   Echo Study Conclusions from March 2016  - Left ventricle: The cavity size was normal. Wall thickness was increased in a pattern of moderate LVH. The estimated ejection fraction was 45%. Basal to mid inferior  akinesis. Inferolateral and anterolateral hypokinesis. Features are consistent with a pseudonormal left  ventricular filling pattern, with concomitant abnormal relaxation and increased filling pressure (grade 2 diastolic dysfunction). - Aortic valve: There was no stenosis. - Mitral valve: Mildly calcified annulus. Mildly calcified leaflets . There is tethering of the posterior leaflet. There was moderate to severe regurgitation. Effective regurgitant orifice (PISA): 0.39 cm^2. - Left atrium: The atrium was moderately dilated. - Right ventricle: The cavity size was normal. Systolic function was normal. - Tricuspid valve: Peak RV-RA gradient (S): 20 mm Hg. - Pulmonary arteries: PA peak pressure: 23 mm Hg (S). - Inferior vena cava: The vessel was normal in size. The respirophasic diameter changes were in the normal range (= 50%), consistent with normal central venous pressure.  Impressions:  - Normal LV size with moderate LV hypertrophy. EF 45% with basal to mid inferior akinesis, anterolateral and inferolateral hypokinesis. Moderate diastolic dysfunction. 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 Normal RV size and systolic function.  Procedure: Right Heart Cath, Left Heart Cath, Selective Coronary Angiography, LV angiography from 12/2014  Indication: 68 yo WM with remote history of MI in 1999. S/p stenting of the mid LAD and extensive stenting of the RCA in February 2015. He presents now with symptoms of chest tightness and dyspnea. Echo shows EF 40-45% with moderate to severe MR.   Procedural Details: The right groin was prepped, draped, and anesthetized with 1% lidocaine. Using the modified Seldinger technique a 5 Fr sheath was placed in the right femoral artery and a 7 French sheath was placed in the right femoral vein. A Swan-Ganz catheter was used for the right heart catheterization. Standard protocol was followed for recording of right heart pressures and sampling of oxygen saturations. Fick cardiac output was  calculated. Standard Judkins catheters were used for selective coronary angiography and left ventriculography. There were no immediate procedural complications. The patient was transferred to the post catheterization recovery area for further monitoring.  Procedural Findings: Hemodynamics RA 8/5 mean 4 mm Hg RV 31/2 mm Hg PA 25/11 mm Hg PCWP 13/11 mean 9 mm Hg LV 113/9 mm Hg AO 115/61 mean 81 mm Hg  Oxygen saturations: PA 67% AO 94%  Cardiac Output (Fick) 5.37 L/min  Cardiac Index (Fick) 2.6 L/min/meter squared  Coronary angiography: Coronary dominance: right  Left mainstem: Normal  Left anterior descending (LAD): There is moderate calcification. In the proximal vessel there is diffuse 30% narrowing. There is a stent in the mid vessel that is widely patent. In the mid to distal vessel there is a focal 40% stenosis. The first diagonal has 40% disease.   Left circumflex (LCx): The LCx gives off 2 OM branches proximally then terminates with a large OM. There is 30% disease in a small first OM. There is 30% diffuse disease in the mid to distal LCx.  Right coronary artery (RCA): The RCA is a large dominant vessel. The stents in the proximal, mid, and distal vessel are widely patent.   Left ventriculography: Left ventricular systolic function is abnormal, there is severe hypokinesis of the inferior wall. LVEF is estimated at 35-40%, there is mild to moderate mitral regurgitation   Final Conclusions:  1. Nonobstructive CAD 2. Moderate LV dysfunction with inferior wall motion abnormality. 3. Mild to moderate MR 4. Normal right heart pressures.  Recommendations: Continue medical management. LV filling pressures are low suggesting MR is not severe. Will reduce lasix to daily. Stop amlodipine. Continue Coreg, ACEi, and nitrates. May  consider aldactone as outpatient.    Peter Martinique, [place] 01/12/2015, 12:01 PM   Assessment/Plan:  1. Acute on Chronic Diastolic &  Systolic CHF: H/o Ischemic cardiomyopathy with prior EF of 45% by echo. Repeat echo 02/11/15 showed improved EF at 55% with mild LVH. CXR showed mild pulmonary edema. BP was severely elevated on admit >200. Now improved.  Euvolemic on physical exam. Encouraged to stay on low sodium diet. Keep weighing daily. Instructed to take extra Lasix prn weight gain of 2 or more pounds overnight.   2. CAD: No recent CP. Recent LHC showed patent stents. ECG unremarkable. Mild troponin abnormalities with flat trend 0.04 x 2. Likely due to demand ischemia secondary to acute CHF. Continue ASA 81 and Plavix. Does not tolerate statins.  3. Mitral regurgitation: Mild to moderate on 2D echo from 02/11/15 - will need to be followed.   4. HTN: severe hypertension likely cause of CHF exacerbation. Was >606 systolic on admit. Now better controlled and even low with symptoms noted. Will cut the Coreg back to 3.125 mg BID.   5. HLD - statin intolerant - may need to consider referral to lipid clinic - possible PSK-9 therapy. Will defer for discussion with Dr. Martinique.   Current medicines are reviewed with the patient today.  The patient does not have concerns regarding medicines other than what has been noted above.  The following changes have been made:  See above.  Labs/ tests ordered today include:    Orders Placed This Encounter  Procedures  . Brain natriuretic peptide  . Basic metabolic panel     Disposition:   FU with Dr. Martinique in 6 weeks. Has appointment already in May.    Patient is agreeable to this plan and will call if any problems develop in the interim.   Signed: Burtis Junes, RN, ANP-C 02/23/2015 12:07 PM  [facility] [address] [place], [place]  [address] Phone: [phone] Fax: [phone]

## 2015-02-22 NOTE — Telephone Encounter (Signed)
Spoke with Kenneth [name] last week during his recent hospitalization regarding lipids. He was previously seen by Kenneth [name] in lipid clinic in April of 2015. At that time, patient was going to try to enroll in the SPIRE-2 trial. However, he ended up being excluded from the study d/t an upcoming knee surgery. Now that PCSK9 inhibitors are on the market, patient does not need to be enrolled in a trial to receive drug. Patient has history of ASCVD with CAD s/p PCI x3 in 2014 with LDL 117 on Welchol 625mg  BID. Has a well documented history of statin intolerances to rosuvastatin 5mg , atorvastatin 20mg , pravastatin 20 and 40mg , and simvastatin 20 and 40mg . Zetia was too expensive. Since he is already a patient of Kenneth [name], Kenneth [name] by Gay Filler to referred him to lipid clinic for PCSK9 inhibitor.

## 2015-02-23 ENCOUNTER — Encounter: Payer: Self-pay | Admitting: Nurse Practitioner

## 2015-02-23 ENCOUNTER — Ambulatory Visit (INDEPENDENT_AMBULATORY_CARE_PROVIDER_SITE_OTHER): Payer: Commercial Managed Care - HMO | Admitting: Nurse Practitioner

## 2015-02-23 VITALS — BP 134/78 | HR 73 | Ht 68.0 in | Wt 198.8 lb

## 2015-02-23 DIAGNOSIS — I10 Essential (primary) hypertension: Secondary | ICD-10-CM

## 2015-02-23 DIAGNOSIS — R0600 Dyspnea, unspecified: Secondary | ICD-10-CM | POA: Diagnosis not present

## 2015-02-23 DIAGNOSIS — E785 Hyperlipidemia, unspecified: Secondary | ICD-10-CM | POA: Diagnosis not present

## 2015-02-23 DIAGNOSIS — I5042 Chronic combined systolic (congestive) and diastolic (congestive) heart failure: Secondary | ICD-10-CM | POA: Diagnosis not present

## 2015-02-23 LAB — BASIC METABOLIC PANEL
BUN: 23 mg/dL (ref 6–23)
CO2: 28 mEq/L (ref 19–32)
Calcium: 9.6 mg/dL (ref 8.4–10.5)
Chloride: 103 mEq/L (ref 96–112)
Creatinine, Ser: 0.94 mg/dL (ref 0.40–1.50)
GFR: 85.11 mL/min (ref >60.00)
Glucose, Bld: 114 mg/dL — ABNORMAL HIGH (ref 70–99)
Potassium: 4.7 mEq/L (ref 3.5–5.1)
Sodium: 135 mEq/L (ref 135–145)

## 2015-02-23 LAB — BRAIN NATRIURETIC PEPTIDE: Pro B Natriuretic peptide (BNP): 246 pg/mL — ABNORMAL HIGH (ref 0.0–100.0)

## 2015-02-23 MED ORDER — CARVEDILOL 6.25 MG PO TABS: 3.1250 mg | ORAL_TABLET | Freq: Two times a day (BID) | ORAL | Status: DC

## 2015-02-23 NOTE — Patient Instructions (Addendum)
We will be checking the following labs today - BMET and BNP  Medication Instructions:    Continue with your current medicines but  I am cutting the Coreg back to 3.125 mg twice a day - this is half of your 6.25 mg tab   Testing/Procedures To Be Arranged:  N/A  Follow-Up:   See Dr. Martinique as planned in May     Other Special Instructions:   Weigh daily  If your weight goes up 2 to 3 pounds or more overnight - take an extra dose of Lasix  Keep limiting your salt  Call the [place] office at [phone] if you have any questions, problems or concerns.

## 2015-02-24 ENCOUNTER — Telehealth: Payer: Self-pay | Admitting: Nurse Practitioner

## 2015-02-24 NOTE — Telephone Encounter (Signed)
Patient notified of lab results and notations from Truitt Merle, NP. Patient verbalized understanding and agreement to continue with current treatment plan.

## 2015-02-24 NOTE — Telephone Encounter (Signed)
New message     Want lab results from yesterday

## 2015-02-25 ENCOUNTER — Other Ambulatory Visit: Payer: Self-pay | Admitting: *Deleted

## 2015-03-03 ENCOUNTER — Ambulatory Visit: Payer: Commercial Managed Care - HMO | Admitting: Pharmacist

## 2015-03-10 ENCOUNTER — Ambulatory Visit (INDEPENDENT_AMBULATORY_CARE_PROVIDER_SITE_OTHER): Payer: Commercial Managed Care - HMO | Admitting: Pharmacist

## 2015-03-10 DIAGNOSIS — E785 Hyperlipidemia, unspecified: Secondary | ICD-10-CM | POA: Diagnosis not present

## 2015-03-10 NOTE — Progress Notes (Signed)
Patient is a pleasant 70 y.o. WM referred to lipid clinic by Dr. Martinique. Patient has a h/o CAD / PCI x 3 in 11/2012, HTN, diabetes, and CHF.  He has failed multiple statins due to muscle soreness, and most recently failed Crestor 5 mg once daily (1/2 of 10 mg pills).  His muscle aches resolved with discontinuation of each statin.  He was tried on Zetia but this was too expensive for him.  He is now on Welchol 625mg  BID.  We attempted to enroll him in the [facility] trial but he was not included due to an upcoming knee surgery.   RF:  CAD 11/2012, HTN, Diabetes, age - LDL goal < 70 Meds:  Welchol 625mg  BID  Intolerant:  Zocor 20-40 mg qd, Lipitor  20 mg qd, Pravastatin 20-40 mg qd, Crestor 5 mg qd (1/2 of 10 mg pill daily)- all statins caused myalgias.  Zetia 10mg  daily stopped due to cost.   Labs 12/2014: LDL 117, TC 188, TG 199, HDL 31 (on Welchol 625mg  BID) 11/2012:  LDL 85, TC 142, HDL 34, TG 116 (was on statin daily at that time).  No longer on statin now.  Current Outpatient Prescriptions  Medication Sig Dispense Refill  . allopurinol (ZYLOPRIM) 300 MG tablet Take 300 mg by mouth daily.    Marland Kitchen aspirin EC 81 MG tablet Take 81 mg by mouth daily.    . carvedilol (COREG) 6.25 MG tablet Take 0.5 tablets (3.125 mg total) by mouth 2 (two) times daily. 180 tablet 3  . cetirizine (ZYRTEC) 10 MG tablet Take 10 mg by mouth daily as needed for allergies.     Marland Kitchen clopidogrel (PLAVIX) 75 MG tablet Take 1 tablet by mouth daily.    . colchicine 0.6 MG tablet Take 0.6 mg by mouth daily as needed (Gout).    . colesevelam (WELCHOL) 625 MG tablet Take 1 tablet (625 mg total) by mouth 2 (two) times daily with a meal. 30 tablet 5  . fluticasone (FLONASE) 50 MCG/ACT nasal spray Place 2 sprays into the nose daily as needed for allergies.     . furosemide (LASIX) 40 MG tablet Take 1 tablet (40 mg total) by mouth daily. 30 tablet   . isosorbide mononitrate (IMDUR) 30 MG 24 hr tablet Take 1 tablet (30 mg total) by mouth daily. 30  tablet 5  . KLOR-CON M20 20 MEQ tablet Take 20 mEq by mouth daily.     Marland Kitchen lisinopril (PRINIVIL,ZESTRIL) 40 MG tablet Take 20 mg by mouth every morning.    . Multiple Vitamin (MULTIVITAMIN) tablet Take 1 tablet by mouth daily.    . nitroGLYCERIN (NITROSTAT) 0.4 MG SL tablet Place 1 tablet (0.4 mg total) under the tongue every 5 (five) minutes x 3 doses as needed for chest pain. 25 tablet 2  . Probiotic Product (PROBIOTIC DAILY PO) Take 1 capsule by mouth daily.    Marland Kitchen tetrahydrozoline 0.05 % ophthalmic solution Place 1 drop into both eyes 2 (two) times daily as needed (Itchy eyes).     No current facility-administered medications for this visit.   Allergies  Allergen Reactions  . Crestor [Rosuvastatin]     Muscle aches at Crestor 5 mg daily  . Lipitor [Atorvastatin]     Muscle aches at 20 mg daily   . Pravastatin Sodium     Muscle aches at 20 mg and 40 mg daily  . Zocor [Simvastatin-High Dose]     Muscle aches at 20 mg and 40 mg daily  Family History  Problem Relation Age of Onset  . Arrhythmia Sister     s/p PPM  . Alzheimer's disease Mother   . Hypertension Mother   . Diabetes Mother   . CVA Father    Assessment and Plan  1.  Hyperlipidemia-  Discussed options of starting PCSK-9 with patient now that they are on the market.  He is interested in therapy but concerned about cost and the donut hole.  Will start the benefits investigation and then try to determine what his donut hole cost of his medications may be.  He doesn't work in the summer and so money is more of an issue now.  If he doesn't hit the donut hole until the fall then that may be a better option for him.

## 2015-03-11 ENCOUNTER — Ambulatory Visit (INDEPENDENT_AMBULATORY_CARE_PROVIDER_SITE_OTHER): Payer: Commercial Managed Care - HMO | Admitting: Cardiology

## 2015-03-11 ENCOUNTER — Encounter: Payer: Self-pay | Admitting: Cardiology

## 2015-03-11 ENCOUNTER — Other Ambulatory Visit: Payer: Self-pay

## 2015-03-11 VITALS — BP 100/64 | HR 62 | Ht 68.0 in | Wt 202.2 lb

## 2015-03-11 DIAGNOSIS — I5042 Chronic combined systolic (congestive) and diastolic (congestive) heart failure: Secondary | ICD-10-CM | POA: Diagnosis not present

## 2015-03-11 DIAGNOSIS — I2510 Atherosclerotic heart disease of native coronary artery without angina pectoris: Secondary | ICD-10-CM | POA: Diagnosis not present

## 2015-03-11 DIAGNOSIS — I340 Nonrheumatic mitral (valve) insufficiency: Secondary | ICD-10-CM

## 2015-03-11 DIAGNOSIS — Z9861 Coronary angioplasty status: Secondary | ICD-10-CM

## 2015-03-11 DIAGNOSIS — I10 Essential (primary) hypertension: Secondary | ICD-10-CM | POA: Diagnosis not present

## 2015-03-11 MED ORDER — ISOSORBIDE MONONITRATE ER 30 MG PO TB24: 15.0000 mg | ORAL_TABLET | Freq: Every day | ORAL | Status: DC

## 2015-03-11 NOTE — Patient Outreach (Signed)
[facility]) Care Management  03/11/2015  Kenneth [name] 04/16/1946 [id number]   Telephone call to patient regarding [facility] referral.  Unable to reach patient. HIPAA compliant voice message left with call back phone number.   PLAN: RNCM will attempt 2nd telephone outreach to patient within 3 business day.   Quinn Plowman RN,BSN,CCM [employer] Coordinator [phone]

## 2015-03-11 NOTE — Patient Instructions (Signed)
Continue your sodium restriction  We will reduce isosorbide to 15 mg daily  Continue your other meds.  I will see you in 3 months.

## 2015-03-11 NOTE — Progress Notes (Signed)
CARDIOLOGY OFFICE NOTE  Date:  03/11/2015    Kenneth [name] Date of Birth: 1946-09-29 Medical Record #[id number]  PCP:  Velna Hatchet, MD  Cardiologist:  [place]  Chief Complaint  Patient presents with  . Hospitalization Follow-up    discharge follow up and cath follow up, blood pressure getting low. follow up on heart valve and plan     History of Present Illness: Kenneth [name] is a 69 y.o. male seen for follow up CHF. 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 Other issues include HTN, HLD, tobacco abuse, statin intolerance and DM.  Patient had developed exertional dyspnea earlier this year. Echo in 3/16 showed EF 45% with wall motion abnormalities and moderate-severe  MR. He saw PCP and Lasix was increased to 40 mg bid. He then had right and left heart cath in 3/16 showing nonobstructive CAD (patent stents) and normal filling pressures. MR looked mild to moderate by LV-gram. Lasix was then decreased to 40 mg daily.   On 02/10/15 he was admitted with acute respiratory failure and acute pulmonary edema. BP was very high initially, SBP > 200. He was started on NTG gtt. CXR with pulmonary edema. He was diuresed.Prior Echo was  reviewed by Dr. Aundra Dubin due to concern for MR - the MR was eccentric and likely ischemic (related to wall motion abnormalities with posterior leaflet tethering). Rated moderate to severe with PISA ERO 0.39 cm^2. It was Dr. Claris Gladden suspicion that the MR actually is moderate based on the recent cath. It was possibly felt that rise in BP (such as with pulmonary edema on admission) can make the MR worse and potentiate CHF. Repeat Echo was done on 02/11/15 with mild to moderate MR and EF 55%. He was diuresed and placed on Imdur. Coreg and lisinopril were titrated.  After DC he became hypotensive with BP down to 78 systolic and his Coreg dose was reduced. Since then weight has been stable fluctuating 1-2  lbs. This past weekend weight went up 4 lbs and he took extra lasix with good response. He does feel some fatigue. BP occasionally drops to 95 systolic. He denies any chest pain or dyspnea.  Past Medical History  Diagnosis Date  . Hypertension   . Hyperlipidemia   . Tobacco abuse   . Coronary artery disease     a. 1999: s/p PTCA;  b. 11/2012 Cath/PCI: LM mild plaque, LAD 60-63m, LCX 30p, 54m, OM1 20m, RCA 30-40ost, 40-86m, 80-63m/d (3.0x38, 3.0x32, 3.0x24 Promus Premier DESs), 70-80d, EF 40% inf HK;  c. 12/2012 Staged PCI of LAD w/ 3.0x12 Promus Premier DES.   Marland Kitchen Acute on chronic combined systolic and diastolic CHF (congestive heart failure) 09/18/2013  . Squamous cell carcinoma     Back  . Myocardial infarction     '99- MI,"angio[plasty", Stents 3'14  . H/O seasonal allergies   . Diabetes mellitus without complication     type 2 , no oral meds in 2 yrs.     . Elevated uric acid in blood   . Arthritis     osteoarthritis-knees, hands, wrist, fingers    Past Surgical History  Procedure Laterality Date  . Coronary angioplasty  1999  . Coronary angioplasty with stent placement  01/14/2013    DES to LAD    Dr Rionna Feltes [place]  . Hernia repair  2004 ?  Marland Kitchen Carpal tunnel release Left   . Ankle surgery  1960  . Total  knee arthroplasty Right 06/08/2014    Procedure: RIGHT TOTAL KNEE ARTHROPLASTY;  Surgeon: Gearlean Alf, MD;  Location: [facility];  Service: Orthopedics;  Laterality: Right;  . Left heart catheterization with coronary angiogram N/A 12/27/2012    Procedure: LEFT HEART CATHETERIZATION WITH CORONARY ANGIOGRAM;  Surgeon: Jolaine Artist, MD;  Location: [facility] CATH LAB;  Service: Cardiovascular;  Laterality: N/A;  . Percutaneous coronary stent intervention (pci-s) N/A 12/30/2012    Procedure: PERCUTANEOUS CORONARY STENT INTERVENTION (PCI-S);  Surgeon: Marianela Mandrell M [place], MD;  Location: [facility] CATH LAB;  Service: Cardiovascular;  Laterality: N/A;  . Percutaneous coronary stent intervention (pci-s) N/A  01/14/2013    Procedure: PERCUTANEOUS CORONARY STENT INTERVENTION (PCI-S);  Surgeon: Cheyenna Pankowski M [place], MD;  Location: [facility] CATH LAB;  Service: Cardiovascular;  Laterality: N/A;  . Left and right heart catheterization with coronary angiogram N/A 01/12/2015    Procedure: LEFT AND RIGHT HEART CATHETERIZATION WITH CORONARY ANGIOGRAM;  Surgeon: Jobeth Pangilinan M [place], MD;  Location: [facility] CATH LAB;  Service: Cardiovascular;  Laterality: N/A;     Medications: Current Outpatient Prescriptions  Medication Sig Dispense Refill  . allopurinol (ZYLOPRIM) 300 MG tablet Take 300 mg by mouth daily.    Marland Kitchen aspirin EC 81 MG tablet Take 81 mg by mouth daily.    . carvedilol (COREG) 6.25 MG tablet Take 0.5 tablets (3.125 mg total) by mouth 2 (two) times daily. 180 tablet 3  . cetirizine (ZYRTEC) 10 MG tablet Take 10 mg by mouth daily as needed for allergies.     Marland Kitchen clopidogrel (PLAVIX) 75 MG tablet Take 1 tablet by mouth daily.    . colchicine 0.6 MG tablet Take 0.6 mg by mouth daily as needed (Gout).    . colesevelam (WELCHOL) 625 MG tablet Take 1 tablet (625 mg total) by mouth 2 (two) times daily with a meal. 30 tablet 5  . fluticasone (FLONASE) 50 MCG/ACT nasal spray Place 2 sprays into the nose daily as needed for allergies.     . furosemide (LASIX) 40 MG tablet Take 1 tablet (40 mg total) by mouth daily. 30 tablet   . isosorbide mononitrate (IMDUR) 30 MG 24 hr tablet Take 0.5 tablets (15 mg total) by mouth daily. 30 tablet 5  . KLOR-CON M20 20 MEQ tablet Take 20 mEq by mouth daily.     Marland Kitchen lisinopril (PRINIVIL,ZESTRIL) 40 MG tablet Take 20 mg by mouth every morning.    . Multiple Vitamin (MULTIVITAMIN) tablet Take 1 tablet by mouth daily.    . nitroGLYCERIN (NITROSTAT) 0.4 MG SL tablet Place 1 tablet (0.4 mg total) under the tongue every 5 (five) minutes x 3 doses as needed for chest pain. 25 tablet 2  . Probiotic Product (PROBIOTIC DAILY PO) Take 1 capsule by mouth daily.    Marland Kitchen tetrahydrozoline 0.05 % ophthalmic solution Place  1 drop into both eyes 2 (two) times daily as needed (Itchy eyes).     No current facility-administered medications for this visit.    Allergies: Allergies  Allergen Reactions  . Crestor [Rosuvastatin]     Muscle aches at Crestor 5 mg daily  . Lipitor [Atorvastatin]     Muscle aches at 20 mg daily   . Pravastatin Sodium     Muscle aches at 20 mg and 40 mg daily  . Zocor [Simvastatin-High Dose]     Muscle aches at 20 mg and 40 mg daily    Social History: The patient  reports that he has been smoking Cigars.  He has never  used smokeless tobacco. He reports that he drinks about 3.6 oz of alcohol per week. He reports that he does not use illicit drugs.   Family History: The patient's family history includes Alzheimer's disease in his mother; Arrhythmia in his sister; CVA in his father; Diabetes in his mother; Hypertension in his mother.   Review of Systems: Please see the history of present illness.   Otherwise, the review of systems is positive for lightheadedness.   All other systems are reviewed and negative.   Physical Exam: VS:  BP 100/64 mmHg  Pulse 62  Ht 5\' 8"  (1.727 m)  Wt 202 lb 3.2 oz (91.717 kg)  BMI 30.75 kg/m2 .  BMI Body mass index is 30.75 kg/(m^2).  Weight is up 3 pounds since discharge.   Wt Readings from Last 3 Encounters:  03/11/15 202 lb 3.2 oz (91.717 kg)  02/23/15 198 lb 12.8 oz (90.175 kg)  02/12/15 195 lb 14.4 oz (88.86 kg)    General: Pleasant. Well developed, well nourished and in no acute distress.  HEENT: Normal. Neck: Supple, no JVD, carotid bruits, or masses noted.  Cardiac: Regular rate and rhythm. No murmur heard on auscultation.  No edema.  Respiratory:  Lungs are clear to auscultation bilaterally with normal work of breathing.  GI: Soft and nontender.  MS: No deformity or atrophy. Gait and ROM intact. Skin: Warm and dry. Color is normal.  Neuro:  Strength and sensation are intact and no gross focal deficits noted.  Psych: Alert,  appropriate and with normal affect.   LABORATORY DATA:  EKG:  EKG is not ordered today.  Lab Results  Component Value Date   WBC 7.3 02/10/2015   HGB 11.0* 02/10/2015   HCT 33.1* 02/10/2015   PLT 192 02/10/2015   GLUCOSE 114* 02/23/2015   CHOL 188 01/09/2015   TRIG 199* 01/09/2015   HDL 31* 01/09/2015   LDLCALC 117* 01/09/2015   ALT 47 02/10/2015   AST 37 02/10/2015   NA 135 02/23/2015   K 4.7 02/23/2015   CL 103 02/23/2015   CREATININE 0.94 02/23/2015   BUN 23 02/23/2015   CO2 28 02/23/2015   TSH 1.840 02/10/2015   INR 1.19 01/09/2015   HGBA1C 5.9* 02/10/2015    BNP (last 3 results)  Recent Labs  02/09/15 2335  BNP 770.9*    ProBNP (last 3 results)  Recent Labs  02/23/15 1209  PROBNP 246.0*     Other Studies Reviewed Today:  2D Echo 03-08-2015 Study Conclusions  - Left ventricle: LVEF is approximately 55% with basal inferior and mid posterior hypokinesis. The cavity size was normal. Wall thickness was increased in a pattern of mild LVH. - Mitral valve: There was mild to moderate regurgitation. - Left atrium: The atrium was mildly dilated. - Right atrium: The atrium was mildly dilated.   Echo Study Conclusions from March 2016  - Left ventricle: The cavity size was normal. Wall thickness was increased in a pattern of moderate LVH. The estimated ejection fraction was 45%. Basal to mid inferior akinesis. Inferolateral and anterolateral hypokinesis. Features are consistent with a pseudonormal left ventricular filling pattern, with concomitant abnormal relaxation and increased filling pressure (grade 2 diastolic dysfunction). - Aortic valve: There was no stenosis. - Mitral valve: Mildly calcified annulus. Mildly calcified leaflets . There is tethering of the posterior leaflet. There was moderate to severe regurgitation. Effective regurgitant orifice (PISA): 0.39 cm^2. - Left atrium: The atrium was moderately dilated. - Right  ventricle: The cavity size was  normal. Systolic function was normal. - Tricuspid valve: Peak RV-RA gradient (S): 20 mm Hg. - Pulmonary arteries: PA peak pressure: 23 mm Hg (S). - Inferior vena cava: The vessel was normal in size. The respirophasic diameter changes were in the normal range (= 50%), consistent with normal central venous pressure.  Impressions:  - Normal LV size with moderate LV hypertrophy. EF 45% with basal to mid inferior akinesis, anterolateral and inferolateral hypokinesis. Moderate diastolic dysfunction. 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 Normal RV size and systolic function.  Procedure: Right Heart Cath, Left Heart Cath, Selective Coronary Angiography, LV angiography from 12/2014  Indication: 69 yo WM with remote history of MI in 1999. S/p stenting of the mid LAD and extensive stenting of the RCA in February 2015. He presents now with symptoms of chest tightness and dyspnea. Echo shows EF 40-45% with moderate to severe MR.   Procedural Details: The right groin was prepped, draped, and anesthetized with 1% lidocaine. Using the modified Seldinger technique a 5 Fr sheath was placed in the right femoral artery and a 7 French sheath was placed in the right femoral vein. A Swan-Ganz catheter was used for the right heart catheterization. Standard protocol was followed for recording of right heart pressures and sampling of oxygen saturations. Fick cardiac output was calculated. Standard Judkins catheters were used for selective coronary angiography and left ventriculography. There were no immediate procedural complications. The patient was transferred to the post catheterization recovery area for further monitoring.  Procedural Findings: Hemodynamics RA 8/5 mean 4 mm Hg RV 31/2 mm Hg PA 25/11 mm Hg PCWP 13/11 mean 9 mm Hg LV 113/9 mm Hg AO 115/61 mean 81 mm Hg  Oxygen saturations: PA 67% AO  94%  Cardiac Output (Fick) 5.37 L/min  Cardiac Index (Fick) 2.6 L/min/meter squared  Coronary angiography: Coronary dominance: right  Left mainstem: Normal  Left anterior descending (LAD): There is moderate calcification. In the proximal vessel there is diffuse 30% narrowing. There is a stent in the mid vessel that is widely patent. In the mid to distal vessel there is a focal 40% stenosis. The first diagonal has 40% disease.   Left circumflex (LCx): The LCx gives off 2 OM branches proximally then terminates with a large OM. There is 30% disease in a small first OM. There is 30% diffuse disease in the mid to distal LCx.  Right coronary artery (RCA): The RCA is a large dominant vessel. The stents in the proximal, mid, and distal vessel are widely patent.   Left ventriculography: Left ventricular systolic function is abnormal, there is severe hypokinesis of the inferior wall. LVEF is estimated at 35-40%, there is mild to moderate mitral regurgitation   Final Conclusions:  1. Nonobstructive CAD 2. Moderate LV dysfunction with inferior wall motion abnormality. 3. Mild to moderate MR 4. Normal right heart pressures.  Recommendations: Continue medical management. LV filling pressures are low suggesting MR is not severe. Will reduce lasix to daily. Stop amlodipine. Continue Coreg, ACEi, and nitrates. May consider aldactone as outpatient.    Edahi Kroening [place], [place] 01/12/2015, 12:01 PM   Assessment/Plan:  1. Acute on Chronic Diastolic & Systolic CHF: H/o Ischemic cardiomyopathy with prior EF of 45% by echo. Repeat echo 02/11/15 showed improved EF at 55% with mild LVH. 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  Euvolemic on physical exam. Encouraged to stay on low sodium diet. Keep weighing daily. Instructed  to take extra Lasix prn weight gain of 2 or more pounds overnight.   2. CAD: No recent CP. Recent LHC showed  patent stents.   Continue ASA 81 and Plavix. Does not tolerate statins. Given low BP will reduce Imdur to 15 mg daily.   3. Mitral regurgitation: Mild to moderate on 2D echo from 02/11/15 - will need to be followed.   4. HTN: now well controlled.   5. HLD - statin intolerant - seen in lipid clinic. Being considered for PCSK 9 inhibitor if financially viable.   Current medicines are reviewed with the patient today.  The patient does not have concerns regarding medicines other than what has been noted above.  The following changes have been made:  See above.  Labs/ tests ordered today include:    No orders of the defined types were placed in this encounter.     Disposition:   FU with Dr. Martinique in 3 months.

## 2015-03-15 ENCOUNTER — Ambulatory Visit: Payer: Commercial Managed Care - HMO

## 2015-03-16 ENCOUNTER — Other Ambulatory Visit: Payer: Self-pay

## 2015-03-16 NOTE — Patient Outreach (Signed)
[facility]) Care Management  03/16/2015  Kenneth [name] 11-Feb-1946 [id number]   SUBJECTIVE;  Telephone call to patient regarding [facility] referral.  HIPAA verified with patient.  Discussed and offered [facility] care management services with patient.  Patient refused services. Patient verbally agreed to receive [facility] care management letter/pamphlet for future reference.  PLAN: RNCM will refer to Lurline Del to close due to refusal of servcies. RNCM notified referral source Hettie Holstein of patients refusal. RNCM notified patients primary care provider of refusal.   Quinn Plowman RN,BSN,CCM [place] Coordinator [phone]

## 2015-03-18 ENCOUNTER — Encounter: Payer: Self-pay | Admitting: *Deleted

## 2015-05-06 ENCOUNTER — Telehealth: Payer: Self-pay | Admitting: Pharmacist

## 2015-05-06 MED ORDER — ALIROCUMAB 75 MG/ML ~~LOC~~ SOPN: 75.0000 mg | PEN_INJECTOR | SUBCUTANEOUS | Status: DC

## 2015-05-06 NOTE — Telephone Encounter (Signed)
Called to let pt know that Praluent has been approved.  Will send an Rx to his pharmacy to see what his copay will be.  He will let us know if this is affordable or not.

## 2015-05-11 ENCOUNTER — Observation Stay (HOSPITAL_COMMUNITY): Payer: Commercial Managed Care - HMO

## 2015-05-11 ENCOUNTER — Emergency Department (HOSPITAL_COMMUNITY): Payer: Commercial Managed Care - HMO

## 2015-05-11 ENCOUNTER — Observation Stay (HOSPITAL_COMMUNITY)
Admission: EM | Admit: 2015-05-11 | Discharge: 2015-05-16 | Disposition: A | Discharge status: Home / Self Care | Payer: Commercial Managed Care - HMO | Attending: Cardiology | Admitting: Cardiology

## 2015-05-11 ENCOUNTER — Encounter (HOSPITAL_COMMUNITY): Payer: Self-pay | Admitting: Emergency Medicine

## 2015-05-11 DIAGNOSIS — I5033 Acute on chronic diastolic (congestive) heart failure: Secondary | ICD-10-CM | POA: Diagnosis not present

## 2015-05-11 DIAGNOSIS — Z6830 Body mass index (BMI) 30.0-30.9, adult: Secondary | ICD-10-CM | POA: Insufficient documentation

## 2015-05-11 DIAGNOSIS — K319 Disease of stomach and duodenum, unspecified: Secondary | ICD-10-CM | POA: Diagnosis not present

## 2015-05-11 DIAGNOSIS — Z955 Presence of coronary angioplasty implant and graft: Secondary | ICD-10-CM | POA: Diagnosis not present

## 2015-05-11 DIAGNOSIS — R079 Chest pain, unspecified: Secondary | ICD-10-CM | POA: Diagnosis not present

## 2015-05-11 DIAGNOSIS — E119 Type 2 diabetes mellitus without complications: Secondary | ICD-10-CM | POA: Insufficient documentation

## 2015-05-11 DIAGNOSIS — Z9861 Coronary angioplasty status: Secondary | ICD-10-CM

## 2015-05-11 DIAGNOSIS — M109 Gout, unspecified: Secondary | ICD-10-CM | POA: Insufficient documentation

## 2015-05-11 DIAGNOSIS — F4310 Post-traumatic stress disorder, unspecified: Secondary | ICD-10-CM | POA: Insufficient documentation

## 2015-05-11 DIAGNOSIS — I10 Essential (primary) hypertension: Secondary | ICD-10-CM

## 2015-05-11 DIAGNOSIS — R072 Precordial pain: Secondary | ICD-10-CM | POA: Diagnosis not present

## 2015-05-11 DIAGNOSIS — K2970 Gastritis, unspecified, without bleeding: Secondary | ICD-10-CM | POA: Insufficient documentation

## 2015-05-11 DIAGNOSIS — I5042 Chronic combined systolic (congestive) and diastolic (congestive) heart failure: Secondary | ICD-10-CM | POA: Diagnosis not present

## 2015-05-11 DIAGNOSIS — M159 Polyosteoarthritis, unspecified: Secondary | ICD-10-CM | POA: Diagnosis not present

## 2015-05-11 DIAGNOSIS — R0789 Other chest pain: Secondary | ICD-10-CM | POA: Insufficient documentation

## 2015-05-11 DIAGNOSIS — I472 Ventricular tachycardia: Secondary | ICD-10-CM | POA: Diagnosis not present

## 2015-05-11 DIAGNOSIS — I2584 Coronary atherosclerosis due to calcified coronary lesion: Secondary | ICD-10-CM | POA: Insufficient documentation

## 2015-05-11 DIAGNOSIS — Z7982 Long term (current) use of aspirin: Secondary | ICD-10-CM | POA: Diagnosis not present

## 2015-05-11 DIAGNOSIS — I252 Old myocardial infarction: Secondary | ICD-10-CM | POA: Insufficient documentation

## 2015-05-11 DIAGNOSIS — R0602 Shortness of breath: Secondary | ICD-10-CM | POA: Diagnosis not present

## 2015-05-11 DIAGNOSIS — Z79899 Other long term (current) drug therapy: Secondary | ICD-10-CM | POA: Diagnosis not present

## 2015-05-11 DIAGNOSIS — I2510 Atherosclerotic heart disease of native coronary artery without angina pectoris: Secondary | ICD-10-CM | POA: Diagnosis not present

## 2015-05-11 DIAGNOSIS — F17290 Nicotine dependence, other tobacco product, uncomplicated: Secondary | ICD-10-CM | POA: Insufficient documentation

## 2015-05-11 DIAGNOSIS — E669 Obesity, unspecified: Secondary | ICD-10-CM | POA: Insufficient documentation

## 2015-05-11 DIAGNOSIS — K2990 Gastroduodenitis, unspecified, without bleeding: Secondary | ICD-10-CM

## 2015-05-11 DIAGNOSIS — Z7951 Long term (current) use of inhaled steroids: Secondary | ICD-10-CM | POA: Diagnosis not present

## 2015-05-11 DIAGNOSIS — E785 Hyperlipidemia, unspecified: Secondary | ICD-10-CM | POA: Diagnosis not present

## 2015-05-11 DIAGNOSIS — Z7902 Long term (current) use of antithrombotics/antiplatelets: Secondary | ICD-10-CM | POA: Insufficient documentation

## 2015-05-11 HISTORY — DX: Heart disease, unspecified: I51.9

## 2015-05-11 HISTORY — DX: Post-traumatic stress disorder, unspecified: F43.10

## 2015-05-11 HISTORY — DX: Unspecified osteoarthritis, unspecified site: M19.90

## 2015-05-11 HISTORY — DX: Personal history of other diseases of the musculoskeletal system and connective tissue: Z87.39

## 2015-05-11 HISTORY — DX: Major depressive disorder, single episode, unspecified: F32.9

## 2015-05-11 HISTORY — DX: Atherosclerotic heart disease of native coronary artery without angina pectoris: I25.10

## 2015-05-11 HISTORY — DX: Type 2 diabetes mellitus without complications: E11.9

## 2015-05-11 HISTORY — DX: Chronic diastolic (congestive) heart failure: I50.32

## 2015-05-11 HISTORY — DX: Coronary angioplasty status: Z98.61

## 2015-05-11 LAB — CBC
HCT: 37.1 % — ABNORMAL LOW (ref 39.0–52.0)
Hemoglobin: 12.8 g/dL — ABNORMAL LOW (ref 13.0–17.0)
MCH: 31.8 pg (ref 26.0–34.0)
MCHC: 34.5 g/dL (ref 30.0–36.0)
MCV: 92.3 fL (ref 78.0–100.0)
PLATELETS: 209 10*3/uL (ref 150–400)
RBC: 4.02 MIL/uL — ABNORMAL LOW (ref 4.22–5.81)
RDW: 16.1 % — ABNORMAL HIGH (ref 11.5–15.5)
WBC: 6.6 10*3/uL (ref 4.0–10.5)

## 2015-05-11 LAB — I-STAT TROPONIN, ED: TROPONIN I, POC: 0.02 ng/mL (ref 0.00–0.08)

## 2015-05-11 LAB — BASIC METABOLIC PANEL
Anion gap: 8 (ref 5–15)
BUN: 22 mg/dL — ABNORMAL HIGH (ref 6–20)
CO2: 24 mmol/L (ref 22–32)
CREATININE: 1.09 mg/dL (ref 0.61–1.24)
Calcium: 9.9 mg/dL (ref 8.9–10.3)
Chloride: 103 mmol/L (ref 101–111)
GFR calc Af Amer: >60 mL/min (ref >60)
GFR calc non Af Amer: >60 mL/min (ref >60)
GLUCOSE: 112 mg/dL — AB (ref 65–99)
POTASSIUM: 4 mmol/L (ref 3.5–5.1)
Sodium: 135 mmol/L (ref 135–145)

## 2015-05-11 LAB — TROPONIN I
Troponin I: <0.03 ng/mL (ref <0.031)
Troponin I: 0.03 ng/mL (ref <0.031)

## 2015-05-11 LAB — BRAIN NATRIURETIC PEPTIDE: B Natriuretic Peptide: 595.6 pg/mL — ABNORMAL HIGH (ref 0.0–100.0)

## 2015-05-11 MED ORDER — GI COCKTAIL ~~LOC~~
30.0000 mL | Freq: Once | ORAL | Status: AC
  Administered 2015-05-11: 30 mL via ORAL
  Filled 2015-05-11: qty 30

## 2015-05-11 MED ORDER — ACETAMINOPHEN 325 MG PO TABS: 650.0000 mg | ORAL_TABLET | ORAL | Status: DC | PRN

## 2015-05-11 MED ORDER — NITROGLYCERIN 0.4 MG SL SUBL
0.4000 mg | SUBLINGUAL_TABLET | SUBLINGUAL | Status: DC | PRN
  Administered 2015-05-12: 0.4 mg via SUBLINGUAL
  Filled 2015-05-11: qty 1

## 2015-05-11 MED ORDER — COLESEVELAM HCL 625 MG PO TABS
625.0000 mg | ORAL_TABLET | Freq: Two times a day (BID) | ORAL | Status: DC
  Administered 2015-05-11 – 2015-05-16 (×10): 625 mg via ORAL
  Filled 2015-05-11 (×12): qty 1

## 2015-05-11 MED ORDER — SODIUM CHLORIDE 0.9 % IV SOLN: 250.0000 mL | INTRAVENOUS | Status: DC | PRN

## 2015-05-11 MED ORDER — COLCHICINE 0.6 MG PO TABS: 0.6000 mg | ORAL_TABLET | Freq: Every day | ORAL | Status: DC | PRN

## 2015-05-11 MED ORDER — ISOSORBIDE MONONITRATE ER 30 MG PO TB24
30.0000 mg | ORAL_TABLET | Freq: Every day | ORAL | Status: DC
  Administered 2015-05-11 – 2015-05-13 (×3): 30 mg via ORAL
  Filled 2015-05-11 (×4): qty 1

## 2015-05-11 MED ORDER — LISINOPRIL 20 MG PO TABS
20.0000 mg | ORAL_TABLET | Freq: Every morning | ORAL | Status: DC
  Administered 2015-05-11 – 2015-05-16 (×6): 20 mg via ORAL
  Filled 2015-05-11 (×6): qty 1

## 2015-05-11 MED ORDER — ASPIRIN 81 MG PO CHEW
324.0000 mg | CHEWABLE_TABLET | Freq: Once | ORAL | Status: AC
  Administered 2015-05-11: 324 mg via ORAL
  Filled 2015-05-11: qty 4

## 2015-05-11 MED ORDER — FLUTICASONE PROPIONATE 50 MCG/ACT NA SUSP: 2.0000 | Freq: Every day | NASAL | Status: DC | PRN

## 2015-05-11 MED ORDER — TECHNETIUM TC 99M SESTAMIBI GENERIC - CARDIOLITE
10.0000 | Freq: Once | INTRAVENOUS | Status: AC | PRN
  Administered 2015-05-11: 10 via INTRAVENOUS

## 2015-05-11 MED ORDER — NITROGLYCERIN 2 % TD OINT
1.0000 [in_us] | TOPICAL_OINTMENT | Freq: Once | TRANSDERMAL | Status: AC
  Administered 2015-05-11: 1 [in_us] via TOPICAL
  Filled 2015-05-11: qty 1

## 2015-05-11 MED ORDER — TECHNETIUM TC 99M SESTAMIBI GENERIC - CARDIOLITE
30.0000 | Freq: Once | INTRAVENOUS | Status: AC | PRN
  Administered 2015-05-11: 30 via INTRAVENOUS

## 2015-05-11 MED ORDER — FUROSEMIDE 40 MG PO TABS
40.0000 mg | ORAL_TABLET | Freq: Every day | ORAL | Status: DC
  Administered 2015-05-12 – 2015-05-16 (×5): 40 mg via ORAL
  Filled 2015-05-11 (×5): qty 1

## 2015-05-11 MED ORDER — ASPIRIN EC 81 MG PO TBEC
81.0000 mg | DELAYED_RELEASE_TABLET | Freq: Every day | ORAL | Status: DC
  Administered 2015-05-11 – 2015-05-16 (×6): 81 mg via ORAL
  Filled 2015-05-11 (×6): qty 1

## 2015-05-11 MED ORDER — ONDANSETRON HCL 4 MG/2ML IJ SOLN: 4.0000 mg | Freq: Four times a day (QID) | INTRAMUSCULAR | Status: DC | PRN

## 2015-05-11 MED ORDER — FAMOTIDINE 20 MG PO TABS
20.0000 mg | ORAL_TABLET | Freq: Two times a day (BID) | ORAL | Status: DC
  Administered 2015-05-11 – 2015-05-12 (×4): 20 mg via ORAL
  Filled 2015-05-11 (×6): qty 1

## 2015-05-11 MED ORDER — FUROSEMIDE 10 MG/ML IJ SOLN
20.0000 mg | Freq: Once | INTRAMUSCULAR | Status: AC
  Administered 2015-05-11: 20 mg via INTRAVENOUS
  Filled 2015-05-11: qty 2

## 2015-05-11 MED ORDER — SODIUM CHLORIDE 0.9 % IJ SOLN
3.0000 mL | Freq: Two times a day (BID) | INTRAMUSCULAR | Status: DC
  Administered 2015-05-11 – 2015-05-16 (×9): 3 mL via INTRAVENOUS

## 2015-05-11 MED ORDER — CARVEDILOL 3.125 MG PO TABS
3.1250 mg | ORAL_TABLET | Freq: Two times a day (BID) | ORAL | Status: DC
  Administered 2015-05-11 – 2015-05-14 (×8): 3.125 mg via ORAL
  Filled 2015-05-11 (×8): qty 1

## 2015-05-11 MED ORDER — SODIUM CHLORIDE 0.9 % IJ SOLN
3.0000 mL | INTRAMUSCULAR | Status: DC | PRN
  Administered 2015-05-11: 15:00:00 via INTRAVENOUS
  Administered 2015-05-11: 3 mL via INTRAVENOUS
  Filled 2015-05-11 (×2): qty 3

## 2015-05-11 MED ORDER — ALLOPURINOL 300 MG PO TABS
300.0000 mg | ORAL_TABLET | Freq: Every day | ORAL | Status: DC
  Administered 2015-05-11 – 2015-05-16 (×6): 300 mg via ORAL
  Filled 2015-05-11 (×6): qty 1

## 2015-05-11 MED ORDER — MORPHINE SULFATE 4 MG/ML IJ SOLN
4.0000 mg | Freq: Once | INTRAMUSCULAR | Status: AC
Start: 2015-05-11 — End: 2015-05-11
  Administered 2015-05-11: 4 mg via INTRAVENOUS
  Filled 2015-05-11 (×2): qty 1

## 2015-05-11 MED ORDER — CLOPIDOGREL BISULFATE 75 MG PO TABS
75.0000 mg | ORAL_TABLET | Freq: Every day | ORAL | Status: DC
  Administered 2015-05-11 – 2015-05-16 (×6): 75 mg via ORAL
  Filled 2015-05-11 (×6): qty 1

## 2015-05-11 MED ORDER — POTASSIUM CHLORIDE CRYS ER 20 MEQ PO TBCR
20.0000 meq | EXTENDED_RELEASE_TABLET | Freq: Every day | ORAL | Status: DC
  Administered 2015-05-11 – 2015-05-16 (×6): 20 meq via ORAL
  Filled 2015-05-11 (×6): qty 1

## 2015-05-11 NOTE — ED Provider Notes (Signed)
CSN: [id number]     Arrival date & time 05/11/15  0212 History   This chart was scribed for Merryl Hacker, MD by Forrestine Him, ED Scribe. This patient was seen in room A13C/A13C and the patient's care was started 2:33 AM.     Chief Complaint  Patient presents with  . Chest Pain   The history is provided by the patient. No language interpreter was used.    HPI Comments: Kenneth [name] is a 69 y.o. male with a PMHx of HTN, hyperlipidemia, MI-1999, and DM who presents to the Emergency Department complaining of intermittent, ongoing R sided chest pain onset 3 PM this afternoon. Pain is described as tightness. No aggravating factors but states discomfort is mildly alleviated after belching. He also reports diaphoresis and mild shortness of breath. Reports that his pain is similar to prior MI. OTC Tums and 81 mg baby ASA attempted prior to arrival. No Nitro attempted today. No recent fever, chills, nausea, vomiting, or abdominal pain. Marland Kitchen He is followed by Dr. Jacqualine Mau.  Patient recently had Imdur decreased for hypotension issues. PSHx includes coronary angioplasty-1999, Coronary angioplasty with stent placement-2014, Left heart catheterization with coronary angiogram-2014, and Left and right heart catheterization with coronary angiogram.  Past Medical History  Diagnosis Date  . Hypertension   . Hyperlipidemia   . Tobacco abuse   . Coronary artery disease     a. 1999: s/p PTCA;  b. 11/2012 Cath/PCI: LM mild plaque, LAD 60-15m, LCX 30p, 39m, OM1 82m, RCA 30-40ost, 40-58m, 80-69m/d (3.0x38, 3.0x32, 3.0x24 Promus Premier DESs), 70-80d, EF 40% inf HK;  c. 12/2012 Staged PCI of LAD w/ 3.0x12 Promus Premier DES.   Marland Kitchen Acute on chronic combined systolic and diastolic CHF (congestive heart failure) 09/18/2013  . Squamous cell carcinoma     Back  . Myocardial infarction     '99- MI,"angio[plasty", Stents 3'14  . H/O seasonal allergies   . 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     . Elevated uric acid in blood   . Arthritis     osteoarthritis-knees, hands, wrist, fingers   Past Surgical History  Procedure Laterality Date  . Coronary angioplasty  1999  . Coronary angioplasty with stent placement  01/14/2013    DES to LAD    Dr Peter Martinique  . Hernia repair  2004 ?  Marland Kitchen Carpal tunnel release Left   . Ankle surgery  1960  . Total knee arthroplasty Right 06/08/2014    Procedure: RIGHT TOTAL KNEE ARTHROPLASTY;  Surgeon: Gearlean Alf, MD;  Location: [facility]S;  Service: Orthopedics;  Laterality: Right;  . Left heart catheterization with coronary angiogram N/A 12/27/2012    Procedure: LEFT HEART CATHETERIZATION WITH CORONARY ANGIOGRAM;  Surgeon: Jolaine Artist, MD;  Location: [facility] CATH LAB;  Service: Cardiovascular;  Laterality: N/A;  . Percutaneous coronary stent intervention (pci-s) N/A 12/30/2012    Procedure: PERCUTANEOUS CORONARY STENT INTERVENTION (PCI-S);  Surgeon: Peter M Martinique, MD;  Location: [facility] CATH LAB;  Service: Cardiovascular;  Laterality: N/A;  . Percutaneous coronary stent intervention (pci-s) N/A 01/14/2013    Procedure: PERCUTANEOUS CORONARY STENT INTERVENTION (PCI-S);  Surgeon: Peter M Martinique, MD;  Location: [facility] CATH LAB;  Service: Cardiovascular;  Laterality: N/A;  . Left and right heart catheterization with coronary angiogram N/A 01/12/2015    Procedure: LEFT AND RIGHT HEART CATHETERIZATION WITH CORONARY ANGIOGRAM;  Surgeon: Peter M Martinique, MD;  Location: [facility]  CATH LAB;  Service: Cardiovascular;  Laterality: N/A;   Family History  Problem Relation Age of Onset  . Arrhythmia Sister     s/p PPM  . Alzheimer's disease Mother   . Hypertension Mother   . Diabetes Mother   . CVA Father    History  Substance Use Topics  . Smoking status: Light Tobacco Smoker -- 15 years    Types: Cigars    Last Attempt to Quit: 09/02/2013  . Smokeless tobacco: Never Used     Comment: rare ciagar on golf days-variable days 1-2 per month  .  Alcohol Use: 3.6 oz/week    6 Cans of beer per week     Comment: 6-pack of beer/weekend    Review of Systems  Constitutional: Positive for diaphoresis. Negative for fever and chills.  Respiratory: Positive for shortness of breath.   Cardiovascular: Positive for chest pain.  Gastrointestinal: Negative for nausea, vomiting and abdominal pain.  Neurological: Negative for dizziness, weakness and light-headedness.  Psychiatric/Behavioral: Negative for confusion.  All other systems reviewed and are negative.     Allergies  Crestor; Lipitor; Pravastatin sodium; and Zocor  Home Medications   Prior to Admission medications   Medication Sig Start Date End Date Taking? Authorizing Provider  allopurinol (ZYLOPRIM) 300 MG tablet Take 300 mg by mouth daily.   Yes Historical Provider, MD  aspirin EC 81 MG tablet Take 81 mg by mouth daily.   Yes Historical Provider, MD  carvedilol (COREG) 6.25 MG tablet Take 0.5 tablets (3.125 mg total) by mouth 2 (two) times daily. 02/23/15  Yes Burtis Junes, NP  cetirizine (ZYRTEC) 10 MG tablet Take 10 mg by mouth daily as needed for allergies.    Yes Historical Provider, MD  clopidogrel (PLAVIX) 75 MG tablet Take 75 mg by mouth daily.  07/04/14  Yes Historical Provider, MD  colchicine 0.6 MG tablet Take 0.6 mg by mouth daily as needed (Gout).   Yes Historical Provider, MD  colesevelam (WELCHOL) 625 MG tablet Take 1 tablet (625 mg total) by mouth 2 (two) times daily with a meal. 01/10/15  Yes Brett Canales, PA-C  fluticasone (FLONASE) 50 MCG/ACT nasal spray Place 2 sprays into the nose daily as needed for allergies.  12/31/12  Yes Ripudeep Krystal Eaton, MD  furosemide (LASIX) 40 MG tablet Take 1 tablet (40 mg total) by mouth daily. 01/12/15  Yes Peter M Martinique, MD  isosorbide mononitrate (IMDUR) 30 MG 24 hr tablet Take 0.5 tablets (15 mg total) by mouth daily. 03/11/15  Yes Peter M Martinique, MD  KLOR-CON M20 20 MEQ tablet Take 20 mEq by mouth daily.  02/12/15  Yes Historical  Provider, MD  lisinopril (PRINIVIL,ZESTRIL) 40 MG tablet Take 20 mg by mouth every morning.   Yes Historical Provider, MD  Multiple Vitamin (MULTIVITAMIN) tablet Take 1 tablet by mouth daily.   Yes Historical Provider, MD  nitroGLYCERIN (NITROSTAT) 0.4 MG SL tablet Place 1 tablet (0.4 mg total) under the tongue every 5 (five) minutes x 3 doses as needed for chest pain. 01/10/15  Yes Erlene Quan, PA-C  Probiotic Product (PROBIOTIC DAILY PO) Take 1 capsule by mouth daily.   Yes Historical Provider, MD  tetrahydrozoline 0.05 % ophthalmic solution Place 1 drop into both eyes 2 (two) times daily as needed (Itchy eyes).   Yes Historical Provider, MD  Alirocumab (PRALUENT) 75 MG/ML SOPN Inject 75 mg into the skin every 14 (fourteen) days. Patient not taking: Reported on 05/11/2015 05/06/15   [name]  M Martinique, MD   Triage Vitals: BP 117/65 mmHg  Pulse 58  Temp(Src) 98.3 F (36.8 C)  Resp 13  Ht 5\' 8"  (1.727 m)  Wt 203 lb (92.08 kg)  BMI 30.87 kg/m2  SpO2 97%   Physical Exam  Constitutional: He is oriented to person, place, and time. He appears well-developed and well-nourished. No distress.  HENT:  Head: Normocephalic and atraumatic.  Cardiovascular: Normal rate, regular rhythm and normal heart sounds.   No murmur heard. Pulmonary/Chest: Effort normal and breath sounds normal. No respiratory distress. He has no wheezes.  Midline sternotomy scar  Abdominal: Soft. Bowel sounds are normal. There is no tenderness. There is no rebound.  Musculoskeletal: He exhibits no edema.  Trace lower extremity edema  Neurological: He is alert and oriented to person, place, and time.  Skin: Skin is warm and dry.  Psychiatric: He has a normal mood and affect.  Nursing note and vitals reviewed.   ED Course  Procedures (including critical care time)  DIAGNOSTIC STUDIES: Oxygen Saturation is 99% on RA, Normal by my interpretation.    COORDINATION OF CARE: 2:42 AM- Will order BMP, CBC, CXR, EKG, i-stat  troponin I, and BNP. Will give ASA chewable and Nitrogyn. Discussed treatment plan with pt at bedside and pt agreed to plan.     Labs Review Labs Reviewed  BASIC METABOLIC PANEL - Abnormal; Notable for the following:    Glucose, Bld 112 (*)    BUN 22 (*)    All other components within normal limits  CBC - Abnormal; Notable for the following:    RBC 4.02 (*)    Hemoglobin 12.8 (*)    HCT 37.1 (*)    RDW 16.1 (*)    All other components within normal limits  BRAIN NATRIURETIC PEPTIDE - Abnormal; Notable for the following:    B Natriuretic Peptide 595.6 (*)    All other components within normal limits  TROPONIN I  TROPONIN I  I-STAT TROPOININ, ED    Imaging Review Dg Chest 2 View  05/11/2015   CLINICAL DATA:  Right-sided chest pain and shortness of breath tonight.  EXAM: CHEST  2 VIEW  COMPARISON:  02/10/2015  FINDINGS: Borderline heart size and pulmonary vascular congestion demonstrating improvement since previous study. No focal airspace disease or edema. No consolidation. No blunting of costophrenic angles. No pneumothorax. Mild hyperinflation. Mediastinal contours appear intact. Degenerative changes in the shoulders.  IMPRESSION: Improving congestive changes without significant edema or consolidation.   Electronically Signed   By: Lucienne Capers M.D.   On: 05/11/2015 03:46     EKG Interpretation   Date/Time:  Tuesday May 11 2015 02:16:01 EDT Ventricular Rate:  72 PR Interval:  148 QRS Duration: 100 QT Interval:  416 QTC Calculation: 455 R Axis:   88 Text Interpretation:  Normal sinus rhythm Normal ECG No significant change  since last tracing Confirmed by HORTON  MD, COURTNEY ([id number]) on 05/11/2015  2:26:48 AM      MDM   Final diagnoses:  None     Patient presents with chest pain. Nontoxic on exam. There is somewhat of a GI component given the belching. He is nontoxic. Does report that his pain is similar to prior MIs and could represent unstable angina. No evidence  of significant volume overload. EKG is nonischemic. Chest x-ray is reassuring. Initial lab work including troponin is negative. Discussed with Dr. Lovena Le overnight. Patient is continuing to have pain after nitroglycerin. Patient was given GI cocktail. Per Dr.  Lovena Le, will cycle one set of enzymes and follow-up with her when resulted. Patient not improve with GI cocktail. Patient given morphine with some improvement.  Unable to get in touch with cardiology this am.  Continuing to attempt.  Patient is painfree and has been updated.  Signed out to Dr. Regenia Skeeter.  I personally performed the services described in this documentation, which was scribed in my presence. The recorded information has been reviewed and is accurate.   Merryl Hacker, MD 05/11/15 2300

## 2015-05-11 NOTE — ED Notes (Signed)
Pt. reports intermittent right chest pain with mild SOB and diaphoresis onset this afternoon , denies nausea or cough , history of CAD / CHF .

## 2015-05-11 NOTE — ED Notes (Signed)
Pt states that he does not want anything else for pain.

## 2015-05-11 NOTE — ED Provider Notes (Signed)
8:10 AM discussed the patient's case with Dr. Ellyn Hack, patient is currently pain-free, he will evaluate to see if patient can get a stress test today versus close outpatient follow-up.  9:01 AM Dr. Ellyn Hack to admit to obs and get stress test  Sherwood Gambler, MD 05/11/15 [phone]

## 2015-05-11 NOTE — Progress Notes (Signed)
Pt having continued right chest dull pressure 2.5/10, without dyspnea or diaphoresis. He is belching loudly. Pt thinks this may be related to getting his daily meds late. Discussed with Almyra Deforest, PA. SL NTG offered to pt and he declines at present. Will continue to monitor.

## 2015-05-11 NOTE — Progress Notes (Signed)
1 day Treadmill Nuc completed without complication, pending final result by [facility]  Signed, Almyra Deforest PA Pager: [phone]

## 2015-05-11 NOTE — H&P (Signed)
CARDIOLOGY ADMISSION HISTORY & PHYSICAL NOTE.  NAME:  Kenneth [name]   MRN: [id number] DOB:  16-Oct-1946   ADMIT DATE: 05/11/2015  Reason for ADMISSION: Chest Pain   Requesting Physician: Dr. Dina Rich (EDP)  Primary Cardiologist: Dr.Peter Martinique  HPI: This is a 69 y.o. male with a past medical history significant for long-standing CAD dating back to 1999. He had PTCA done at that time. Since then he had PCI to the LAD and RCA in February and March 2014. Most recent cardiac catheterization was in March of this year due to concern for possible ischemic mitral regurgitation that showed mild to moderate CAD with patent stents. Normal right heart pressures.   He has had 2 episodes this year in March and April that appeared to have been related to acute on chronic diastolic heart failure. He was evaluated for what was concerning for mild to severe MR that turned out to likely be functional as noted above.  He was seen last by Dr. Martinique in May of this year. Apparently following his discharge in April he became hypotensive and his Coreg dose was reduced. He was on standing Lasix 40 mg a day with when necessary additional 40 mg. Also on a low-salt diet. He has been maintaining his body weight within a 1-2 pound bracket. His Imdur dose was reduced to 15 mg at that time due to hypotension.  He was doing well on his usual state of health until yesterday afternoon roughly 3:00. He started noticing a discomfort in his substernal area that was similar to his previous anginal symptoms. He described it as a tight squeezing sensation that lasted most of the evening intermittently. He did notice some gas and belching sensation as well, but also noted feeling clammy with sweaty palms which is unusual for him. He did not notice profound dyspnea like he did in the spring, but did note a sensation of orthopnea with almost worsening of his chest discomfort with lying down which would be potentially consistent with angina  decubitus.  His symptoms progressed to the point where he became concerned as there was no relief with sublingual nitroglycerin. He then came to the emergency room early this morning. He has had 2 sets of cardiac markers which show no evidence of MI. His EKG is relatively benign.  Cardiovascular ROS: positive for - chest pain, orthopnea, shortness of breath and Diaphoresis negative for - edema, irregular heartbeat, loss of consciousness, palpitations, paroxysmal nocturnal dyspnea, rapid heart rate or Syncope/near syncope, TIA/amaurosis fugax. Melanoma, hematochezia, hematuria. Epistaxis   PMHx:  CARDIAC HISTORY:  Echo:    March 2016: EF 45%, basal to mid inferior akinesis and inferolateral to anterolateral hypokinesis. Grade 2 diastolic dysfunction. 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  02/11/2015: EF 55% with basal inferior and mild posterior hypokinesis. Mild LVH. Mild-moderate MR. Mild LA and RA dilation.  R&L Heart Cath 01/12/2015:  Procedural Findings: Hemodynamics: RA 8/5 mean 4 mm Hg; RV 31/2 mm Hg; PA 25/11 mm Hg; PCWP 13/11 mean 9 mm Hg; LV 113/9 mm Hg; AO 115/61 mean 81 mm Hg  Oxygen saturations: PA 67%; AO 94%  Cardiac Output ;  Index (Fick) 5.37 L/min ; 2.6 L/min/m2   LM : Normal --> LAD: mod Ca2+, pLAD 30%, mLAD Stentwidely patent\, m-dLAD ~40%   Cx:2 OMB, terminates as large OM3.  30% small OM1 & m-d Cx RCA: Large, dominant. Prox, mid & distal stents patent. EF ~35-40% - mild-mod MR.   Past Medical History  Diagnosis Date  . Hypertension   . Hyperlipidemia   . Tobacco abuse   . CAD S/P percutaneous coronary angioplasty 1999; 2014    a. 1999: s/p PTCA;  b. 11/2012 Cath/PCI: LM mild plaque, LAD 60-80m, LCX 30p, 45m, OM1 69m, RCA 30-40ost, 40-17m, 80-58m/d (3.0x38, 3.0x32, 3.0x24 Promus Premier DESs), 70-80d, EF 40% inf HK;  c. 12/2012 Staged PCI of LAD w/ 3.0x12 Promus Premier DES. d. Cath 3/'16: patent stents, mild LAD & OM3 disease,  normal RHC Pressures, EF 35-40%  . Chronic diastolic heart failure, NYHA class 2 09/18/2013  . Squamous cell carcinoma     Back  . Myocardial infarction     '99- MI,"angioplasty", Stents 2& 3'14  . H/O seasonal allergies   . Diabetes mellitus without complication     type 2 , no oral meds in 2 yrs.     . Elevated uric acid in blood   . Arthritis     osteoarthritis-knees, hands, wrist, fingers   Past Surgical History  Procedure Laterality Date  . Coronary angioplasty  1999  . Hernia repair  2004 ?  Marland Kitchen Carpal tunnel release Left   . Ankle surgery  1960  . Total knee arthroplasty Right 06/08/2014    Procedure: RIGHT TOTAL KNEE ARTHROPLASTY;  Surgeon: Gearlean Alf, MD;  Location: [facility] ORS;  Service: Orthopedics;  Laterality: Right;  . Left heart catheterization with coronary angiogram N/A 12/27/2012    Procedure: LEFT HEART CATHETERIZATION WITH CORONARY ANGIOGRAM;  Surgeon: Jolaine Artist, MD;  Location: [facility] CATH LAB;  Service: Cardiovascular;  LM mild plaque, LAD 60-58m, LCX 30p, 6m, OM1 85m, RCA 30-40ost, 40-21m, 80-29m/d , 70-80d, EF 40%  . Percutaneous coronary stent intervention (pci-s) N/A 12/30/2012    Procedure: PERCUTANEOUS CORONARY STENT INTERVENTION (PCI-S) - RCA;  Surgeon: Peter M Martinique, MD;  Location: [facility] CATH LAB;  Service: Cardiovascular; 3.0x38, 3.0x32, 3.0x24 Promus Premier DESs   . Percutaneous coronary stent intervention (pci-s) N/A 01/14/2013    PERCUTANEOUS CORONARY STENT INTERVENTION (PCI-S);  Surgeon: Peter M Martinique, MD;  Location: [facility] CATH LAB;  Service: Cardiovascular;  Staged PCI of LAD w/ 3.0x12 Promus Premier DES.  Marland Kitchen Left and right heart catheterization with coronary angiogram N/A 01/12/2015    LEFT AND RIGHT HEART CATHETERIZATION WITH CORONARY ANGIOGRAM;  Surgeon: Peter M Martinique, MD;  Location: [facility] CATH LAB;  Service: Cardiovascular;  Normal Right Heart Pressures. CO/CI (Fick) 5.27 L/min, 2.6 L/min/m2; LAD: mod Ca2+, pLAD 30%, mLAD Stentwidely patent\, m-dLAD  ~40%;Cx:2 OMB, terminates as large OM3.  30% small OM1 & m-d Cx;Large, dominant. Prox, mid & distal stents patent ; EF ~35-40%  . Transthoracic echocardiogram  3/15/'216; 4/14/'16    a. 3/'16: EF 45%, basal to mid inferior akinesis and inferolateral to anterolateral hypokinesis. Grade 2 DD. 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 4/'16: EF 55% with basal inferior and mild posterior HK, Mild LVH. Mild-mod MR. Mild LA and RA dilation     FAMHx: Family History  Problem Relation Age of Onset  . Arrhythmia Sister     s/p PPM  . Alzheimer's disease Mother   . Hypertension Mother   . Diabetes Mother   . CVA Father     SOCHx:  reports that he has been smoking Cigars.  He has never used smokeless tobacco. He reports that he drinks about 3.6 oz of alcohol per week. He reports that he does not use illicit drugs.  ALLERGIES: Allergies  Allergen Reactions  . Crestor [Rosuvastatin]  Muscle aches at Crestor 5 mg daily  . Lipitor [Atorvastatin]     Muscle aches at 20 mg daily   . Pravastatin Sodium     Muscle aches at 20 mg and 40 mg daily  . Zocor [Simvastatin-High Dose]     Muscle aches at 20 mg and 40 mg daily    ROS: A comprehensive review of systems was performed. Pertinent positives noted above.:  \Review of Systems  Constitutional: Positive for diaphoresis (Sweaty cool palms and feet).  Eyes: Negative for blurred vision.  Respiratory: Positive for shortness of breath. Negative for cough and wheezing.   Cardiovascular: Negative for claudication. Orthopnea: With exacerbating chest discomfort.       Per history of present illness  Gastrointestinal: Positive for heartburn (He's not sure if this is heartburn or chest pain. But he has noted belching). Negative for abdominal pain and constipation.  Genitourinary: Negative for hematuria.  Musculoskeletal: Positive for myalgias (He has noticed worsening calf cramping in the mornings). Negative for joint pain.    Neurological: Negative for dizziness and headaches.  Endo/Heme/Allergies: Does not bruise/bleed easily.  Psychiatric/Behavioral: The patient is nervous/anxious.   All other systems reviewed and are negative.   HOME MEDICATIONS: No current facility-administered medications on file prior to encounter.   Current Outpatient Prescriptions on File Prior to Encounter  Medication Sig Dispense Refill  . allopurinol (ZYLOPRIM) 300 MG tablet Take 300 mg by mouth daily.    Marland Kitchen aspirin EC 81 MG tablet Take 81 mg by mouth daily.    . carvedilol (COREG) 6.25 MG tablet Take 0.5 tablets (3.125 mg total) by mouth 2 (two) times daily. 180 tablet 3  . cetirizine (ZYRTEC) 10 MG tablet Take 10 mg by mouth daily as needed for allergies.     Marland Kitchen clopidogrel (PLAVIX) 75 MG tablet Take 75 mg by mouth daily.     . colchicine 0.6 MG tablet Take 0.6 mg by mouth daily as needed (Gout).    . colesevelam (WELCHOL) 625 MG tablet Take 1 tablet (625 mg total) by mouth 2 (two) times daily with a meal. 30 tablet 5  . fluticasone (FLONASE) 50 MCG/ACT nasal spray Place 2 sprays into the nose daily as needed for allergies.     . furosemide (LASIX) 40 MG tablet Take 1 tablet (40 mg total) by mouth daily. 30 tablet   . isosorbide mononitrate (IMDUR) 30 MG 24 hr tablet Take 0.5 tablets (15 mg total) by mouth daily. 30 tablet 5  . KLOR-CON M20 20 MEQ tablet Take 20 mEq by mouth daily.     Marland Kitchen lisinopril (PRINIVIL,ZESTRIL) 40 MG tablet Take 20 mg by mouth every morning.    . Multiple Vitamin (MULTIVITAMIN) tablet Take 1 tablet by mouth daily.    . nitroGLYCERIN (NITROSTAT) 0.4 MG SL tablet Place 1 tablet (0.4 mg total) under the tongue every 5 (five) minutes x 3 doses as needed for chest pain. 25 tablet 2  . Probiotic Product (PROBIOTIC DAILY PO) Take 1 capsule by mouth daily.    Marland Kitchen tetrahydrozoline 0.05 % ophthalmic solution Place 1 drop into both eyes 2 (two) times daily as needed (Itchy eyes).    . Alirocumab (PRALUENT) 75 MG/ML SOPN  Inject 75 mg into the skin every 14 (fourteen) days. (Patient not taking: Reported on 05/11/2015) 2 pen 3    VITALS: Blood pressure 146/76, pulse 67, temperature 98.3 F (36.8 C), resp. rate 10, height 5\' 8"  (1.727 m), weight 92.08 kg (203 lb), SpO2 98 %.  PHYSICAL EXAM: General appearance: alert, cooperative, appears stated age, no distress, mildly obese and Pleasant mood and affect. Nontoxic appearing. Currently mild residual substernal discomfort Neck: no adenopathy, no carotid bruit, no JVD, supple, symmetrical, trachea midline and thyroid not enlarged, symmetric, no tenderness/mass/nodules Lungs: clear to auscultation bilaterally, normal percussion bilaterally and Nonlabored, good air movement Heart: normal apical impulse, regular rate and rhythm, S1, S2 normal, no S3 or S4, no click, no rub and 4-1/6 HSM at apex. Abdomen: soft, non-tender; bowel sounds normal; no masses,  no organomegaly Extremities: extremities normal, atraumatic, no cyanosis or edema and Hands and feet are sweaty and cool Pulses: 2+ and symmetric Skin: Skin color, texture, turgor normal. No rashes or lesions Neurologic: Mental status: Alert, oriented, thought content appropriate, affect: normal Cranial nerves: normal  LABS: Results for orders placed or performed during the hospital encounter of 05/11/15 (from the past 24 hour(s))  Basic metabolic panel     Status: Abnormal   Collection Time: 05/11/15  2:27 AM  Result Value Ref Range   Sodium 135 135 - 145 mmol/L   Potassium 4.0 3.5 - 5.1 mmol/L   Chloride 103 101 - 111 mmol/L   CO2 24 22 - 32 mmol/L   Glucose, Bld 112 (H) 65 - 99 mg/dL   BUN 22 (H) 6 - 20 mg/dL   Creatinine, Ser 1.09 0.61 - 1.24 mg/dL   Calcium 9.9 8.9 - 10.3 mg/dL   GFR calc non Af Amer >60 >60 mL/min   GFR calc Af Amer >60 >60 mL/min   Anion gap 8 5 - 15  CBC     Status: Abnormal   Collection Time: 05/11/15  2:27 AM  Result Value Ref Range   WBC 6.6 4.0 - 10.5 K/uL   RBC 4.02 (L) 4.22  - 5.81 MIL/uL   Hemoglobin 12.8 (L) 13.0 - 17.0 g/dL   HCT 37.1 (L) 39.0 - 52.0 %   MCV 92.3 78.0 - 100.0 fL   MCH 31.8 26.0 - 34.0 pg   MCHC 34.5 30.0 - 36.0 g/dL   RDW 16.1 (H) 11.5 - 15.5 %   Platelets 209 150 - 400 K/uL  Troponin I     Status: None   Collection Time: 05/11/15  2:27 AM  Result Value Ref Range   Troponin I <0.03 <0.031 ng/mL  Brain natriuretic peptide     Status: Abnormal   Collection Time: 05/11/15  2:27 AM  Result Value Ref Range   B Natriuretic Peptide 595.6 (H) 0.0 - 100.0 pg/mL  I-stat troponin, ED     Status: None   Collection Time: 05/11/15  2:40 AM  Result Value Ref Range   Troponin i, poc 0.02 0.00 - 0.08 ng/mL   Comment 3          Troponin I     Status: None   Collection Time: 05/11/15  5:23 AM  Result Value Ref Range   Troponin I <0.03 <0.031 ng/mL    IMAGING: Dg Chest 2 View  05/11/2015   CLINICAL DATA:  Right-sided chest pain and shortness of breath tonight.  EXAM: CHEST  2 VIEW  COMPARISON:  02/10/2015  FINDINGS: Borderline heart size and pulmonary vascular congestion demonstrating improvement since previous study. No focal airspace disease or edema. No consolidation. No blunting of costophrenic angles. No pneumothorax. Mild hyperinflation. Mediastinal contours appear intact. Degenerative changes in the shoulders.  IMPRESSION: Improving congestive changes without significant edema or consolidation.   Electronically Signed   By: Lucienne Capers  M.D.   On: 05/11/2015 03:46   EKG:  NSR, 72 bp, Normal Axis, intervals, durations & voltage. -- Normal EKG (no notable change from 4/16)  IMPRESSION: Principal Problem:   Chest pain with moderate risk of acute coronary syndrome Active Problems:   CAD S/P percutaneous coronary angioplasty   Chronic combined systolic and diastolic CHF (congestive heart failure)   Essential hypertension: Labile   Hyperlipidemia   Acute on chronic diastolic congestive heart failure  Mr. Yardley has symptoms that are  somewhat concerning for possible acute coronary syndrome. He has ruled out for MI, and has had prolonged chest pain with negative enzymes which makes this a little less likely. He does however have known CAD and cardiomyopathy. He has had recent admissions for diastolic heart failure. He does not seem to be over loaded from a volume standpoint, but does have an elevated BNP level.  Potential etiologies for his chest discomfort are: Acute coronary syndrome with unstable angina (macrovascular), microvascular ischemia from increased LV wall stress (related to elevated blood pressures and volume level) versus noncardiac etiology.  He has had a cardiac catheterization within the last 3 months which did not show any significant CAD. Given these results, I don't think that proceeding with invasive evaluation as the best option. I discussed the potential course of action with him in detail and we both agree that the best coursewill be to proceed with a noninvasive nuclear stress test today. The fact that he has had a recent catheterization would obviate the concern for possible "balanced ischemia ".  We'll also give him 1 dose of IV Lasix since he did have some orthopnea and potentially angina decubitus symptoms last night. This would go along with his elevated BNP level.   RECOMMENDATION:  Admit under observation status and plan first stress test this morning/afternoon. We will try to do a treadmill Myoview but okay to switch to [place] if necessary.  Increase Imdur back to 30 mg daily. Consider the addition of Ranexa for potential microvascular disease depending the results of his stress test.  IV Lasix 1 and then reassess symptomatology.  No need to recheck echocardiogram as he has just had one done 2 months ago and is not having signs of florid heart failure. Chest x-ray affect is encouraging as is his pulmonary exam.  His blood pressure has been suboptimally controlled while here with me. He is  probably due for some of his blood pressure medications. We will just simply continue his home regimen of carvedilol. Was previously on an ACE inhibitor but that it been discontinued.  Continue aspirin plus Plavix.  Is statin intolerant -- continue Fibrate   Time Spent Directly with Patient: 45 minutes  HARDING, Leonie Green, M.D., M.S. Interventional Cardiologist   Pager # [phone]

## 2015-05-11 NOTE — Progress Notes (Addendum)
Treadmill Nuc result noted below:  IMPRESSION: 1. 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 No reversible defects to suggest ischemia.  2. Global hypokinesis.  3. Left ventricular ejection fraction 34%  4. High-risk stress test findings*.  Discussed with Dr. Ellyn Hack, no ischemic noted. Last echo in 01/2015 showed EF 55%. Nuc EF maybe falsely low, will monitor overnight, obtain limited Echo to see if EF is indeed down. If EF normal, likely discharge tomorrow. If EF is down, will proceed with cath. Result and plan discussed with the patient who is agreeable.  Hilbert Corrigan PA Pager: [phone]

## 2015-05-12 ENCOUNTER — Ambulatory Visit (HOSPITAL_COMMUNITY): Payer: Commercial Managed Care - HMO

## 2015-05-12 DIAGNOSIS — R072 Precordial pain: Secondary | ICD-10-CM | POA: Diagnosis not present

## 2015-05-12 DIAGNOSIS — I2510 Atherosclerotic heart disease of native coronary artery without angina pectoris: Secondary | ICD-10-CM | POA: Diagnosis not present

## 2015-05-12 DIAGNOSIS — R079 Chest pain, unspecified: Secondary | ICD-10-CM | POA: Diagnosis not present

## 2015-05-12 DIAGNOSIS — Z9861 Coronary angioplasty status: Secondary | ICD-10-CM | POA: Diagnosis not present

## 2015-05-12 DIAGNOSIS — E785 Hyperlipidemia, unspecified: Secondary | ICD-10-CM | POA: Diagnosis not present

## 2015-05-12 DIAGNOSIS — R0789 Other chest pain: Secondary | ICD-10-CM | POA: Diagnosis not present

## 2015-05-12 DIAGNOSIS — K2970 Gastritis, unspecified, without bleeding: Secondary | ICD-10-CM | POA: Diagnosis not present

## 2015-05-12 DIAGNOSIS — K2990 Gastroduodenitis, unspecified, without bleeding: Secondary | ICD-10-CM | POA: Diagnosis not present

## 2015-05-12 DIAGNOSIS — I10 Essential (primary) hypertension: Secondary | ICD-10-CM | POA: Diagnosis not present

## 2015-05-12 LAB — BASIC METABOLIC PANEL
ANION GAP: 7 (ref 5–15)
BUN: 22 mg/dL — ABNORMAL HIGH (ref 6–20)
CHLORIDE: 104 mmol/L (ref 101–111)
CO2: 26 mmol/L (ref 22–32)
CREATININE: 1.18 mg/dL (ref 0.61–1.24)
Calcium: 8.8 mg/dL — ABNORMAL LOW (ref 8.9–10.3)
GFR calc Af Amer: >60 mL/min (ref >60)
GLUCOSE: 100 mg/dL — AB (ref 65–99)
Potassium: 3.6 mmol/L (ref 3.5–5.1)
Sodium: 137 mmol/L (ref 135–145)

## 2015-05-12 MED ORDER — PANTOPRAZOLE SODIUM 40 MG PO TBEC
40.0000 mg | DELAYED_RELEASE_TABLET | Freq: Every day | ORAL | Status: DC
  Administered 2015-05-12 – 2015-05-16 (×5): 40 mg via ORAL
  Filled 2015-05-12 (×3): qty 1

## 2015-05-12 MED ORDER — GI COCKTAIL ~~LOC~~
30.0000 mL | Freq: Once | ORAL | Status: AC
  Administered 2015-05-12: 30 mL via ORAL
  Filled 2015-05-12: qty 30

## 2015-05-12 MED ORDER — LORAZEPAM 1 MG PO TABS
1.0000 mg | ORAL_TABLET | Freq: Once | ORAL | Status: AC
  Administered 2015-05-12: 1 mg via ORAL
  Filled 2015-05-12: qty 1

## 2015-05-12 NOTE — Progress Notes (Signed)
Patient Profile: 69 y.o. male with a past medical history significant for long-standing CAD dating back to 1999. He had PTCA done at that time. Since then he had PCI to the LAD and RCA in February and March 2014. Most recent cardiac catheterization was in March of this year due to concern for possible ischemic mitral regurgitation that showed mild to moderate CAD with patent stents. Also with HTN and HLD. Admitted 05/11/15 with chest pain.  Subjective: Still with frequent substernal chest discomfort and belching.   Objective: Vital signs in last 24 hours: Temp:  [97.7 F (36.5 C)-98.4 F (36.9 C)] 97.7 F (36.5 C) (07/13 0639) Pulse Rate:  [58-136] 66 (07/13 0639) Resp:  [10-18] 18 (07/13 0639) BP: (113-199)/(65-99) 140/73 mmHg (07/13 0639) SpO2:  [97 %-100 %] 99 % (07/13 0639) Weight:  [200 lb (90.719 kg)] 200 lb (90.719 kg) (07/13 0639)    Intake/Output from previous day: 07/12 0701 - 07/13 0700 In: 600 [P.O.:600] Out: 1590 [Urine:1590] Intake/Output this shift:    Medications Current Facility-Administered Medications  Medication Dose Route Frequency Provider Last Rate Last Dose  . 0.9 %  sodium chloride infusion  250 mL Intravenous PRN Leonie Man, MD      . acetaminophen (TYLENOL) tablet 650 mg  650 mg Oral Q4H PRN Leonie Man, MD      . allopurinol (ZYLOPRIM) tablet 300 mg  300 mg Oral Daily Leonie Man, MD   300 mg at 05/11/15 1451  . aspirin EC tablet 81 mg  81 mg Oral Daily Leonie Man, MD   81 mg at 05/11/15 1451  . carvedilol (COREG) tablet 3.125 mg  3.125 mg Oral BID Leonie Man, MD   3.125 mg at 05/11/15 2149  . clopidogrel (PLAVIX) tablet 75 mg  75 mg Oral Daily Leonie Man, MD   75 mg at 05/11/15 1454  . colchicine tablet 0.6 mg  0.6 mg Oral Daily PRN Leonie Man, MD      . colesevelam [facility]) tablet 625 mg  625 mg Oral BID WC Leonie Man, MD   625 mg at 05/11/15 1745  . famotidine (PEPCID) tablet 20 mg  20 mg Oral BID Leonie Man, MD   20 mg at 05/11/15 2149  . fluticasone (FLONASE) 50 MCG/ACT nasal spray 2 spray  2 spray Each Nare Daily PRN Leonie Man, MD      . furosemide (LASIX) tablet 40 mg  40 mg Oral Daily Leonie Man, MD      . isosorbide mononitrate (IMDUR) 24 hr tablet 30 mg  30 mg Oral Daily Leonie Man, MD   30 mg at 05/11/15 1453  . lisinopril (PRINIVIL,ZESTRIL) tablet 20 mg  20 mg Oral q morning - 10a Leonie Man, MD   20 mg at 05/11/15 1454  . nitroGLYCERIN (NITROSTAT) SL tablet 0.4 mg  0.4 mg Sublingual Q5 Min x 3 PRN Leonie Man, MD   0.4 mg at 05/12/15 0631  . ondansetron (ZOFRAN) injection 4 mg  4 mg Intravenous Q6H PRN Leonie Man, MD      . potassium chloride SA (K-DUR,KLOR-CON) CR tablet 20 mEq  20 mEq Oral Daily Leonie Man, MD   20 mEq at 05/11/15 1455  . sodium chloride 0.9 % injection 3 mL  3 mL Intravenous Q12H Leonie Man, MD   3 mL at 05/11/15 2151  . sodium chloride 0.9 % injection 3 mL  3 mL Intravenous PRN Leonie Man, MD   3 mL at 05/11/15 1612    PE: General appearance: alert, cooperative and no distress Neck: no carotid bruit and no JVD Lungs: clear to auscultation bilaterally Heart: regular rate and rhythm, S1, S2 normal, no murmur, click, rub or gallop Extremities: no LEE Pulses: 2+ and symmetric Skin: warm and dry Neurologic: Grossly normal  Lab Results:   Recent Labs  05/11/15 0227  WBC 6.6  HGB 12.8*  HCT 37.1*  PLT 209   BMET  Recent Labs  05/11/15 0227 05/12/15 0335  NA 135 137  K 4.0 3.6  CL 103 104  CO2 24 26  GLUCOSE 112* 100*  BUN 22* 22*  CREATININE 1.09 1.18  CALCIUM 9.9 8.8*   Cardiac Panel (last 3 results)  Recent Labs  05/11/15 0523 05/11/15 1233 05/11/15 1900  TROPONINI <0.03 <0.03 0.03    Studies/Results: NST 05/11/15  FINDINGS: Perfusion: Large fixed defect involving the inferior wall consistent with infarctions/scar. No reversible defects to suggest ischemia.  Wall Motion: Inferior  wall motion abnormality due to scar. There is also global hypokinesis  Left Ventricular Ejection Fraction: 34 %  End diastolic volume 024 ml  End systolic volume 097 ml  IMPRESSION: 1. 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 No reversible defects to suggest ischemia.  2. Global hypokinesis.  3. Left ventricular ejection fraction 34%  4. High-risk stress test findings*.  Assessment/Plan  Principal Problem:   Chest pain with moderate risk of acute coronary syndrome Active Problems:   Essential hypertension: Labile   Hyperlipidemia   CAD S/P percutaneous coronary angioplasty   Chronic combined systolic and diastolic CHF (congestive heart failure)   Acute on chronic diastolic congestive heart failure    1. Chest Pain: Cardiac enzymes negative x 3. NST yesterday showed no ischemia. LVEF is down but this is consistent with prior echocardiograms over the years with known scar. Patient also with frequent belching. ? GI etiology. Will give trial of GI cocktail. Consider Protonix.   2. CAD: s/p PCI to LAD and RCA in 2014. Patent stents on most recent cath 12/2014. Plan for repeat cath if 2D echo demonstrates significant reduction in LVF. Continue, ASA, Plavix, BB and nitrate.   3. HTN: BP stable.   4. HLD: continue Welchol.    Brittainy M. Ladoris Gene 05/12/2015 7:04 AM  I have personally seen and examined this patient with Lyda Jester, PA-C. I agree with the assessment and plan as outlined above. His chest pain is atypical and does not seem cardiac related. Recent cardiac cath with stable CAD. Troponin negative. EKG unchanged. Stress test without ischemia. His LVEF is around 40-45% on all other echos done except for most recent which likely overestimated LVEF. Will cancel echo today. Will give GI cocktail. Will start Protonix. The patient does not wish to go home until he is given an answer for his chest pain as he has had multiple presentations  this spring with the same symptoms. GI consult for possible GERD/gastritis.   MCALHANY,CHRISTOPHER 05/12/2015 8:43 AM

## 2015-05-12 NOTE — Consult Note (Signed)
Referring Provider:  Dr. Julianne Handler Primary Care Physician:  Velna Hatchet, MD Primary Gastroenterologist:  Dr. Fuller Plan  Reason for Consultation:  Chest pain  HPI: Kenneth [name] is a 69 y.o. male with PMH significant for long-standing CAD s/p PCA on Plavix, HTN, HLD, chronic combined systolic and diastolic CHF.  He was doing well in his usual state of health until Monday afternoon. He started noticing a discomfort in the right side of his chest that he says is similar to his previous heart symptoms. He described it as a tight squeezing sensation that lasted most of the evening intermittently. He did notice some gas and belching sensation as well, which temporarily relieved the discomfort but it returned very quickly.  His symptoms progressed to the point where he became concerned as there was no relief with sublingual nitroglycerin and he lives alone. He then came to the emergency room early yesterday morning around 2 AM. He has had 2 sets of cardiac markers which show no evidence of MI. His EKG is relatively benign.  He was admitted for observation by the cardiology service.  Stress test ok.  He was given a GI cocktail and placed on some pantoprazole 40 mg daily and pepcid.  GI was called for consult for possible GI source of pain.  He did not notice any improvement in his symptoms after the GI cocktail and still has not noticed any difference even after pepcid and protonix.  The pain is constant on the right side of his chest.  Denies any abdominal pain.  Says that sometimes he has abdominal bloating but has been taking probiotics, which seem to help that.  He denies any history of reflux or esophageal issues with eating, etc.  Patient is very anxious about the pain since he lives alone and it feels similar to previous heart issues.  He is persistent about finding out what is causing the pain.  Colonoscopy with Dr. Fuller Plan 11/2013 with 3 polyps removed (5-8 mm in size; two TA's and one hyperplastic),  mild diverticulosis, and internal hemorrhoids; repeat colonoscopy recommended in 5 years.   Past Medical History  Diagnosis Date  . Hypertension   . Hyperlipidemia   . Tobacco abuse   . CAD S/P percutaneous coronary angioplasty 1999; 2014    a. 1999: s/p PTCA;  b. 11/2012 Cath/PCI: LM mild plaque, LAD 60-34m, LCX 30p, 41m, OM1 59m, RCA 30-40ost, 40-78m, 80-43m/d (3.0x38, 3.0x32, 3.0x24 Promus Premier DESs), 70-80d, EF 40% inf HK;  c. 12/2012 Staged PCI of LAD w/ 3.0x12 Promus Premier DES. d. Cath 3/'16: patent stents, mild LAD & OM3 disease, normal RHC Pressures, EF 35-40%  . Chronic diastolic heart failure, NYHA class 2 09/18/2013  . Squamous cell carcinoma     Back  . Myocardial infarction     '99- MI,"angioplasty", Stents 2& 3'14  . H/O seasonal allergies   . Elevated uric acid in blood   . CHF (congestive heart failure)   . Anginal pain   . Type II diabetes mellitus     "A1C fine since 2012" (05/11/2015)  . Osteoarthritis     "-knees, hands, wrist, fingers" (05/11/2015)  . History of gout   . Depression 1990's    "stopped RX after having suicidal thoughts" (05/11/2015)  . PTSD (post-traumatic stress disorder)     "[place]"    Past Surgical History  Procedure Laterality Date  . Carpal tunnel release Left 2000's  . Ankle reconstruction Right 1960    "had to break then  rebuild"  . Total knee arthroplasty Right 06/08/2014    Procedure: RIGHT TOTAL KNEE ARTHROPLASTY;  Surgeon: Gearlean Alf, MD;  Location: [facility] ORS;  Service: Orthopedics;  Laterality: Right;  . Left heart catheterization with coronary angiogram N/A 12/27/2012    Procedure: LEFT HEART CATHETERIZATION WITH CORONARY ANGIOGRAM;  Surgeon: Jolaine Artist, MD;  Location: [facility] CATH LAB;  Service: Cardiovascular;  LM mild plaque, LAD 60-3m, LCX 30p, 72m, OM1 41m, RCA 30-40ost, 40-6m, 80-28m/d , 70-80d, EF 40%  . Percutaneous coronary stent intervention (pci-s) N/A 12/30/2012    Procedure: PERCUTANEOUS CORONARY STENT  INTERVENTION (PCI-S) - RCA;  Surgeon: Peter M Martinique, MD;  Location: [facility] CATH LAB;  Service: Cardiovascular; 3.0x38, 3.0x32, 3.0x24 Promus Premier DESs   . Percutaneous coronary stent intervention (pci-s) N/A 01/14/2013    PERCUTANEOUS CORONARY STENT INTERVENTION (PCI-S);  Surgeon: Peter M Martinique, MD;  Location: [facility] CATH LAB;  Service: Cardiovascular;  Staged PCI of LAD w/ 3.0x12 Promus Premier DES.  Marland Kitchen Left and right heart catheterization with coronary angiogram N/A 01/12/2015    LEFT AND RIGHT HEART CATHETERIZATION WITH CORONARY ANGIOGRAM;  Surgeon: Peter M Martinique, MD;  Location: [facility] CATH LAB;  Service: Cardiovascular;  Normal Right Heart Pressures. CO/CI (Fick) 5.27 L/min, 2.6 L/min/m2; LAD: mod Ca2+, pLAD 30%, mLAD Stentwidely patent\, m-dLAD ~40%;Cx:2 OMB, terminates as large OM3.  30% small OM1 & m-d Cx;Large, dominant. Prox, mid & distal stents patent ; EF ~35-40%  . Transthoracic echocardiogram  3/15/'216; 4/14/'16    a. 3/'16: EF 45%, basal to mid inferior akinesis and inferolateral to anterolateral hypokinesis. Grade 2 DD. 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 4/'16: EF 55% with basal inferior and mild posterior HK, Mild LVH. Mild-mod MR. Mild LA and RA dilation   . Inguinal hernia repair Right 2004 ?    w/mesh  . Joint replacement    . Coronary angioplasty  1999  . Coronary angioplasty with stent placement    . Squamous cell carcinoma excision  ~ 1997    "back"    Prior to Admission medications   Medication Sig Start Date End Date Taking? Authorizing Provider  allopurinol (ZYLOPRIM) 300 MG tablet Take 300 mg by mouth daily.   Yes Historical Provider, MD  aspirin EC 81 MG tablet Take 81 mg by mouth daily.   Yes Historical Provider, MD  carvedilol (COREG) 6.25 MG tablet Take 0.5 tablets (3.125 mg total) by mouth 2 (two) times daily. 02/23/15  Yes Burtis Junes, NP  cetirizine (ZYRTEC) 10 MG tablet Take 10 mg by mouth daily as needed for allergies.    Yes  Historical Provider, MD  clopidogrel (PLAVIX) 75 MG tablet Take 75 mg by mouth daily.  07/04/14  Yes Historical Provider, MD  colchicine 0.6 MG tablet Take 0.6 mg by mouth daily as needed (Gout).   Yes Historical Provider, MD  colesevelam (WELCHOL) 625 MG tablet Take 1 tablet (625 mg total) by mouth 2 (two) times daily with a meal. 01/10/15  Yes Brett Canales, PA-C  fluticasone (FLONASE) 50 MCG/ACT nasal spray Place 2 sprays into the nose daily as needed for allergies.  12/31/12  Yes Ripudeep Krystal Eaton, MD  furosemide (LASIX) 40 MG tablet Take 1 tablet (40 mg total) by mouth daily. 01/12/15  Yes Peter M Martinique, MD  isosorbide mononitrate (IMDUR) 30 MG 24 hr tablet Take 0.5 tablets (15 mg total) by mouth daily. 03/11/15  Yes Peter M Martinique, MD  KLOR-CON M20 20 MEQ tablet Take  20 mEq by mouth daily.  02/12/15  Yes Historical Provider, MD  lisinopril (PRINIVIL,ZESTRIL) 40 MG tablet Take 20 mg by mouth every morning.   Yes Historical Provider, MD  Multiple Vitamin (MULTIVITAMIN) tablet Take 1 tablet by mouth daily.   Yes Historical Provider, MD  nitroGLYCERIN (NITROSTAT) 0.4 MG SL tablet Place 1 tablet (0.4 mg total) under the tongue every 5 (five) minutes x 3 doses as needed for chest pain. 01/10/15  Yes Erlene Quan, PA-C  Probiotic Product (PROBIOTIC DAILY PO) Take 1 capsule by mouth daily.   Yes Historical Provider, MD  tetrahydrozoline 0.05 % ophthalmic solution Place 1 drop into both eyes 2 (two) times daily as needed (Itchy eyes).   Yes Historical Provider, MD  Alirocumab (PRALUENT) 75 MG/ML SOPN Inject 75 mg into the skin every 14 (fourteen) days. Patient not taking: Reported on 05/11/2015 05/06/15   Peter M Martinique, MD    Current Facility-Administered Medications  Medication Dose Route Frequency Provider Last Rate Last Dose  . 0.9 %  sodium chloride infusion  250 mL Intravenous PRN Leonie Man, MD      . acetaminophen (TYLENOL) tablet 650 mg  650 mg Oral Q4H PRN Leonie Man, MD      . allopurinol  (ZYLOPRIM) tablet 300 mg  300 mg Oral Daily Leonie Man, MD   300 mg at 05/11/15 1451  . aspirin EC tablet 81 mg  81 mg Oral Daily Leonie Man, MD   81 mg at 05/11/15 1451  . carvedilol (COREG) tablet 3.125 mg  3.125 mg Oral BID Leonie Man, MD   3.125 mg at 05/11/15 2149  . clopidogrel (PLAVIX) tablet 75 mg  75 mg Oral Daily Leonie Man, MD   75 mg at 05/11/15 1454  . colchicine tablet 0.6 mg  0.6 mg Oral Daily PRN Leonie Man, MD      . colesevelam [facility]) tablet 625 mg  625 mg Oral BID WC Leonie Man, MD   625 mg at 05/12/15 0900  . famotidine (PEPCID) tablet 20 mg  20 mg Oral BID Leonie Man, MD   20 mg at 05/11/15 2149  . fluticasone (FLONASE) 50 MCG/ACT nasal spray 2 spray  2 spray Each Nare Daily PRN Leonie Man, MD      . furosemide (LASIX) tablet 40 mg  40 mg Oral Daily Leonie Man, MD      . gi cocktail (Maalox,Lidocaine,Donnatal)  30 mL Oral Once Brittainy Erie Noe, PA-C      . isosorbide mononitrate (IMDUR) 24 hr tablet 30 mg  30 mg Oral Daily Leonie Man, MD   30 mg at 05/11/15 1453  . lisinopril (PRINIVIL,ZESTRIL) tablet 20 mg  20 mg Oral q morning - 10a Leonie Man, MD   20 mg at 05/11/15 1454  . nitroGLYCERIN (NITROSTAT) SL tablet 0.4 mg  0.4 mg Sublingual Q5 Min x 3 PRN Leonie Man, MD   0.4 mg at 05/12/15 0631  . ondansetron (ZOFRAN) injection 4 mg  4 mg Intravenous Q6H PRN Leonie Man, MD      . pantoprazole (PROTONIX) EC tablet 40 mg  40 mg Oral Daily Burnell Blanks, MD      . potassium chloride SA (K-DUR,KLOR-CON) CR tablet 20 mEq  20 mEq Oral Daily Leonie Man, MD   20 mEq at 05/11/15 1455  . sodium chloride 0.9 % injection 3 mL  3 mL Intravenous Q12H Shanon Brow  Loren Racer, MD   3 mL at 05/11/15 2151  . sodium chloride 0.9 % injection 3 mL  3 mL Intravenous PRN Leonie Man, MD   3 mL at 05/11/15 1612    Allergies as of 05/11/2015 - Review Complete 05/11/2015  Allergen Reaction Noted  . Crestor [rosuvastatin]   02/12/2014  . Lipitor [atorvastatin]  02/12/2014  . Pravastatin sodium  02/12/2014  . Zocor [simvastatin-high dose]  02/12/2014    Family History  Problem Relation Age of Onset  . Arrhythmia Sister     s/p PPM  . Alzheimer's disease Mother   . Hypertension Mother   . Diabetes Mother   . CVA Father     History   Social History  . Marital Status: Divorced    Spouse Name: N/A  . Number of Children: 3  . Years of Education: N/A   Occupational History  . Custodian    Social History Main Topics  . Smoking status: Light Tobacco Smoker -- 15 years    Types: Cigars  . Smokeless tobacco: Never Used     Comment: rare ciagar on golf days-variable days 1-2 per month  . Alcohol Use: 3.6 oz/week    6 Cans of beer per week  . Drug Use: No  . Sexual Activity: Not on file     Comment: 05/11/2015 "None of your business"   Other Topics Concern  . Not on file   Social History Narrative   Veteran. Receives care through the [facility].     Review of Systems: Ten point ROS is O/W negative except as mentioned in HPI.  Physical Exam: Vital signs in last 24 hours: Temp:  [97.7 F (36.5 C)-98.4 F (36.9 C)] 97.7 F (36.5 C) (07/13 0639) Pulse Rate:  [60-136] 66 (07/13 0639) Resp:  [12-18] 18 (07/13 0639) BP: (113-199)/(65-99) 140/73 mmHg (07/13 0639) SpO2:  [98 %-100 %] 99 % (07/13 0639) Weight:  [200 lb (90.719 kg)] 200 lb (90.719 kg) (07/13 3154)   General:  Alert, Well-developed, well-nourished, pleasant and cooperative in NAD Head:  Normocephalic and atraumatic. Eyes:  Sclera clear, no icterus.  Conjunctiva pink. Ears:  Normal auditory acuity. Mouth:  No deformity or lesions.   Lungs:  Clear throughout to auscultation.  No wheezes, crackles, or rhonchi.  Heart:  Regular rate and rhythm; no murmurs, clicks, rubs,  or gallops. Abdomen:  Soft, non-distended.  BS present.  Minimal epigastric TTP.  Rectal:  Deferred  Msk:  Symmetrical without gross deformities. Pulses:   Normal pulses noted. Extremities:  Without clubbing or edema. Neurologic:  Alert and  oriented x4;  grossly normal neurologically. Skin:  Intact without significant lesions or rashes. Psych:  Alert and cooperative. Normal mood and affect.  Intake/Output from previous day: 07/12 0701 - 07/13 0700 In: 600 [P.O.:600] Out: 1590 [Urine:1590]  Lab Results:  Recent Labs  05/11/15 0227  WBC 6.6  HGB 12.8*  HCT 37.1*  PLT 209   BMET  Recent Labs  05/11/15 0227 05/12/15 0335  NA 135 137  K 4.0 3.6  CL 103 104  CO2 24 26  GLUCOSE 112* 100*  BUN 22* 22*  CREATININE 1.09 1.18  CALCIUM 9.9 8.8*   Studies/Results: Dg Chest 2 View  05/11/2015   CLINICAL DATA:  Right-sided chest pain and shortness of breath tonight.  EXAM: CHEST  2 VIEW  COMPARISON:  02/10/2015  FINDINGS: Borderline heart size and pulmonary vascular congestion demonstrating improvement since previous study. No focal airspace disease or edema.  No consolidation. No blunting of costophrenic angles. No pneumothorax. Mild hyperinflation. Mediastinal contours appear intact. Degenerative changes in the shoulders.  IMPRESSION: Improving congestive changes without significant edema or consolidation.   Electronically Signed   By: Lucienne Capers M.D.   On: 05/11/2015 03:46   Nm Myocar Multi W/spect W/wall Motion / Ef  05/11/2015   CLINICAL DATA:  Chest pain and shortness of Breath. History of prior coronary artery stents.  EXAM: MYOCARDIAL IMAGING WITH SPECT (REST AND EXERCISE)  GATED LEFT VENTRICULAR WALL MOTION STUDY  LEFT VENTRICULAR EJECTION FRACTION  TECHNIQUE: Standard myocardial SPECT imaging was performed after resting intravenous injection of 10 mCi Tc-28m sestamibi. Subsequently, exercise tolerance test was performed by the patient under the supervision of the Cardiology staff. At peak-stress, 30 mCi Tc-79m sestamibi was injected intravenously and standard myocardial SPECT imaging was performed. Quantitative gated imaging  was also performed to evaluate left ventricular wall motion, and estimate left ventricular ejection fraction.  COMPARISON:  None.  FINDINGS: Perfusion: Large fixed defect involving the inferior wall consistent with infarctions/scar. No reversible defects to suggest ischemia.  Wall Motion: Inferior wall motion abnormality due to scar. There is also global hypokinesis  Left Ventricular Ejection Fraction: 34 %  End diastolic volume 027 ml  End systolic volume 253 ml  IMPRESSION: 1. 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 No reversible defects to suggest ischemia.  2. Global hypokinesis.  3. Left ventricular ejection fraction 34%  4. High-risk stress test findings*.  *2012 Appropriate Use Criteria for Coronary Revascularization Focused Update: J Am Coll Cardiol. 6644;03(4):742-595. http://content.airportbarriers.com.aspx?articleid=1201161   Electronically Signed   By: Marijo Sanes M.D.   On: 05/11/2015 13:44   IMPRESSION:  -Atypical chest pain:  Cardiology does not think that this is heart related but patient extremely anxious about the cause and says that it feels like when he had previous heart issues; says that he wants an answer as to what is causing the pain.  ? GI source such as reflux but patient has no history of these issues.  ? Anxiety related.  So far no improvement with GI cocktail, pepcid, protonix. -CAD s/p PCA, on Plavix  PLAN: -Will give a one time dose of Ativan to help him relax. -Will discuss with Dr. Hilarie Fredrickson.  Either we can continue him on anti-reflux medication with outpatient follow-up or could possible consider a "look and see EGD" but no intervention could be performed since he is on Plavix.  *Addendum:  Will plan for EGD tomorrow AM, 7/14.   Macy Polio D.  05/12/2015, 9:19 AM  Pager number [phone]

## 2015-05-13 ENCOUNTER — Observation Stay (HOSPITAL_COMMUNITY): Payer: Commercial Managed Care - HMO

## 2015-05-13 ENCOUNTER — Encounter (HOSPITAL_COMMUNITY): Payer: Self-pay | Admitting: *Deleted

## 2015-05-13 ENCOUNTER — Encounter (HOSPITAL_COMMUNITY)
Admission: EM | Disposition: A | Discharge status: Home / Self Care | Payer: Self-pay | Source: Home / Self Care | Attending: Emergency Medicine

## 2015-05-13 DIAGNOSIS — K2970 Gastritis, unspecified, without bleeding: Secondary | ICD-10-CM | POA: Diagnosis not present

## 2015-05-13 DIAGNOSIS — R0789 Other chest pain: Secondary | ICD-10-CM

## 2015-05-13 DIAGNOSIS — R079 Chest pain, unspecified: Secondary | ICD-10-CM | POA: Diagnosis not present

## 2015-05-13 DIAGNOSIS — E785 Hyperlipidemia, unspecified: Secondary | ICD-10-CM | POA: Diagnosis not present

## 2015-05-13 DIAGNOSIS — K2990 Gastroduodenitis, unspecified, without bleeding: Secondary | ICD-10-CM | POA: Diagnosis not present

## 2015-05-13 DIAGNOSIS — I10 Essential (primary) hypertension: Secondary | ICD-10-CM | POA: Diagnosis not present

## 2015-05-13 DIAGNOSIS — I2510 Atherosclerotic heart disease of native coronary artery without angina pectoris: Secondary | ICD-10-CM | POA: Diagnosis not present

## 2015-05-13 HISTORY — PX: ESOPHAGOGASTRODUODENOSCOPY: SHX5428

## 2015-05-13 SURGERY — EGD (ESOPHAGOGASTRODUODENOSCOPY)
Anesthesia: Moderate Sedation

## 2015-05-13 MED ORDER — DIPHENHYDRAMINE HCL 50 MG/ML IJ SOLN
INTRAMUSCULAR | Status: DC | PRN
  Administered 2015-05-13: 25 mg via INTRAVENOUS

## 2015-05-13 MED ORDER — MIDAZOLAM HCL 10 MG/2ML IJ SOLN
INTRAMUSCULAR | Status: DC | PRN
  Administered 2015-05-13: 2 mg via INTRAVENOUS
  Administered 2015-05-13 (×2): 1 mg via INTRAVENOUS
  Administered 2015-05-13: 2 mg via INTRAVENOUS
  Administered 2015-05-13: 1 mg via INTRAVENOUS

## 2015-05-13 MED ORDER — SODIUM CHLORIDE 0.9 % IV SOLN
INTRAVENOUS | Status: DC
  Administered 2015-05-13: 09:00:00 via INTRAVENOUS

## 2015-05-13 MED ORDER — FENTANYL CITRATE (PF) 100 MCG/2ML IJ SOLN
INTRAMUSCULAR | Status: DC | PRN
  Administered 2015-05-13 (×4): 25 ug via INTRAVENOUS

## 2015-05-13 MED ORDER — BUTAMBEN-TETRACAINE-BENZOCAINE 2-2-14 % EX AERO
INHALATION_SPRAY | CUTANEOUS | Status: DC | PRN
Start: 2015-05-13 — End: 2015-05-13
  Administered 2015-05-13: 2 via TOPICAL

## 2015-05-13 MED ORDER — LORAZEPAM 0.5 MG PO TABS
0.5000 mg | ORAL_TABLET | Freq: Once | ORAL | Status: AC
  Administered 2015-05-13: 0.5 mg via ORAL
  Filled 2015-05-13: qty 1

## 2015-05-13 MED ORDER — FENTANYL CITRATE (PF) 100 MCG/2ML IJ SOLN
INTRAMUSCULAR | Status: AC
  Filled 2015-05-13: qty 2

## 2015-05-13 MED ORDER — MIDAZOLAM HCL 5 MG/ML IJ SOLN
INTRAMUSCULAR | Status: AC
  Filled 2015-05-13: qty 2

## 2015-05-13 MED ORDER — DIPHENHYDRAMINE HCL 50 MG/ML IJ SOLN
INTRAMUSCULAR | Status: AC
  Filled 2015-05-13: qty 1

## 2015-05-13 NOTE — Op Note (Signed)
[facility] [place], [id number]   ENDOSCOPY PROCEDURE REPORT  PATIENT: Kenneth [name], Kenneth [name]  MR#: [id number] BIRTHDATE: [birth year] , 69  yrs. old GENDER: male ENDOSCOPIST: Jerene Bears, MD REFERRED BY:  Verl Bangs, M.D. PROCEDURE DATE:  05/13/2015 PROCEDURE:  EGD, diagnostic and EGD w/ biopsy ASA CLASS:     Class III INDICATIONS:  chest pain. MEDICATIONS: Benadryl 25 mg IV, Fentanyl 100 mcg IV, and Versed 8 mg IV TOPICAL ANESTHETIC: Cetacaine Spray  DESCRIPTION OF PROCEDURE: After the risks benefits and alternatives of the procedure were thoroughly explained, informed consent was obtained.  The standard Pentax adult upper endoscope was introduced through the mouth and advanced to the second portion of the duodenum , Without limitations.  The instrument was slowly withdrawn as the mucosa was fully examined.   ESOPHAGUS: The mucosa of the esophagus appeared normal.   Z-line regular at 40 cm.  STOMACH: There was mild antral gastropathy noted is out erosion or ulceration.  Cold forcep biopsies were taken at the antrum and angularis.   The stomach otherwise appeared normal.  DUODENUM: Mild duodenal inflammation was found in the duodenal bulb. The duodenal mucosa showed no abnormalities in the 2nd part of the duodenum.  Retroflexed views revealed no abnormalities.     The scope was then withdrawn from the patient and the procedure completed.  COMPLICATIONS: There were no immediate complications.  ENDOSCOPIC IMPRESSION: 1.   The mucosa of the esophagus appeared normal, no source of chest pain identified on this upper endoscopy 2.   There was mild antral gastropathy noted; biopsies to exclude H. pylori 3.   The stomach otherwise appeared normal 4.   Mild duodenal bulb inflammation 5.   The duodenal mucosa showed no abnormalities in the 2nd part of the duodenum  RECOMMENDATIONS: 1.  Await biopsy results 2.  No GI source of  chest pain identified on this EGD.  Abdominal ultrasound reviewed showed gallstone without cholecystitis.  Given the lack of cholecystitis, this gallstone is not felt to explain chest pain.  eSigned:  Jerene Bears, MD 05/13/2015 10:57 AM       OI:[id number] Harley Hallmark, MD and The Patient

## 2015-05-13 NOTE — Progress Notes (Signed)
Patient Profile: 69 y.o. male with a past medical history significant for long-standing CAD dating back to 1999. He had PTCA done at that time. Since then he had PCI to the LAD and RCA in February and March 2014. Most recent cardiac catheterization was in March of this year due to concern for possible ischemic mitral regurgitation that showed mild to moderate CAD with patent stents. Also with HTN and HLD. Admitted 05/11/15 with chest pain.  Subjective: Currently chest pain free. Reports that he did not get relief with GI cocktail. Pain resolved after Imdur.   Objective: Vital signs in last 24 hours: Temp:  [97.6 F (36.4 C)-98.3 F (36.8 C)] 97.7 F (36.5 C) (07/14 0358) Pulse Rate:  [60-64] 60 (07/14 0358) Resp:  [16] 16 (07/14 0358) BP: (116-144)/(64-79) 140/79 mmHg (07/14 0358) SpO2:  [94 %-99 %] 99 % (07/14 0358) Weight:  [200 lb 6.4 oz (90.901 kg)] 200 lb 6.4 oz (90.901 kg) (07/14 0358) Last BM Date: 05/10/15  Intake/Output from previous day: 07/13 0701 - 07/14 0700 In: 860 [P.O.:860] Out: 1675 [Urine:1675] Intake/Output this shift:    Medications Current Facility-Administered Medications  Medication Dose Route Frequency Provider Last Rate Last Dose  . 0.9 %  sodium chloride infusion  250 mL Intravenous PRN Leonie Man, MD      . acetaminophen (TYLENOL) tablet 650 mg  650 mg Oral Q4H PRN Leonie Man, MD      . allopurinol (ZYLOPRIM) tablet 300 mg  300 mg Oral Daily Leonie Man, MD   300 mg at 05/12/15 0944  . aspirin EC tablet 81 mg  81 mg Oral Daily Leonie Man, MD   81 mg at 05/12/15 0944  . carvedilol (COREG) tablet 3.125 mg  3.125 mg Oral BID Leonie Man, MD   3.125 mg at 05/12/15 2249  . clopidogrel (PLAVIX) tablet 75 mg  75 mg Oral Daily Leonie Man, MD   75 mg at 05/12/15 0944  . colchicine tablet 0.6 mg  0.6 mg Oral Daily PRN Leonie Man, MD      . colesevelam [facility]) tablet 625 mg  625 mg Oral BID WC Leonie Man, MD   625 mg at  05/12/15 1707  . famotidine (PEPCID) tablet 20 mg  20 mg Oral BID Leonie Man, MD   20 mg at 05/12/15 2248  . fluticasone (FLONASE) 50 MCG/ACT nasal spray 2 spray  2 spray Each Nare Daily PRN Leonie Man, MD      . furosemide (LASIX) tablet 40 mg  40 mg Oral Daily Leonie Man, MD   40 mg at 05/12/15 0944  . isosorbide mononitrate (IMDUR) 24 hr tablet 30 mg  30 mg Oral Daily Leonie Man, MD   30 mg at 05/12/15 0944  . lisinopril (PRINIVIL,ZESTRIL) tablet 20 mg  20 mg Oral q morning - 10a Leonie Man, MD   20 mg at 05/12/15 0944  . nitroGLYCERIN (NITROSTAT) SL tablet 0.4 mg  0.4 mg Sublingual Q5 Min x 3 PRN Leonie Man, MD   0.4 mg at 05/12/15 0631  . ondansetron (ZOFRAN) injection 4 mg  4 mg Intravenous Q6H PRN Leonie Man, MD      . pantoprazole (PROTONIX) EC tablet 40 mg  40 mg Oral Daily Burnell Blanks, MD   40 mg at 05/12/15 0944  . potassium chloride SA (K-DUR,KLOR-CON) CR tablet 20 mEq  20 mEq Oral Daily Leonie Man,  MD   20 mEq at 05/12/15 0944  . sodium chloride 0.9 % injection 3 mL  3 mL Intravenous Q12H Leonie Man, MD   3 mL at 05/12/15 2250  . sodium chloride 0.9 % injection 3 mL  3 mL Intravenous PRN Leonie Man, MD   3 mL at 05/11/15 1612    PE: General appearance: alert, cooperative and no distress Neck: no carotid bruit and no JVD Lungs: clear to auscultation bilaterally Heart: regular rate and rhythm, S1, S2 normal, no murmur, click, rub or gallop Extremities: no LEE Pulses: 2+ and symmetric Skin: warm and dry Neurologic: Grossly normal  Lab Results:   Recent Labs  05/11/15 0227  WBC 6.6  HGB 12.8*  HCT 37.1*  PLT 209   BMET  Recent Labs  05/11/15 0227 05/12/15 0335  NA 135 137  K 4.0 3.6  CL 103 104  CO2 24 26  GLUCOSE 112* 100*  BUN 22* 22*  CREATININE 1.09 1.18  CALCIUM 9.9 8.8*   Cardiac Panel (last 3 results)  Recent Labs  05/11/15 0523 05/11/15 1233 05/11/15 1900  TROPONINI <0.03 <0.03 0.03      Studies/Results: NST 05/11/15  FINDINGS: Perfusion: Large fixed defect involving the inferior wall consistent with infarctions/scar. No reversible defects to suggest ischemia.  Wall Motion: Inferior wall motion abnormality due to scar. There is also global hypokinesis  Left Ventricular Ejection Fraction: 34 %  End diastolic volume 272 ml  End systolic volume 536 ml  IMPRESSION: 1. 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 No reversible defects to suggest ischemia.  2. Global hypokinesis.  3. Left ventricular ejection fraction 34%  4. High-risk stress test findings*.  Assessment/Plan  Principal Problem:   Chest pain with moderate risk of acute coronary syndrome Active Problems:   Essential hypertension: Labile   Hyperlipidemia   CAD S/P percutaneous coronary angioplasty   Chronic combined systolic and diastolic CHF (congestive heart failure)   Acute on chronic diastolic congestive heart failure   Atypical chest pain   1. Atypical Chest Pain: Cardiac enzymes negative x 3. NST  showed no ischemia. LVEF is down but this is consistent with prior echocardiograms over the years with known scar.  ? GI etiology. EDG scheduled today with Dr. Hilarie Fredrickson, as well as an abdominal ultrasound to exclude GI source of pain . Appreciate GI's assistance.   2. CAD: s/p PCI to LAD and RCA in 2014. Patent stents on most recent cath 12/2014. Continue, ASA, Plavix, BB and nitrate.   3. HTN: BP stable.   4. HLD: continue Welchol.    Brittainy M. Ladoris Gene 05/13/2015 7:40 AM  I have personally seen and examined this patient with Lyda Jester, PA-C. I agree with the assessment and plan as outlined above. He continues to have long periods of chest pain which are very atypical for a cardiac cause. He has had recent coronary angiography with stable coronary anatomy. There is no objective evidence of ischemia. This could represent microvascular angina so will  increase Imdur to 30 mg po BID. Appreciate GI consult. Pt scheduled for EGD today to exclude GI source of pain. Continue PPI. U/S pending.   Kalliope Riesen 05/13/2015 8:58 AM

## 2015-05-14 ENCOUNTER — Encounter (HOSPITAL_COMMUNITY): Payer: Self-pay | Admitting: Internal Medicine

## 2015-05-14 ENCOUNTER — Encounter (HOSPITAL_COMMUNITY)
Admission: EM | Disposition: A | Discharge status: Home / Self Care | Payer: Self-pay | Source: Home / Self Care | Attending: Emergency Medicine

## 2015-05-14 DIAGNOSIS — R0602 Shortness of breath: Secondary | ICD-10-CM | POA: Diagnosis not present

## 2015-05-14 DIAGNOSIS — I2510 Atherosclerotic heart disease of native coronary artery without angina pectoris: Secondary | ICD-10-CM | POA: Diagnosis not present

## 2015-05-14 DIAGNOSIS — I2584 Coronary atherosclerosis due to calcified coronary lesion: Secondary | ICD-10-CM | POA: Diagnosis not present

## 2015-05-14 DIAGNOSIS — R072 Precordial pain: Secondary | ICD-10-CM | POA: Diagnosis not present

## 2015-05-14 HISTORY — PX: CARDIAC CATHETERIZATION: SHX172

## 2015-05-14 LAB — BASIC METABOLIC PANEL
ANION GAP: 5 (ref 5–15)
BUN: 16 mg/dL (ref 6–20)
CO2: 28 mmol/L (ref 22–32)
Calcium: 9.3 mg/dL (ref 8.9–10.3)
Chloride: 104 mmol/L (ref 101–111)
Creatinine, Ser: 0.95 mg/dL (ref 0.61–1.24)
GFR calc non Af Amer: >60 mL/min (ref >60)
Glucose, Bld: 112 mg/dL — ABNORMAL HIGH (ref 65–99)
Potassium: 4 mmol/L (ref 3.5–5.1)
Sodium: 137 mmol/L (ref 135–145)

## 2015-05-14 LAB — POCT ACTIVATED CLOTTING TIME
ACTIVATED CLOTTING TIME: 251 s
Activated Clotting Time: 233 seconds

## 2015-05-14 LAB — CBC
HCT: 39.2 % (ref 39.0–52.0)
HEMOGLOBIN: 13.2 g/dL (ref 13.0–17.0)
MCH: 31.4 pg (ref 26.0–34.0)
MCHC: 33.7 g/dL (ref 30.0–36.0)
MCV: 93.1 fL (ref 78.0–100.0)
Platelets: 187 10*3/uL (ref 150–400)
RBC: 4.21 MIL/uL — ABNORMAL LOW (ref 4.22–5.81)
RDW: 15.9 % — ABNORMAL HIGH (ref 11.5–15.5)
WBC: 5.6 10*3/uL (ref 4.0–10.5)

## 2015-05-14 LAB — PROTIME-INR
INR: 1.26 (ref 0.00–1.49)
Prothrombin Time: 16 seconds — ABNORMAL HIGH (ref 11.6–15.2)

## 2015-05-14 SURGERY — LEFT HEART CATH AND CORONARY ANGIOGRAPHY
Anesthesia: LOCAL

## 2015-05-14 MED ORDER — ASPIRIN 81 MG PO CHEW
81.0000 mg | CHEWABLE_TABLET | ORAL | Status: AC
  Administered 2015-05-14: 81 mg via ORAL
  Filled 2015-05-14: qty 1

## 2015-05-14 MED ORDER — LIDOCAINE HCL (PF) 1 % IJ SOLN
INTRAMUSCULAR | Status: DC | PRN
  Administered 2015-05-14: 13:00:00

## 2015-05-14 MED ORDER — MIDAZOLAM HCL 2 MG/2ML IJ SOLN
INTRAMUSCULAR | Status: AC
  Filled 2015-05-14: qty 2

## 2015-05-14 MED ORDER — MIDAZOLAM HCL 2 MG/2ML IJ SOLN
INTRAMUSCULAR | Status: DC | PRN
  Administered 2015-05-14: 2 mg via INTRAVENOUS

## 2015-05-14 MED ORDER — SODIUM CHLORIDE 0.9 % WEIGHT BASED INFUSION: 1.0000 mL/kg/h | INTRAVENOUS | Status: AC

## 2015-05-14 MED ORDER — ADENOSINE 12 MG/4ML IV SOLN
16.0000 mL | INTRAVENOUS | Status: DC
  Filled 2015-05-14: qty 16

## 2015-05-14 MED ORDER — ISOSORBIDE MONONITRATE ER 30 MG PO TB24
30.0000 mg | ORAL_TABLET | Freq: Two times a day (BID) | ORAL | Status: DC
  Administered 2015-05-14 – 2015-05-16 (×5): 30 mg via ORAL
  Filled 2015-05-14 (×6): qty 1

## 2015-05-14 MED ORDER — LIDOCAINE HCL (PF) 1 % IJ SOLN
INTRAMUSCULAR | Status: AC
  Filled 2015-05-14: qty 30

## 2015-05-14 MED ORDER — SODIUM CHLORIDE 0.9 % IJ SOLN
3.0000 mL | Freq: Two times a day (BID) | INTRAMUSCULAR | Status: DC
  Administered 2015-05-14: 3 mL via INTRAVENOUS

## 2015-05-14 MED ORDER — HEPARIN SODIUM (PORCINE) 1000 UNIT/ML IJ SOLN
INTRAMUSCULAR | Status: DC | PRN
  Administered 2015-05-14: 3000 [IU] via INTRAVENOUS
  Administered 2015-05-14: 5000 [IU] via INTRAVENOUS
  Administered 2015-05-14: 2000 [IU] via INTRAVENOUS

## 2015-05-14 MED ORDER — SODIUM CHLORIDE 0.9 % IV SOLN: 250.0000 mL | INTRAVENOUS | Status: DC | PRN

## 2015-05-14 MED ORDER — LORAZEPAM 1 MG PO TABS
1.0000 mg | ORAL_TABLET | Freq: Four times a day (QID) | ORAL | Status: DC | PRN
  Administered 2015-05-14 – 2015-05-16 (×5): 1 mg via ORAL
  Filled 2015-05-14 (×5): qty 1

## 2015-05-14 MED ORDER — HEPARIN (PORCINE) IN NACL 2-0.9 UNIT/ML-% IJ SOLN
INTRAMUSCULAR | Status: AC
  Filled 2015-05-14: qty 1000

## 2015-05-14 MED ORDER — SODIUM CHLORIDE 0.9 % IJ SOLN
3.0000 mL | Freq: Two times a day (BID) | INTRAMUSCULAR | Status: DC
  Administered 2015-05-14 – 2015-05-16 (×4): 3 mL via INTRAVENOUS

## 2015-05-14 MED ORDER — FENTANYL CITRATE (PF) 100 MCG/2ML IJ SOLN
INTRAMUSCULAR | Status: AC
  Filled 2015-05-14: qty 2

## 2015-05-14 MED ORDER — FENTANYL CITRATE (PF) 100 MCG/2ML IJ SOLN
INTRAMUSCULAR | Status: DC | PRN
  Administered 2015-05-14: 25 ug via INTRAVENOUS

## 2015-05-14 MED ORDER — SODIUM CHLORIDE 0.9 % IJ SOLN: 3.0000 mL | INTRAMUSCULAR | Status: DC | PRN

## 2015-05-14 MED ORDER — SODIUM CHLORIDE 0.9 % IV SOLN
INTRAVENOUS | Status: DC
  Administered 2015-05-14: 10:00:00 via INTRAVENOUS

## 2015-05-14 MED ORDER — VERAPAMIL HCL 2.5 MG/ML IV SOLN
INTRAVENOUS | Status: AC
Start: 2015-05-14 — End: 2015-05-14
  Filled 2015-05-14: qty 2

## 2015-05-14 MED ORDER — IOHEXOL 350 MG/ML SOLN
INTRAVENOUS | Status: DC | PRN
  Administered 2015-05-14: 90 mL via INTRACARDIAC

## 2015-05-14 MED ORDER — VERAPAMIL HCL 2.5 MG/ML IV SOLN
INTRAVENOUS | Status: DC | PRN
  Administered 2015-05-14: 12:00:00 via INTRA_ARTERIAL

## 2015-05-14 MED ORDER — HEPARIN SODIUM (PORCINE) 1000 UNIT/ML IJ SOLN
INTRAMUSCULAR | Status: AC
  Filled 2015-05-14: qty 1

## 2015-05-14 MED ORDER — ADENOSINE (DIAGNOSTIC) 140MCG/KG/MIN
INTRAVENOUS | Status: DC | PRN
  Administered 2015-05-14: 140 ug/kg/min via INTRAVENOUS

## 2015-05-14 MED ORDER — CARVEDILOL 6.25 MG PO TABS
6.2500 mg | ORAL_TABLET | Freq: Two times a day (BID) | ORAL | Status: DC
  Administered 2015-05-15 – 2015-05-16 (×4): 6.25 mg via ORAL
  Filled 2015-05-14 (×5): qty 1

## 2015-05-14 SURGICAL SUPPLY — 20 items
CATH INFINITI 5 FR JL3.5 (CATHETERS) ×2 IMPLANT
CATH INFINITI 5FR ANG PIGTAIL (CATHETERS) ×2 IMPLANT
CATH INFINITI 5FR MULTPACK ANG (CATHETERS) IMPLANT
CATH INFINITI JR4 5F (CATHETERS) ×2 IMPLANT
CATH MICROCATH NAVVUS (MICROCATHETER) ×1 IMPLANT
CATH VISTA GUIDE 6FR XBLAD3.5 (CATHETERS) ×2 IMPLANT
DEVICE RAD COMP TR BAND LRG (VASCULAR PRODUCTS) ×2 IMPLANT
GLIDESHEATH SLEND SS 6F .021 (SHEATH) ×2 IMPLANT
KIT ESSENTIALS PG (KITS) ×2 IMPLANT
KIT HEART LEFT (KITS) ×2 IMPLANT
MICROCATHETER NAVVUS (MICROCATHETER) ×2
PACK CARDIAC CATHETERIZATION (CUSTOM PROCEDURE TRAY) ×2 IMPLANT
SHEATH PINNACLE 5F 10CM (SHEATH) IMPLANT
SYR MEDRAD MARK V 150ML (SYRINGE) ×2 IMPLANT
TRANSDUCER W/STOPCOCK (MISCELLANEOUS) ×2 IMPLANT
TUBING CIL FLEX 10 FLL-RA (TUBING) ×2 IMPLANT
WIRE ASAHI PROWATER 180CM (WIRE) ×2 IMPLANT
WIRE EMERALD 3MM-J .035X150CM (WIRE) IMPLANT
WIRE PRESSURE VERRATA (WIRE) ×2 IMPLANT
WIRE SAFE-T 1.5MM-J .035X260CM (WIRE) ×2 IMPLANT

## 2015-05-14 NOTE — Progress Notes (Signed)
Pt transported off unit to cath lab. P. Amo Raisa Ditto RN  

## 2015-05-14 NOTE — Progress Notes (Signed)
MD paged regarding 18 beat run of V tach. Pt asymptomatic. New orders given. Will continue to closely monitor.  Raliegh Ip RN

## 2015-05-14 NOTE — Interval H&P Note (Signed)
History and Physical Interval Note:  05/14/2015 11:50 AM  [name]  has presented today for surgery, with the diagnosis of cp  The various methods of treatment have been discussed with the patient and family. After consideration of risks, benefits and other options for treatment, the patient has consented to  Procedure(s): Left Heart Cath and Coronary Angiography (N/A) as a surgical intervention .  The patient's history has been reviewed, patient examined, no change in status, stable for surgery.  I have reviewed the patient's chart and labs.  Questions were answered to the patient's satisfaction.    Cath Lab Visit (complete for each Cath Lab visit)  Clinical Evaluation Leading to the Procedure:   ACS: Yes.    Non-ACS:    Anginal Classification: CCS IV  Anti-ischemic medical therapy: Maximal Therapy (2 or more classes of medications)  Non-Invasive Test Results: No non-invasive testing performed  Prior CABG: No previous CABG       [name] [facility] 05/14/2015 11:50 AM

## 2015-05-14 NOTE — Progress Notes (Signed)
Pt back to the room from cath lab; right hand radial TR band at 12cc per report; pt denies any pain; VSS; telemetry reapplied and verified; cath site level 0; no hematoma, bruising or active bleeding noted; will closely monitor pt per protocol and order. Francis Gaines Arvle Grabe RN.

## 2015-05-14 NOTE — Progress Notes (Addendum)
     SUBJECTIVE: Continues to have chest pain. No SOB  BP 139/78 mmHg  Pulse 62  Temp(Src) 97.9 F (36.6 C) (Oral)  Resp 18  Ht 5\' 8"  (1.727 m)  Wt 200 lb 6.4 oz (90.901 kg)  BMI 30.48 kg/m2  SpO2 100%  Intake/Output Summary (Last 24 hours) at 05/14/15 0724 Last data filed at 05/14/15 2951  Gross per 24 hour  Intake    240 ml  Output   2150 ml  Net  -1910 ml    PHYSICAL EXAM General: Well developed, well nourished, in no acute distress. Alert and oriented x 3.  Psych:  Good affect, responds appropriately Neck: No JVD. No masses noted.  Lungs: Clear bilaterally with no wheezes or rhonci noted.  Heart: RRR with no murmurs noted. Abdomen: Bowel sounds are present. Soft, non-tender.  Extremities: No lower extremity edema.   LABS: Basic Metabolic Panel:  Recent Labs  05/12/15 0335  NA 137  K 3.6  CL 104  CO2 26  GLUCOSE 100*  BUN 22*  CREATININE 1.18  CALCIUM 8.8*   Cardiac Enzymes:  Recent Labs  05/11/15 1233 05/11/15 1900  TROPONINI <0.03 0.03    Current Meds: . allopurinol  300 mg Oral Daily  . aspirin EC  81 mg Oral Daily  . carvedilol  3.125 mg Oral BID  . clopidogrel  75 mg Oral Daily  . colesevelam  625 mg Oral BID WC  . furosemide  40 mg Oral Daily  . isosorbide mononitrate  30 mg Oral Daily  . lisinopril  20 mg Oral q morning - 10a  . pantoprazole  40 mg Oral Daily  . potassium chloride SA  20 mEq Oral Daily  . sodium chloride  3 mL Intravenous Q12H     ASSESSMENT AND PLAN:  1. Chest Pain/Unstable angina: Cardiac enzymes negative x 3. NST 05/11/15 showed no ischemia, known scar. LVEF is down but this is consistent with prior echocardiograms over the years with known scar. Pt continues to have episodes of chest pain. GI workup including EGD is unrevealing and there is no symptomatic improvement with PPI therapy. His chest pain could be non-cardiac vs small vessel disease vs progression large epicardial vessel disease. Will plan cardiac cath  today with Dr. Martinique. Clear liquid breakfast then NPO. Check BMET, CBC, INR now.   2. CAD: s/p PCI to LAD and RCA in 2014. Patent stents on most recent cath 12/2014. Continue, ASA, Plavix, BB and nitrate. Plan cath today as above.   3. HTN: BP stable.   4. HLD: continue Welchol.    MCALHANY,CHRISTOPHER  7/15/20167:24 AM

## 2015-05-14 NOTE — Progress Notes (Signed)
TR band removed at 1430, site remains level 0, no hematoma, bruising or active bleeding noted; clean, dry sterile dsg applied to hand. Will continue to monitor pt closely, will follow protocol and orders. Francis Gaines Goerge Mohr RN.

## 2015-05-14 NOTE — H&P (View-Only) (Signed)
     SUBJECTIVE: Continues to have chest pain. No SOB  BP 139/78 mmHg  Pulse 62  Temp(Src) 97.9 F (36.6 C) (Oral)  Resp 18  Ht 5\' 8"  (1.727 m)  Wt 200 lb 6.4 oz (90.901 kg)  BMI 30.48 kg/m2  SpO2 100%  Intake/Output Summary (Last 24 hours) at 05/14/15 0724 Last data filed at 05/14/15 6256  Gross per 24 hour  Intake    240 ml  Output   2150 ml  Net  -1910 ml    PHYSICAL EXAM General: Well developed, well nourished, in no acute distress. Alert and oriented x 3.  Psych:  Good affect, responds appropriately Neck: No JVD. No masses noted.  Lungs: Clear bilaterally with no wheezes or rhonci noted.  Heart: RRR with no murmurs noted. Abdomen: Bowel sounds are present. Soft, non-tender.  Extremities: No lower extremity edema.   LABS: Basic Metabolic Panel:  Recent Labs  05/12/15 0335  NA 137  K 3.6  CL 104  CO2 26  GLUCOSE 100*  BUN 22*  CREATININE 1.18  CALCIUM 8.8*   Cardiac Enzymes:  Recent Labs  05/11/15 1233 05/11/15 1900  TROPONINI <0.03 0.03    Current Meds: . allopurinol  300 mg Oral Daily  . aspirin EC  81 mg Oral Daily  . carvedilol  3.125 mg Oral BID  . clopidogrel  75 mg Oral Daily  . colesevelam  625 mg Oral BID WC  . furosemide  40 mg Oral Daily  . isosorbide mononitrate  30 mg Oral Daily  . lisinopril  20 mg Oral q morning - 10a  . pantoprazole  40 mg Oral Daily  . potassium chloride SA  20 mEq Oral Daily  . sodium chloride  3 mL Intravenous Q12H     ASSESSMENT AND PLAN:  1. Chest Pain/Unstable angina: Cardiac enzymes negative x 3. NST 05/11/15 showed no ischemia, known scar. LVEF is down but this is consistent with prior echocardiograms over the years with known scar. Pt continues to have episodes of chest pain. GI workup including EGD is unrevealing and there is no symptomatic improvement with PPI therapy. His chest pain could be non-cardiac vs small vessel disease vs progression large epicardial vessel disease. Will plan cardiac cath  today with Dr. Martinique. Clear liquid breakfast then NPO. Check BMET, CBC, INR now.   2. CAD: s/p PCI to LAD and RCA in 2014. Patent stents on most recent cath 12/2014. Continue, ASA, Plavix, BB and nitrate. Plan cath today as above.   3. HTN: BP stable.   4. HLD: continue Welchol.    Mishael Haran  7/15/20167:24 AM

## 2015-05-14 NOTE — Progress Notes (Signed)
Pt dsg has small stain of blood noted; site oozing slightly, TR band with 10cc of air applied. Will leave on for 28min and begin to deflate per protocol. Site remains level 0 with not hematoma or bruising noted. Francis Gaines Rochester Serpe RN.

## 2015-05-14 NOTE — Progress Notes (Signed)
Pt TR bad was removed at 1715, clean sterile dsg applied remains clean, dry and intact. Not stain, bruising, active bleeding or hematoma noted. VSS stable. Pt in bed comfortably with call light within reach. Will closely monitor. Francis Gaines Shemuel Harkleroad RN.

## 2015-05-15 DIAGNOSIS — I10 Essential (primary) hypertension: Secondary | ICD-10-CM | POA: Diagnosis not present

## 2015-05-15 DIAGNOSIS — I5033 Acute on chronic diastolic (congestive) heart failure: Secondary | ICD-10-CM | POA: Diagnosis not present

## 2015-05-15 DIAGNOSIS — R079 Chest pain, unspecified: Secondary | ICD-10-CM | POA: Diagnosis not present

## 2015-05-15 DIAGNOSIS — I2510 Atherosclerotic heart disease of native coronary artery without angina pectoris: Secondary | ICD-10-CM | POA: Diagnosis not present

## 2015-05-15 DIAGNOSIS — I5042 Chronic combined systolic (congestive) and diastolic (congestive) heart failure: Secondary | ICD-10-CM | POA: Diagnosis not present

## 2015-05-15 LAB — BASIC METABOLIC PANEL
Anion gap: 7 (ref 5–15)
BUN: 19 mg/dL (ref 6–20)
CO2: 28 mmol/L (ref 22–32)
Calcium: 9.3 mg/dL (ref 8.9–10.3)
Chloride: 105 mmol/L (ref 101–111)
Creatinine, Ser: 1.12 mg/dL (ref 0.61–1.24)
GFR calc Af Amer: >60 mL/min (ref >60)
GLUCOSE: 125 mg/dL — AB (ref 65–99)
POTASSIUM: 3.9 mmol/L (ref 3.5–5.1)
SODIUM: 140 mmol/L (ref 135–145)

## 2015-05-15 NOTE — Progress Notes (Addendum)
SUBJECTIVE:  No further CP  OBJECTIVE:   Vitals:   Filed Vitals:   05/14/15 1500 05/14/15 1700 05/14/15 2215 05/15/15 0507  BP: 120/62 117/64 131/69 106/74  Pulse: 66 68 66 60  Temp:   98 F (36.7 C) 98.1 F (36.7 C)  TempSrc:   Oral Oral  Resp: 18 18 18 18   Height:      Weight:    198 lb 14.4 oz (90.22 kg)  SpO2: 100% 99% 100% 99%   I&O's:   Intake/Output Summary (Last 24 hours) at 05/15/15 0810 Last data filed at 05/15/15 6962  Gross per 24 hour  Intake 785.83 ml  Output   2475 ml  Net -1689.17 ml   TELEMETRY: Reviewed telemetry pt in NSR:     PHYSICAL EXAM General: Well developed, well nourished, in no acute distress Head: Eyes PERRLA, No xanthomas.   Normal cephalic and atramatic  Lungs:   Clear bilaterally to auscultation and percussion. Heart:   HRRR S1 S2 Pulses are 2+ & equal. Abdomen: Bowel sounds are positive, abdomen soft and non-tender without masses  Extremities:   No clubbing, cyanosis or edema.  DP +1 Neuro: Alert and oriented X 3. Psych:  Good affect, responds appropriately   LABS: Basic Metabolic Panel:  Recent Labs  05/14/15 0940  NA 137  K 4.0  CL 104  CO2 28  GLUCOSE 112*  BUN 16  CREATININE 0.95  CALCIUM 9.3   Liver Function Tests: No results for input(s): AST, ALT, ALKPHOS, BILITOT, PROT, ALBUMIN in the last 72 hours. No results for input(s): LIPASE, AMYLASE in the last 72 hours. CBC:  Recent Labs  05/14/15 0940  WBC 5.6  HGB 13.2  HCT 39.2  MCV 93.1  PLT 187   Cardiac Enzymes: No results for input(s): CKTOTAL, CKMB, CKMBINDEX, TROPONINI in the last 72 hours. BNP: Invalid input(s): POCBNP D-Dimer: No results for input(s): DDIMER in the last 72 hours. Hemoglobin A1C: No results for input(s): HGBA1C in the last 72 hours. Fasting Lipid Panel: No results for input(s): CHOL, HDL, LDLCALC, TRIG, CHOLHDL, LDLDIRECT in the last 72 hours. Thyroid Function Tests: No results for input(s): TSH, T4TOTAL, T3FREE, THYROIDAB  in the last 72 hours.  Invalid input(s): FREET3 Anemia Panel: No results for input(s): VITAMINB12, FOLATE, FERRITIN, TIBC, IRON, RETICCTPCT in the last 72 hours. Coag Panel:   Lab Results  Component Value Date   INR 1.26 05/14/2015   INR 1.19 01/09/2015   INR 1.23 01/06/2015    RADIOLOGY: Dg Chest 2 View  05/11/2015   CLINICAL DATA:  Right-sided chest pain and shortness of breath tonight.  EXAM: CHEST  2 VIEW  COMPARISON:  02/10/2015  FINDINGS: Borderline heart size and pulmonary vascular congestion demonstrating improvement since previous study. No focal airspace disease or edema. No consolidation. No blunting of costophrenic angles. No pneumothorax. Mild hyperinflation. Mediastinal contours appear intact. Degenerative changes in the shoulders.  IMPRESSION: Improving congestive changes without significant edema or consolidation.   Electronically Signed   By: Lucienne Capers M.D.   On: 05/11/2015 03:46   US Abdomen Complete  05/13/2015   CLINICAL DATA:  Chest pain 4 days.  EXAM: ULTRASOUND ABDOMEN COMPLETE  COMPARISON:  None.  FINDINGS: Gallbladder: 3.4 cm stone present. No gallbladder wall thickening. Negative sonographic Murphy sign. No adjacent free fluid.  Common bile duct: Diameter: 3.6 mm.  Liver: No focal lesion identified. Within normal limits in parenchymal echogenicity.  IVC: No abnormality visualized.  Pancreas: Visualized portion unremarkable.  Spleen:  Size and appearance within normal limits.  Right Kidney: Length: 11.7 cm. Echogenicity within normal limits. No mass or hydronephrosis visualized.  Left Kidney: Length: 13.1 cm. Echogenicity within normal limits. No mass or hydronephrosis visualized.  Abdominal aorta: No aneurysm visualized.  Other findings: None.  IMPRESSION: No acute hepatobiliary findings.  3.4 cm gallstone without additional sonographic evidence to suggest cholecystitis.   Electronically Signed   By: Marin Olp M.D.   On: 05/13/2015 09:55   Nm Myocar Multi  W/spect W/wall Motion / Ef  05/11/2015   CLINICAL DATA:  Chest pain and shortness of Breath. History of prior coronary artery stents.  EXAM: MYOCARDIAL IMAGING WITH SPECT (REST AND EXERCISE)  GATED LEFT VENTRICULAR WALL MOTION STUDY  LEFT VENTRICULAR EJECTION FRACTION  TECHNIQUE: Standard myocardial SPECT imaging was performed after resting intravenous injection of 10 mCi Tc-93m sestamibi. Subsequently, exercise tolerance test was performed by the patient under the supervision of the Cardiology staff. At peak-stress, 30 mCi Tc-100m sestamibi was injected intravenously and standard myocardial SPECT imaging was performed. Quantitative gated imaging was also performed to evaluate left ventricular wall motion, and estimate left ventricular ejection fraction.  COMPARISON:  None.  FINDINGS: Perfusion: Large fixed defect involving the inferior wall consistent with infarctions/scar. No reversible defects to suggest ischemia.  Wall Motion: Inferior wall motion abnormality due to scar. There is also global hypokinesis  Left Ventricular Ejection Fraction: 34 %  End diastolic volume 016 ml  End systolic volume 010 ml  IMPRESSION: 1. 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 No reversible defects to suggest ischemia.  2. Global hypokinesis.  3. Left ventricular ejection fraction 34%  4. High-risk stress test findings*.  *2012 Appropriate Use Criteria for Coronary Revascularization Focused Update: J Am Coll Cardiol. 9323;55(7):322-025. http://content.[email or web address].aspx?articleid=1201161   Electronically Signed   By: Marijo Sanes M.D.   On: 05/11/2015 13:44   ASSESSMENT AND PLAN:  1. Chest Pain/Unstable angina: Cardiac enzymes negative x 3. NST 05/11/15 showed no ischemia, known scar. LVEF is down but this is consistent with prior echocardiograms over the years with known scar. Pt continues to have episodes of chest pain. GI workup including EGD is unrevealing and there is no symptomatic  improvement with PPI therapy. Cath showed nonobstructive ASCAD with patent stents.  Borderline stenosis in the mid- distal LAD. This lesion was unchanged from prior studies. FFR is 0.82 in an intermediate range. Based on these findings it was felt that lesion did not explain his resting chest pain.  Normal LV EDP.  There is felt to be an anxiety component to his CP and recommended that if pain continues, to consider SSRI.  He did not have any CP last night.    2. CAD: s/p PCI to LAD and RCA in 2014. Patent stents on most cath 12/2014 and 05/14/2015. Continue, ASA, Plavix, BB and nitrate.   3. HTN: BP stable.   4. HLD: continue Welchol.   5.  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  Patient asymptomatic.  EF 34% on nuc.  I will get an echo today to confirm EF since last echo in 01/2015 showed normal LVF.  If EF < 35% will need EP consult.  BB increased.     Kenneth Margarita, MD  05/15/2015  8:10 AM

## 2015-05-16 ENCOUNTER — Encounter (HOSPITAL_COMMUNITY): Payer: Self-pay | Admitting: Physician Assistant

## 2015-05-16 ENCOUNTER — Observation Stay (HOSPITAL_COMMUNITY): Payer: Commercial Managed Care - HMO

## 2015-05-16 DIAGNOSIS — R079 Chest pain, unspecified: Secondary | ICD-10-CM | POA: Diagnosis not present

## 2015-05-16 DIAGNOSIS — I10 Essential (primary) hypertension: Secondary | ICD-10-CM | POA: Diagnosis not present

## 2015-05-16 DIAGNOSIS — I5042 Chronic combined systolic (congestive) and diastolic (congestive) heart failure: Secondary | ICD-10-CM | POA: Diagnosis not present

## 2015-05-16 DIAGNOSIS — I472 Ventricular tachycardia: Secondary | ICD-10-CM | POA: Diagnosis not present

## 2015-05-16 DIAGNOSIS — I340 Nonrheumatic mitral (valve) insufficiency: Secondary | ICD-10-CM

## 2015-05-16 DIAGNOSIS — I2510 Atherosclerotic heart disease of native coronary artery without angina pectoris: Secondary | ICD-10-CM | POA: Diagnosis not present

## 2015-05-16 DIAGNOSIS — I5033 Acute on chronic diastolic (congestive) heart failure: Secondary | ICD-10-CM | POA: Diagnosis not present

## 2015-05-16 MED ORDER — FUROSEMIDE 40 MG PO TABS: 40.0000 mg | ORAL_TABLET | Freq: Every day | ORAL | Status: AC

## 2015-05-16 MED ORDER — ISOSORBIDE MONONITRATE ER 30 MG PO TB24: 30.0000 mg | ORAL_TABLET | Freq: Two times a day (BID) | ORAL | Status: DC

## 2015-05-16 MED ORDER — CARVEDILOL 6.25 MG PO TABS: 6.2500 mg | ORAL_TABLET | Freq: Two times a day (BID) | ORAL | Status: DC

## 2015-05-16 NOTE — Progress Notes (Addendum)
SUBJECTIVE:  No complaints  OBJECTIVE:   Vitals:   Filed Vitals:   05/15/15 1015 05/15/15 1340 05/15/15 2125 05/16/15 0432  BP: 152/88 118/73 122/77 114/76  Pulse: 72 71 63 62  Temp: 98.2 F (36.8 C) 98 F (36.7 C) 97.8 F (36.6 C) 98.2 F (36.8 C)  TempSrc: Oral Oral Oral Oral  Resp: 18 19 12 16   Height:      Weight:    198 lb 13.7 oz (90.2 kg)  SpO2: 100% 100% 100% 97%   I&O's:   Intake/Output Summary (Last 24 hours) at 05/16/15 0813 Last data filed at 05/16/15 0435  Gross per 24 hour  Intake    480 ml  Output   1475 ml  Net   -995 ml   TELEMETRY: Reviewed telemetry pt in NSR:     PHYSICAL EXAM General: Well developed, well nourished, in no acute distress Head: Eyes PERRLA, No xanthomas.   Normal cephalic and atramatic  Lungs:   Clear bilaterally to auscultation and percussion. Heart:   HRRR S1 S2 Pulses are 2+ & equal. Abdomen: Bowel sounds are positive, abdomen soft and non-tender without masses Extremities:   No clubbing, cyanosis or edema.  DP +1 Neuro: Alert and oriented X 3. Psych:  Good affect, responds appropriately   LABS: Basic Metabolic Panel:  Recent Labs  05/14/15 0940 05/15/15 1013  NA 137 140  K 4.0 3.9  CL 104 105  CO2 28 28  GLUCOSE 112* 125*  BUN 16 19  CREATININE 0.95 1.12  CALCIUM 9.3 9.3   Liver Function Tests: No results for input(s): AST, ALT, ALKPHOS, BILITOT, PROT, ALBUMIN in the last 72 hours. No results for input(s): LIPASE, AMYLASE in the last 72 hours. CBC:  Recent Labs  05/14/15 0940  WBC 5.6  HGB 13.2  HCT 39.2  MCV 93.1  PLT 187   Cardiac Enzymes: No results for input(s): CKTOTAL, CKMB, CKMBINDEX, TROPONINI in the last 72 hours. BNP: Invalid input(s): POCBNP D-Dimer: No results for input(s): DDIMER in the last 72 hours. Hemoglobin A1C: No results for input(s): HGBA1C in the last 72 hours. Fasting Lipid Panel: No results for input(s): CHOL, HDL, LDLCALC, TRIG, CHOLHDL, LDLDIRECT in the last 72  hours. Thyroid Function Tests: No results for input(s): TSH, T4TOTAL, T3FREE, THYROIDAB in the last 72 hours.  Invalid input(s): FREET3 Anemia Panel: No results for input(s): VITAMINB12, FOLATE, FERRITIN, TIBC, IRON, RETICCTPCT in the last 72 hours. Coag Panel:   Lab Results  Component Value Date   INR 1.26 05/14/2015   INR 1.19 01/09/2015   INR 1.23 01/06/2015    RADIOLOGY: Dg Chest 2 View  05/11/2015   CLINICAL DATA:  Right-sided chest pain and shortness of breath tonight.  EXAM: CHEST  2 VIEW  COMPARISON:  02/10/2015  FINDINGS: Borderline heart size and pulmonary vascular congestion demonstrating improvement since previous study. No focal airspace disease or edema. No consolidation. No blunting of costophrenic angles. No pneumothorax. Mild hyperinflation. Mediastinal contours appear intact. Degenerative changes in the shoulders.  IMPRESSION: Improving congestive changes without significant edema or consolidation.   Electronically Signed   By: Lucienne Capers M.D.   On: 05/11/2015 03:46   US Abdomen Complete  05/13/2015   CLINICAL DATA:  Chest pain 4 days.  EXAM: ULTRASOUND ABDOMEN COMPLETE  COMPARISON:  None.  FINDINGS: Gallbladder: 3.4 cm stone present. No gallbladder wall thickening. Negative sonographic Murphy sign. No adjacent free fluid.  Common bile duct: Diameter: 3.6 mm.  Liver: No focal lesion  identified. Within normal limits in parenchymal echogenicity.  IVC: No abnormality visualized.  Pancreas: Visualized portion unremarkable.  Spleen: Size and appearance within normal limits.  Right Kidney: Length: 11.7 cm. Echogenicity within normal limits. No mass or hydronephrosis visualized.  Left Kidney: Length: 13.1 cm. Echogenicity within normal limits. No mass or hydronephrosis visualized.  Abdominal aorta: No aneurysm visualized.  Other findings: None.  IMPRESSION: No acute hepatobiliary findings.  3.4 cm gallstone without additional sonographic evidence to suggest cholecystitis.    Electronically Signed   By: Marin Olp M.D.   On: 05/13/2015 09:55   Nm Myocar Multi W/spect W/wall Motion / Ef  05/11/2015   CLINICAL DATA:  Chest pain and shortness of Breath. History of prior coronary artery stents.  EXAM: MYOCARDIAL IMAGING WITH SPECT (REST AND EXERCISE)  GATED LEFT VENTRICULAR WALL MOTION STUDY  LEFT VENTRICULAR EJECTION FRACTION  TECHNIQUE: Standard myocardial SPECT imaging was performed after resting intravenous injection of 10 mCi Tc-71m sestamibi. Subsequently, exercise tolerance test was performed by the patient under the supervision of the Cardiology staff. At peak-stress, 30 mCi Tc-73m sestamibi was injected intravenously and standard myocardial SPECT imaging was performed. Quantitative gated imaging was also performed to evaluate left ventricular wall motion, and estimate left ventricular ejection fraction.  COMPARISON:  None.  FINDINGS: Perfusion: Large fixed defect involving the inferior wall consistent with infarctions/scar. No reversible defects to suggest ischemia.  Wall Motion: Inferior wall motion abnormality due to scar. There is also global hypokinesis  Left Ventricular Ejection Fraction: 34 %  End diastolic volume 546 ml  End systolic volume 270 ml  IMPRESSION: 1. 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 No reversible defects to suggest ischemia.  2. Global hypokinesis.  3. Left ventricular ejection fraction 34%  4. High-risk stress test findings*.  *2012 Appropriate Use Criteria for Coronary Revascularization Focused Update: J Am Coll Cardiol. 3500;93(8):182-993. http://content.[email or web address].aspx?articleid=1201161   Electronically Signed   By: Marijo Sanes M.D.   On: 05/11/2015 13:44   ASSESSMENT AND PLAN:  1. Chest Pain/Unstable angina: Cardiac enzymes negative x 3. NST 05/11/15 showed no ischemia, known scar. LVEF is down but this is consistent with prior echocardiograms over the years with known scar. Pt continues to have  episodes of chest pain. GI workup including EGD is unrevealing and there is no symptomatic improvement with PPI therapy. Cath showed nonobstructive ASCAD with patent stents. Borderline stenosis in the mid- distal LAD. This lesion was unchanged from prior studies. FFR is 0.82 in an intermediate range. Based on these findings it was felt that lesion did not explain his resting chest pain. Normal LV EDP. There is felt to be an anxiety component to his CP and recommended that if pain continues, to consider SSRI. His CP has resolved.  2. CAD: s/p PCI to LAD and RCA in 2014. Patent stents on most cath 12/2014 and 05/14/2015. Continue, ASA, Plavix, BB and nitrate.   3. HTN: BP stable.   4. HLD: continue Welchol.   5. 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 Patient asymptomatic. EF 34% on nuc. I will get an echo today to confirm EF since last echo in 01/2015 showed normal LVF. Discussed with Dr. Lovena Le who recommend echo today.  If EF < 35% then will need reassessment of EF by echo in 3 months on maximum medical therapy.  OK to d/c home today after echo.     Sueanne Margarita, MD  05/16/2015  8:13 AM

## 2015-05-16 NOTE — Progress Notes (Signed)
  Echocardiogram 2D Echocardiogram has been performed.  Kenneth [name] 05/16/2015, 1:34 PM

## 2015-05-16 NOTE — Progress Notes (Signed)
Pt discharge education and instructions completed with pt; pt denies any questions and voices understanding. Pt discharge home with family to transport him home. Pt to pick up electronically sent prescriptions from preferred pharmacy on file. Pt IV and telemetry removed. Pt transported off unit via wheelchair with belongings to the side. Francis Gaines Rober Skeels RN.

## 2015-05-16 NOTE — Consult Note (Signed)
Reason for Consult: Nonsustained ventricular tachycardia  Referring Physician: Dr. Barney Drain is an 69 y.o. male.   HPI: The patient is a 69 year old man with an extensive history of coronary artery disease, status post multiple stenting's. The patient was admitted to the hospital with chest pressure. Previously his 2-D echo demonstrated an ejection fraction of 40-45%. He underwent stress testing and this demonstrated an ejection fraction of 34%. There is no new ischemia. On the cardiac monitor, he has had nonsustained ventricular tachycardia which is asymptomatic. He will know rare palpitations. He has had no syncope. He has class II heart failure symptoms. He denies peripheral edema. He does have some anxiety regarding his coronary disease as well as PTSD from [place] in [place].  [place]: Past Medical History  Diagnosis Date  . Hypertension   . Hyperlipidemia   . Tobacco abuse   . CAD S/P percutaneous coronary angioplasty 1999; 2014    a. 1999: s/p PTCA;  b. 11/2012 Cath/PCI: LM mild plaque, LAD 60-37m, LCX 30p, 63m, OM1 18m, RCA 30-40ost, 40-66m, 80-5m/d (3.0x38, 3.0x32, 3.0x24 Promus Premier DESs), 70-80d, EF 40% inf HK;  c. 12/2012 Staged PCI of LAD w/ 3.0x12 Promus Premier DES. d. Cath 3/'16: patent stents, mild LAD & OM3 disease, normal RHC Pressures, EF 35-40%  . Chronic diastolic heart failure, NYHA class 2 09/18/2013  . Squamous cell carcinoma     Back  . Myocardial infarction     '99- MI,"angioplasty", Stents 2& 3'14  . H/O seasonal allergies   . Elevated uric acid in blood   . CHF (congestive heart failure)   . Anginal pain   . Type II diabetes mellitus     "A1C fine since 2012" (05/11/2015)  . Osteoarthritis     "-knees, hands, wrist, fingers" (05/11/2015)  . History of gout   . Depression 1990's    "stopped RX after having suicidal thoughts" (05/11/2015)  . PTSD (post-traumatic stress disorder)     "[name]"    PSHX: Past Surgical History    Procedure Laterality Date  . Carpal tunnel release Left 2000's  . Ankle reconstruction Right 1960    "had to break then rebuild"  . Total knee arthroplasty Right 06/08/2014    Procedure: RIGHT TOTAL KNEE ARTHROPLASTY;  Surgeon: Gearlean Alf, MD;  Location: [facility] ORS;  Service: Orthopedics;  Laterality: Right;  . Left heart catheterization with coronary angiogram N/A 12/27/2012    Procedure: LEFT HEART CATHETERIZATION WITH CORONARY ANGIOGRAM;  Surgeon: Jolaine Artist, MD;  Location: [facility] CATH LAB;  Service: Cardiovascular;  LM mild plaque, LAD 60-51m, LCX 30p, 46m, OM1 48m, RCA 30-40ost, 40-81m, 80-57m/d , 70-80d, EF 40%  . Percutaneous coronary stent intervention (pci-s) N/A 12/30/2012    Procedure: PERCUTANEOUS CORONARY STENT INTERVENTION (PCI-S) - RCA;  Surgeon: Peter M Martinique, MD;  Location: [facility] CATH LAB;  Service: Cardiovascular; 3.0x38, 3.0x32, 3.0x24 Promus Premier DESs   . Percutaneous coronary stent intervention (pci-s) N/A 01/14/2013    PERCUTANEOUS CORONARY STENT INTERVENTION (PCI-S);  Surgeon: Peter M Martinique, MD;  Location: [facility] CATH LAB;  Service: Cardiovascular;  Staged PCI of LAD w/ 3.0x12 Promus Premier DES.  Marland Kitchen Left and right heart catheterization with coronary angiogram N/A 01/12/2015    LEFT AND RIGHT HEART CATHETERIZATION WITH CORONARY ANGIOGRAM;  Surgeon: Peter M Martinique, MD;  Location: [facility] CATH LAB;  Service: Cardiovascular;  Normal Right Heart Pressures. CO/CI (Fick) 5.27 L/min, 2.6 L/min/m2; LAD: mod Ca2+, pLAD 30%, mLAD Stentwidely patent\, m-dLAD ~40%;Cx:2  OMB, terminates as large OM3.  30% small OM1 & m-d Cx;Large, dominant. Prox, mid & distal stents patent ; EF ~35-40%  . Transthoracic echocardiogram  3/15/'216; 4/14/'16    a. 3/'16: EF 45%, basal to mid inferior akinesis and inferolateral to anterolateral hypokinesis. Grade 2 DD. 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 4/'16: EF 55% with basal inferior and mild posterior HK, Mild LVH. Mild-mod MR. Mild LA  and RA dilation   . Inguinal hernia repair Right 2004 ?    w/mesh  . Joint replacement    . Coronary angioplasty  1999  . Coronary angioplasty with stent placement    . Squamous cell carcinoma excision  ~ 1997    "back"  . Esophagogastroduodenoscopy N/A 05/13/2015    Procedure: ESOPHAGOGASTRODUODENOSCOPY (EGD);  Surgeon: Jerene Bears, MD;  Location: [facility] ENDOSCOPY;  Service: Endoscopy;  Laterality: N/A;  . Cardiac catheterization N/A 05/14/2015    Procedure: Left Heart Cath and Coronary Angiography;  Surgeon: Peter M Martinique, MD;  Location: [facility] CV LAB;  Service: Cardiovascular;  Laterality: N/A;    FAMHX: Family History  Problem Relation Age of Onset  . Arrhythmia Sister     s/p PPM  . Alzheimer's disease Mother   . Hypertension Mother   . Diabetes Mother   . CVA Father     Social History:  reports that he has been smoking Cigars.  He has never used smokeless tobacco. He reports that he drinks about 3.6 oz of alcohol per week. He reports that he does not use illicit drugs.  Allergies:  Allergies  Allergen Reactions  . Crestor [Rosuvastatin]     Muscle aches at Crestor 5 mg daily  . Lipitor [Atorvastatin]     Muscle aches at 20 mg daily   . Pravastatin Sodium     Muscle aches at 20 mg and 40 mg daily  . Zocor [Simvastatin-High Dose]     Muscle aches at 20 mg and 40 mg daily    Medications: I have reviewed the patient's current medications.  No results found.  ROS  As stated in the HPI and negative for all other systems.  Physical Exam  Vitals:Blood pressure 114/76, pulse 62, temperature 98.2 F (36.8 C), temperature source Oral, resp. rate 16, height 5\' 8"  (1.727 m), weight 198 lb 13.7 oz (90.2 kg), SpO2 97 %.  Well appearing middle-aged man, NAD HEENT: Unremarkable Neck:  6 cm JVD, no thyromegally Back:  No CVA tenderness Lungs:  Clear, with no wheezes, rales, or rhonchi. HEART:  Regular rate rhythm, no murmurs, no rubs, no clicks, no S4 was present. Abd:   Soft , positive bowel sounds, no organomegally, no rebound, no guarding Ext:  2 plus pulses, no edema, no cyanosis, no clubbing Skin:  No rashes no nodules Neuro:  CN II through XII intact, motor grossly intact  ECG - normal sinus rhythm, PVCs  Chest x-ray - reviewed  Nuclear stress test - reviewed  Assessment/Plan: 1. Nonsustained ventricular tachycardia - he is instructed to continue his beta blocker therapy. There is currently no indication for anti-rhythmic drug therapy 2. Chronic ischemic heart disease - he is on maximal medical therapy. I encouraged the patient to walk on a regular basis. 3. Chronic systolic heart failure - previously, his ejection fraction was 40-45% by echo. On stress testing, his ejection fraction was 34%. He has a repeat 2-D echo pending. If his ejection fraction remains 35% or below, then I would recommend he repeat his  echo in 3-4 months. If his EF remains persistently low, then consideration for a prophylactic ICD would be a consideration. On the other hand, if his ejection fraction is 40% or higher, there would be no indication to repeat his 2-D echo in 3-4 months unless the clinical situation demanded.  Carleene Overlie TaylorMD 05/16/2015, 8:49 AM

## 2015-05-16 NOTE — Discharge Summary (Signed)
Discharge Summary   Patient ID: Kenneth [name] MRN: [id number], DOB/AGE: 1946/08/27 69 y.o. Admit date: 05/11/2015 D/C date:     05/16/2015  Primary Care Provider: Velna Hatchet, MD Primary Cardiologist: P. Martinique  Primary Discharge Diagnoses:  1. Chest pain, noncardiac, question secondary to anxiety 2. CAD s/p PTCA in 1999; s/p PCI to LAD and RCA in 2014, cath stable this admission with patent stents, nonobstructive CAD, borderline stenosis in the mid-distal LAD with FFR 0.82 (intermediate) 3. Brief NSVT 4. HTN 5. Hyperlipidemia, statin intolerant, on fibrate 6. H/o intermittent LV dysfunction  PMH:  Past Medical History  Diagnosis Date  . Hypertension   . Hyperlipidemia     a. Statin intolerant, on fibrate.  . Tobacco abuse   . CAD S/P percutaneous coronary angioplasty 1999; 2014    a. 1999: s/p PTCA;  b. 11/2012 Cath/PCI: s/p DES to RCA, EF 40% inf HK;  c. 12/2012 Staged PCI of LAD w/ 3.0x12 Promus Premier DES. d. Cath 3/16: patent stents, EF 35-40%. e. 04/2015: cath stable - patent stents, nonobstructive CAD, borderline stenosis in the mid-distal LAD with FFR 0.82 (intermediate).  . Chronic diastolic CHF (congestive heart failure) 09/18/2013  . Squamous cell carcinoma     Back  . Myocardial infarction     '99- MI,"angioplasty", Stents 2& 3'14  . H/O seasonal allergies   . Elevated uric acid in blood   . Type II diabetes mellitus     "A1C fine since 2012" (05/11/2015)  . Osteoarthritis     "-knees, hands, wrist, fingers" (05/11/2015)  . History of gout   . Depression 1990's    "stopped RX after having suicidal thoughts" (05/11/2015)  . PTSD (post-traumatic stress disorder)     "[name]"  . LV dysfunction     a. Varying EF from 35-40%, 40-45% on prior studies.    Hospital Course: [name] is a 69 y/o M with history of CAD (s/p PTCA in 1999, PCI to LAD and RCA in 2014), HTN, HLD (statin intolerant, patient d/c'd Praluent), chronic diastolic CHF also with history of  intermittent LV dysfunction, PTSD who presented to [facility] with chest pain. Most recent cardiac catheterization was in March of this year due to concern for possible ischemic mitral regurgitation that showed mild to moderate CAD with patent stents & normal right heart pressures. He has had 2 episodes this year in March and April that appeared to have been related to acute on chronic diastolic heart failure. He was evaluated for what was concerning for mild to severe MR that turned out to likely be functional as noted above. He was seen in the office 02/2015 at which time his Imdur was reduced due to softer BPs. He had been doing well recently until he presented with substernal chest discomfort similar to prior angina. He described it as a tight squeezing sensation that lasted most of the evening intermittently. He did notice some gas and belching sensation as well, but also noted feeling clammy with sweaty palms which is unusual for him. He did not notice profound dyspnea like he did in the spring, but did note a sensation of orthopnea with almost worsening of his chest discomfort. His symptoms progressed to the point where he became concerned as there was no relief with sublingual nitroglycerin, prompting ER evaluation. Initial troponin was negative and EKG relatively benign. He was admitted for further evaluation. Treadmill stress test was formed with result "1. 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  No reversible defects to suggest ischemia. 2. Global hypokinesis. 3. Left ventricular ejection fraction 34%. 4. High-risk stress test findings*. Initially the plan was to recheck echo and compare to prior given LV dysfunction on this study. However, Dr. Angelena Form reviewed prior echoes and noted his LVEF is around 40-45% on all other echoes except for most recently which may have overestimated EF. He felt the stress test was likely indicative of prior known scar and considered tht  the patient's symptoms were possibly GI related. GI consult was obtained. They performed EGD which showed no GI source of chest pain. There was mild antral gastropathy noted and biopsies were sent to exclude H pylori. Abdominal US was without cholecystitis.  The patient continued to have some chest discomfort. Since GI workup was unrevealing, it was ultimately decided to proceed with cath given equivocal stress results. Cardiac cath was performed 05/14/15 showing nonobstructive CAD with multiple prior patent stents, no change from 12/2014. He had borderline stenosis in the mid-distal LAD, lesion unchanged from prior studies, with FFR 0.82 in the intermediate range. Based on these findings Dr. Martinique did not feel this lesion explains his resting chest pain. He had normal LVEDP. Dr. Martinique felt there appeared to be an anxiety component to his symptoms and recommended that if pain continues would consider SSRI. Post-cath he did not have any further chest pain. He was noted to have one episode of NSVT (18 beats) on telemetry. BB was increased. He did report rare palpitations but had not had any presyncope or syncope. Dr. Lovena Le with EP consulted on the patient and recommended to continue his BB for now. He felt there was currently no indication for anti-arrhythmic therapy. He recommended to recheck echo to clarify LV function. If his ejection fraction remains 35% or below, then he would recommend he repeat echo in 3-4 months. If EF remains persistently low, then consideration for a prophylactic ICD would be a consideration. On the other hand, if his ejection fraction is 40% or higher, there would be no indication to repeat his 2-D echo in 3-4 months unless the clinical situation demanded. 2D echo will be obtained prior to discharge. Please see that report. We will make decisions at his follow-up appointment about timing of repeat. His Imdur and Coreg were titrated this admission. I have sent a message to our office's  scheduler requesting a follow-up appointment, and our office will call the patient with this information. Dr. Radford Pax has seen and examined the patient today and feels he is stable for discharge.  Discharge Vitals: Blood pressure 129/82, pulse 66, temperature 98.2 F (36.8 C), temperature source Oral, resp. rate 16, height 5\' 8"  (1.727 m), weight 198 lb 13.7 oz (90.2 kg), SpO2 100 %.  Labs: Lab Results  Component Value Date   WBC 5.6 05/14/2015   HGB 13.2 05/14/2015   HCT 39.2 05/14/2015   MCV 93.1 05/14/2015   PLT 187 05/14/2015    Recent Labs Lab 05/15/15 1013  NA 140  K 3.9  CL 105  CO2 28  BUN 19  CREATININE 1.12  CALCIUM 9.3  GLUCOSE 125*    Lab Results  Component Value Date   CHOL 188 01/09/2015   HDL 31* 01/09/2015   LDLCALC 117* 01/09/2015   TRIG 199* 01/09/2015   No results found for: DDIMER  Diagnostic Studies/Procedures   Dg Chest 2 View  05/11/2015   CLINICAL DATA:  Right-sided chest pain and shortness of breath tonight.  EXAM: CHEST  2 VIEW  COMPARISON:  02/10/2015  FINDINGS: Borderline heart size and pulmonary vascular congestion demonstrating improvement since previous study. No focal airspace disease or edema. No consolidation. No blunting of costophrenic angles. No pneumothorax. Mild hyperinflation. Mediastinal contours appear intact. Degenerative changes in the shoulders.  IMPRESSION: Improving congestive changes without significant edema or consolidation.   Electronically Signed   By: Lucienne Capers M.D.   On: 05/11/2015 03:46   US Abdomen Complete  05/13/2015   CLINICAL DATA:  Chest pain 4 days.  EXAM: ULTRASOUND ABDOMEN COMPLETE  COMPARISON:  None.  FINDINGS: Gallbladder: 3.4 cm stone present. No gallbladder wall thickening. Negative sonographic Murphy sign. No adjacent free fluid.  Common bile duct: Diameter: 3.6 mm.  Liver: No focal lesion identified. Within normal limits in parenchymal echogenicity.  IVC: No abnormality visualized.  Pancreas:  Visualized portion unremarkable.  Spleen: Size and appearance within normal limits.  Right Kidney: Length: 11.7 cm. Echogenicity within normal limits. No mass or hydronephrosis visualized.  Left Kidney: Length: 13.1 cm. Echogenicity within normal limits. No mass or hydronephrosis visualized.  Abdominal aorta: No aneurysm visualized.  Other findings: None.  IMPRESSION: No acute hepatobiliary findings.  3.4 cm gallstone without additional sonographic evidence to suggest cholecystitis.   Electronically Signed   By: Marin Olp M.D.   On: 05/13/2015 09:55   Nm Myocar Multi W/spect W/wall Motion / Ef  05/11/2015   CLINICAL DATA:  Chest pain and shortness of Breath. History of prior coronary artery stents.  EXAM: MYOCARDIAL IMAGING WITH SPECT (REST AND EXERCISE)  GATED LEFT VENTRICULAR WALL MOTION STUDY  LEFT VENTRICULAR EJECTION FRACTION  TECHNIQUE: Standard myocardial SPECT imaging was performed after resting intravenous injection of 10 mCi Tc-48m sestamibi. Subsequently, exercise tolerance test was performed by the patient under the supervision of the Cardiology staff. At peak-stress, 30 mCi Tc-73m sestamibi was injected intravenously and standard myocardial SPECT imaging was performed. Quantitative gated imaging was also performed to evaluate left ventricular wall motion, and estimate left ventricular ejection fraction.  COMPARISON:  None.  FINDINGS: Perfusion: Large fixed defect involving the inferior wall consistent with infarctions/scar. No reversible defects to suggest ischemia.  Wall Motion: Inferior wall motion abnormality due to scar. There is also global hypokinesis  Left Ventricular Ejection Fraction: 34 %  End diastolic volume 426 ml  End systolic volume 834 ml  IMPRESSION: 1. 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 No reversible defects to suggest ischemia.  2. Global hypokinesis.  3. Left ventricular ejection fraction 34%  4. High-risk stress test findings*.  *2012  Appropriate Use Criteria for Coronary Revascularization Focused Update: J Am Coll Cardiol. 1962;22(9):798-921. http://content.airportbarriers.com.aspx?articleid=[email or web address]   Electronically Signed   By: Marijo Sanes M.D.   On: 05/11/2015 13:44   2D Echo - see report.  Discharge Medications   Current Discharge Medication List    CONTINUE these medications which have CHANGED   Details  carvedilol (COREG) 6.25 MG tablet Take 1 tablet (6.25 mg total) by mouth 2 (two) times daily with a meal. Qty: 60 tablet, Refills: 3    isosorbide mononitrate (IMDUR) 30 MG 24 hr tablet Take 1 tablet (30 mg total) by mouth 2 (two) times daily. Qty: 60 tablet, Refills: 3      CONTINUE these medications which have NOT CHANGED   Details  allopurinol (ZYLOPRIM) 300 MG tablet Take 300 mg by mouth daily.    aspirin EC 81 MG tablet Take 81 mg by mouth daily.    cetirizine (ZYRTEC) 10 MG tablet Take 10 mg by mouth daily  as needed for allergies.     clopidogrel (PLAVIX) 75 MG tablet Take 75 mg by mouth daily.     colchicine 0.6 MG tablet Take 0.6 mg by mouth daily as needed (Gout).    colesevelam (WELCHOL) 625 MG tablet Take 1 tablet (625 mg total) by mouth 2 (two) times daily with a meal.     fluticasone (FLONASE) 50 MCG/ACT nasal spray Place 2 sprays into the nose daily as needed for allergies.     furosemide (LASIX) 40 MG tablet Take 1 tablet (40 mg total) by mouth daily.     KLOR-CON M20 20 MEQ tablet Take 20 mEq by mouth daily.    Associated Diagnoses: Hyperlipidemia; Essential hypertension; Chronic combined systolic and diastolic CHF (congestive heart failure); Dyspnea    lisinopril (PRINIVIL,ZESTRIL) 40 MG tablet Take 20 mg by mouth every morning.    Multiple Vitamin (MULTIVITAMIN) tablet Take 1 tablet by mouth daily.    nitroGLYCERIN (NITROSTAT) 0.4 MG SL tablet Place 1 tablet (0.4 mg total) under the tongue every 5 (five) minutes x 3 doses as needed for chest pain.     Probiotic Product  (PROBIOTIC DAILY PO) Take 1 capsule by mouth daily.    tetrahydrozoline 0.05 % ophthalmic solution Place 1 drop into both eyes 2 (two) times daily as needed (Itchy eyes).      STOP taking these medications     Alirocumab (PRALUENT) 75 MG/ML SOPN - patient indicated he self discontinued         Disposition   The patient will be discharged in stable condition to home. Discharge Instructions    Diet - low sodium heart healthy    Complete by:  As directed      Increase activity slowly    Complete by:  As directed   No driving for 2 days. No lifting over 5 lbs for 1 week. No sexual activity for 1 week. Keep procedure site clean & dry. If you notice increased pain, swelling, bleeding or pus, call/return!  You may shower, but no soaking baths/hot tubs/pools for 1 week.          Follow-up Information    Follow up with Peter Martinique, MD.   Specialty:  Cardiology   Why:  Office will call you for your followup appointment. Call office if you have not heard back in 3 days.   Contact information:   [place] [address] [phone]         Duration of Discharge Encounter: Greater than 30 minutes including physician and PA time.  Rudean Hitt, Angelys Yetman PA-C 05/16/2015, 11:08 AM

## 2015-05-17 ENCOUNTER — Telehealth: Payer: Self-pay | Admitting: Cardiology

## 2015-05-17 NOTE — Telephone Encounter (Signed)
Patient contacted regarding discharge from [facility] on 05/16/2015.  Patient understands to follow up with provider Dr Martinique on 06/16/2015 at 11:30a at [facility]. Patient understands discharge instructions? yes Patient understands medications and regiment? yes Patient understands to bring all medications to this visit? yes

## 2015-05-17 NOTE — Telephone Encounter (Signed)
D/C phone Call .Marland Kitchen Appt on 08/17 w/ Dr. Martinique   Thanks

## 2015-05-25 ENCOUNTER — Telehealth: Payer: Self-pay | Admitting: Cardiology

## 2015-05-25 NOTE — Telephone Encounter (Signed)
I spoke with patient.  He had 18 beats of NSVT, EP was consulted and beta blocker was increased.  EP did not want to proceed with any other testing at this time. Patient voiced understanding.  I advised patient to contact us if he develops any palpitations or symptoms.

## 2015-05-25 NOTE — Telephone Encounter (Signed)
Kenneth [name] is calling because his insurance company called him to find out why he was not put on a holter after being in the hospital. Please call .Marland Kitchen   Thanks

## 2015-06-16 ENCOUNTER — Ambulatory Visit (INDEPENDENT_AMBULATORY_CARE_PROVIDER_SITE_OTHER): Payer: Commercial Managed Care - HMO | Admitting: Cardiology

## 2015-06-16 ENCOUNTER — Encounter: Payer: Self-pay | Admitting: Cardiology

## 2015-06-16 VITALS — BP 110/70 | HR 68 | Ht 68.0 in | Wt 197.0 lb

## 2015-06-16 DIAGNOSIS — I340 Nonrheumatic mitral (valve) insufficiency: Secondary | ICD-10-CM | POA: Diagnosis not present

## 2015-06-16 DIAGNOSIS — E785 Hyperlipidemia, unspecified: Secondary | ICD-10-CM | POA: Diagnosis not present

## 2015-06-16 DIAGNOSIS — Z9861 Coronary angioplasty status: Secondary | ICD-10-CM

## 2015-06-16 DIAGNOSIS — I10 Essential (primary) hypertension: Secondary | ICD-10-CM | POA: Diagnosis not present

## 2015-06-16 DIAGNOSIS — I2510 Atherosclerotic heart disease of native coronary artery without angina pectoris: Secondary | ICD-10-CM | POA: Diagnosis not present

## 2015-06-16 DIAGNOSIS — I5042 Chronic combined systolic (congestive) and diastolic (congestive) heart failure: Secondary | ICD-10-CM

## 2015-06-16 NOTE — Patient Instructions (Signed)
Continue your current therapy  I will see you again in 4 months. 

## 2015-06-16 NOTE — Progress Notes (Signed)
CARDIOLOGY OFFICE NOTE  Date:  06/16/2015    Kenneth [name] Date of Birth: 1946-05-30 Medical Record #[id number]  PCP:  Kenneth Hatchet, MD  Cardiologist:  Kenneth [name]  Chief Complaint  Patient presents with  . Follow-up    Patient has had some chest pain/pressure.     History of Present Illness: Kenneth [name] is a 69 y.o. male seen for follow up CHF and CAD. 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 Other issues include HTN, HLD, tobacco abuse, statin intolerance and DM.  Patient had developed exertional dyspnea earlier this year. Echo in 3/16 showed EF 45% with wall motion abnormalities and moderate-severe  MR. He saw PCP and Lasix was increased to 40 mg bid. He then had right and left heart cath in 3/16 showing nonobstructive CAD (patent stents) and normal filling pressures. MR looked mild to moderate by LV-gram. Lasix was then decreased to 40 mg daily.   On 02/10/15 he was admitted with acute respiratory failure and acute pulmonary edema. BP was very high initially, SBP > 200. He was started on NTG gtt. CXR with pulmonary edema. He was diuresed.Prior Echo was  reviewed by Dr. Aundra [name] due to concern for MR - the MR was eccentric and likely ischemic (related to wall motion abnormalities with posterior leaflet tethering). Rated moderate to severe with PISA ERO 0.39 cm^2. It was Dr. Claris [name] suspicion that the MR actually is moderate based on the recent cath. It was possibly felt that rise in BP (such as with pulmonary edema on admission) can make the MR worse and potentiate CHF. Repeat Echo was done on 02/11/15 with mild to moderate MR and EF 55%. He was diuresed and placed on Imdur. Coreg and lisinopril were titrated.  He was readmitted in July with an episode of chest pain. He ruled out for MI. Repeat cardiac cath 05/15/15 showed nonobstructive disease. Prior stents patent. FFR of distal LAD moderate lesion was 0.82. He had  one episode of NSVt 18 beats. Beta blocker was increased. Echo showed EF 40-45%. Since then he has done well. Denies any dyspnea. Back playing golf. Has some mild right chest pain at times not associated with activity. Has seen primary care and started on Lexapro for anxiety.   Past Medical History  Diagnosis Date  . Hypertension   . Hyperlipidemia     a. Statin intolerant, on fibrate.  . Tobacco abuse   . CAD S/P percutaneous coronary angioplasty 1999; 2014    a. 1999: s/p PTCA;  b. 11/2012 Cath/PCI: s/p DES to RCA, EF 40% inf HK;  c. 12/2012 Staged PCI of LAD w/ 3.0x12 Promus Premier DES. d. Cath 3/16: patent stents, EF 35-40%. e. 04/2015: cath stable - patent stents, nonobstructive CAD, borderline stenosis in the mid-distal LAD with FFR 0.82 (intermediate).  . Chronic diastolic CHF (congestive heart failure) 09/18/2013  . Squamous cell carcinoma     Back  . Myocardial infarction     '99- MI,"angioplasty", Stents 2& 3'14  . H/O seasonal allergies   . Elevated uric acid in blood   . Type II diabetes mellitus     "A1C fine since 2012" (05/11/2015)  . Osteoarthritis     "-knees, hands, wrist, fingers" (05/11/2015)  . History of gout   . Depression 1990's    "stopped RX after having suicidal thoughts" (05/11/2015)  . PTSD (post-traumatic stress disorder)     "Kenneth [name]"  . LV dysfunction  a. Varying EF from 35-40%, 40-45% on prior studies.    Past Surgical History  Procedure Laterality Date  . Carpal tunnel release Left 2000's  . Ankle reconstruction Right 1960    "had to break then rebuild"  . Total knee arthroplasty Right 06/08/2014    Procedure: RIGHT TOTAL KNEE ARTHROPLASTY;  Surgeon: Gearlean Alf, MD;  Location: [facility];  Service: Orthopedics;  Laterality: Right;  . Left heart catheterization with coronary angiogram N/A 12/27/2012    Procedure: LEFT HEART CATHETERIZATION WITH CORONARY ANGIOGRAM;  Surgeon: Jolaine Artist, MD;  Location: [facility] CATH LAB;  Service: Cardiovascular;   LM mild plaque, LAD 60-26m, LCX 30p, 71m, OM1 24m, RCA 30-40ost, 40-60m, 80-41m/d , 70-80d, EF 40%  . Percutaneous coronary stent intervention (pci-s) N/A 12/30/2012    Procedure: PERCUTANEOUS CORONARY STENT INTERVENTION (PCI-S) - RCA;  Surgeon: Peter M Martinique, MD;  Location: [facility] CATH LAB;  Service: Cardiovascular; 3.0x38, 3.0x32, 3.0x24 Promus Premier DESs   . Percutaneous coronary stent intervention (pci-s) N/A 01/14/2013    PERCUTANEOUS CORONARY STENT INTERVENTION (PCI-S);  Surgeon: Peter M Martinique, MD;  Location: [facility] CATH LAB;  Service: Cardiovascular;  Staged PCI of LAD w/ 3.0x12 Promus Premier DES.  Marland Kitchen Left and right heart catheterization with coronary angiogram N/A 01/12/2015    LEFT AND RIGHT HEART CATHETERIZATION WITH CORONARY ANGIOGRAM;  Surgeon: Peter M Martinique, MD;  Location: [facility] CATH LAB;  Service: Cardiovascular;  Normal Right Heart Pressures. CO/CI (Fick) 5.27 L/min, 2.6 L/min/m2; LAD: mod Ca2+, pLAD 30%, mLAD Stentwidely patent\, m-dLAD ~40%;Cx:2 OMB, terminates as large OM3.  30% small OM1 & m-d Cx;Large, dominant. Prox, mid & distal stents patent ; EF ~35-40%  . Transthoracic echocardiogram  3/15/'216; 4/14/'16    a. 3/'16: EF 45%, basal to mid inferior akinesis and inferolateral to anterolateral hypokinesis. Grade 2 DD. 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 4/'16: EF 55% with basal inferior and mild posterior HK, Mild LVH. Mild-mod MR. Mild LA and RA dilation   . Inguinal hernia repair Right 2004 ?    w/mesh  . Joint replacement    . Coronary angioplasty  1999  . Coronary angioplasty with stent placement    . Squamous cell carcinoma excision  ~ 1997    "back"  . Esophagogastroduodenoscopy N/A 05/13/2015    Procedure: ESOPHAGOGASTRODUODENOSCOPY (EGD);  Surgeon: Jerene Bears, MD;  Location: [facility] ENDOSCOPY;  Service: Endoscopy;  Laterality: N/A;  . Cardiac catheterization N/A 05/14/2015    Procedure: Left Heart Cath and Coronary Angiography;  Surgeon: Peter M Martinique,  MD;  Location: [place] CV LAB;  Service: Cardiovascular;  Laterality: N/A;     Medications: Current Outpatient Prescriptions  Medication Sig Dispense Refill  . allopurinol (ZYLOPRIM) 300 MG tablet Take 300 mg by mouth daily.    Marland Kitchen aspirin EC 81 MG tablet Take 81 mg by mouth daily.    . carvedilol (COREG) 6.25 MG tablet Take 1 tablet (6.25 mg total) by mouth 2 (two) times daily with a meal. 60 tablet 3  . cetirizine (ZYRTEC) 10 MG tablet Take 10 mg by mouth daily as needed for allergies.     Marland Kitchen clopidogrel (PLAVIX) 75 MG tablet Take 75 mg by mouth daily.     . colchicine 0.6 MG tablet Take 0.6 mg by mouth daily as needed (Gout).    . colesevelam (WELCHOL) 625 MG tablet Take 1 tablet (625 mg total) by mouth 2 (two) times daily with a meal. 30 tablet 5  . escitalopram (  LEXAPRO) 10 MG tablet Take 10 mg by mouth daily. Take 1-2 tablets in the evening.  3  . fluticasone (FLONASE) 50 MCG/ACT nasal spray Place 2 sprays into the nose daily as needed for allergies.     . furosemide (LASIX) 40 MG tablet Take 1 tablet (40 mg total) by mouth daily.    . isosorbide mononitrate (IMDUR) 30 MG 24 hr tablet Take 1 tablet (30 mg total) by mouth 2 (two) times daily. 60 tablet 3  . KLOR-CON M20 20 MEQ tablet Take 20 mEq by mouth daily.     Marland Kitchen lisinopril (PRINIVIL,ZESTRIL) 40 MG tablet Take 20 mg by mouth every morning.    . Multiple Vitamin (MULTIVITAMIN) tablet Take 1 tablet by mouth daily.    . nitroGLYCERIN (NITROSTAT) 0.4 MG SL tablet Place 1 tablet (0.4 mg total) under the tongue every 5 (five) minutes x 3 doses as needed for chest pain. 25 tablet 2  . Probiotic Product (PROBIOTIC DAILY PO) Take 1 capsule by mouth daily.    Marland Kitchen tetrahydrozoline 0.05 % ophthalmic solution Place 1 drop into both eyes 2 (two) times daily as needed (Itchy eyes).     No current facility-administered medications for this visit.    Allergies: Allergies  Allergen Reactions  . Crestor [Rosuvastatin]     Muscle aches at Crestor  5 mg daily  . Lipitor [Atorvastatin]     Muscle aches at 20 mg daily   . Pravastatin Sodium     Muscle aches at 20 mg and 40 mg daily  . Zocor [Simvastatin-High Dose]     Muscle aches at 20 mg and 40 mg daily    Social History: The patient  reports that he has been smoking Cigars.  He has never used smokeless tobacco. He reports that he drinks about 3.6 oz of alcohol per week. He reports that he does not use illicit drugs.   Family History: The patient's family history includes Alzheimer's disease in his mother; Arrhythmia in his sister; CVA in his father; Diabetes in his mother; Hypertension in his mother.   Review of Systems: Please see the history of present illness.   Some easy bruising noted.   All other systems are reviewed and negative.   Physical Exam: VS:  BP 110/70 mmHg  Pulse 68  Ht 5\' 8"  (1.727 m)  Wt 89.359 kg (197 lb)  BMI 29.96 kg/m2 .  BMI Body mass index is 29.96 kg/(m^2).  Weight is up 3 pounds since discharge.   Wt Readings from Last 3 Encounters:  06/16/15 89.359 kg (197 lb)  05/16/15 90.2 kg (198 lb 13.7 oz)  03/11/15 91.717 kg (202 lb 3.2 oz)    General: Pleasant. Well developed, well nourished and in no acute distress.  HEENT: Normal. Neck: Supple, no JVD, carotid bruits, or masses noted.  Cardiac: Regular rate and rhythm. No murmur heard on auscultation.  No edema.  Respiratory:  Lungs are clear to auscultation bilaterally with normal work of breathing.  GI: Soft and nontender.  MS: No deformity or atrophy. Gait and ROM intact. Skin: Warm and dry. Color is normal.  Neuro:  Strength and sensation are intact and no gross focal deficits noted.  Psych: Alert, appropriate and with normal affect.   LABORATORY DATA:  EKG:  EKG is not ordered today.  Lab Results  Component Value Date   WBC 5.6 05/14/2015   HGB 13.2 05/14/2015   HCT 39.2 05/14/2015   PLT 187 05/14/2015   GLUCOSE 125* 05/15/2015  CHOL 188 01/09/2015   TRIG 199* 01/09/2015   HDL  31* 01/09/2015   LDLCALC 117* 01/09/2015   ALT 47 02/10/2015   AST 37 02/10/2015   NA 140 05/15/2015   K 3.9 05/15/2015   CL 105 05/15/2015   CREATININE 1.12 05/15/2015   BUN 19 05/15/2015   CO2 28 05/15/2015   TSH 1.840 02/10/2015   INR 1.26 05/14/2015   HGBA1C 5.9* 02/10/2015    BNP (last 3 results)  Recent Labs  02/09/15 2335 05/11/15 0227  BNP 770.9* 595.6*    ProBNP (last 3 results)  Recent Labs  02/23/15 1209  PROBNP 246.0*     Other Studies Reviewed Today:  2D Echo June 05, 2015 Study Conclusions  - Left ventricle: The cavity size was normal. There was mild concentric hypertrophy. Systolic function was mildly to moderately reduced. The estimated ejection fraction was in the range of 40% to 45%. There is akinesis of the basal-midinferior myocardium. Features are consistent with a pseudonormal left ventricular filling pattern, with concomitant abnormal relaxation and increased filling pressure (grade 2 diastolic dysfunction). - Aortic valve: Trileaflet; normal thickness, mildly calcified leaflets. - Mitral valve: Calcified annulus. Mildly thickened leaflets . There was mild regurgitation directed eccentrically and posteriorly. - Left atrium: The atrium was severely dilated. - Pulmonic valve: There was trivial regurgitation.  Cardiac cath: 05/14/15: Conclusion     A drug-eluting stent was placed.  Prox RCA lesion, 20% stenosed.  RPDA lesion, 20% stenosed.  1st Mrg lesion, 30% stenosed.  Dist Cx lesion, 30% stenosed.  Ost 2nd Mrg to 2nd Mrg lesion, 30% stenosed.  Prox LAD lesion, 40% stenosed.  A drug-eluting stent was placed.  Dist LAD lesion, 55% stenosed.  1. Nonobstructive CAD. Multiple prior stents are still patent. No change from March 2016. 2. Borderline stenosis in the mid- distal LAD. This lesion is unchanged from prior studies. FFR is 0.82 in an intermediate range. Based on these findings I do not feel this lesion  explains his resting chest pain. 3. Normal LV EDP.   Plan: continue medical therapy with beta blocker, nitrates and PPI. Continue long term DAPT. There appears to be an anxiety component to his symptoms- will give lorazepam prn. Consider a SSRI if symptoms persist     Assessment/Plan:  1.  Chronic Diastolic & Systolic CHF: H/o Ischemic cardiomyopathy with prior EF of 45% by echo. Repeat echo July showed EF 40-45%.  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  Euvolemic on physical exam. Encouraged to stay on low sodium diet. Keep weighing daily. Instructed to take extra Lasix prn weight gain of 2 or more pounds overnight.   2. CAD: admitted with chest pain in July. LHC showed patent stents.  Nonobstructive CAD.  Continue ASA 81 and Plavix. Does not tolerate statins. Continue current therapy.  3. Mitral regurgitation: Probably ischemic. Moderate.   4. HTN: now well controlled.   5. HLD - statin intolerant   Current medicines are reviewed with the patient today.  The patient does not have concerns regarding medicines other than what has been noted above.  The following changes have been made:  See above.  Labs/ tests ordered today include:    Orders Placed This Encounter  Procedures  . EKG 12-Lead     Disposition:   FU with Dr. Martinique in 4 months.

## 2015-07-23 ENCOUNTER — Other Ambulatory Visit: Payer: Self-pay | Admitting: *Deleted

## 2015-07-23 ENCOUNTER — Other Ambulatory Visit: Payer: Self-pay | Admitting: Cardiology

## 2015-07-23 DIAGNOSIS — R0600 Dyspnea, unspecified: Secondary | ICD-10-CM

## 2015-07-23 DIAGNOSIS — I5042 Chronic combined systolic (congestive) and diastolic (congestive) heart failure: Secondary | ICD-10-CM

## 2015-07-23 DIAGNOSIS — I10 Essential (primary) hypertension: Secondary | ICD-10-CM

## 2015-07-23 DIAGNOSIS — E785 Hyperlipidemia, unspecified: Secondary | ICD-10-CM

## 2015-07-23 MED ORDER — KLOR-CON M20 20 MEQ PO TBCR: 20.0000 meq | EXTENDED_RELEASE_TABLET | Freq: Every day | ORAL | Status: DC

## 2015-09-06 ENCOUNTER — Other Ambulatory Visit: Payer: Self-pay | Admitting: Internal Medicine

## 2015-09-06 DIAGNOSIS — R1030 Lower abdominal pain, unspecified: Secondary | ICD-10-CM

## 2015-09-13 ENCOUNTER — Ambulatory Visit
Admission: RE | Admit: 2015-09-13 | Discharge: 2015-09-13 | Disposition: A | Discharge status: Home / Self Care | Payer: Commercial Managed Care - HMO | Source: Ambulatory Visit | Attending: Internal Medicine | Admitting: Internal Medicine

## 2015-09-13 DIAGNOSIS — R1030 Lower abdominal pain, unspecified: Secondary | ICD-10-CM

## 2015-09-16 ENCOUNTER — Ambulatory Visit (HOSPITAL_COMMUNITY)
Admission: RE | Admit: 2015-09-16 | Discharge: 2015-09-16 | Disposition: A | Discharge status: Home / Self Care | Payer: Commercial Managed Care - HMO | Source: Ambulatory Visit | Attending: Vascular Surgery | Admitting: Vascular Surgery

## 2015-09-16 ENCOUNTER — Ambulatory Visit (INDEPENDENT_AMBULATORY_CARE_PROVIDER_SITE_OTHER): Payer: Commercial Managed Care - HMO | Admitting: Cardiology

## 2015-09-16 ENCOUNTER — Other Ambulatory Visit (HOSPITAL_COMMUNITY): Payer: Self-pay | Admitting: Adult Health

## 2015-09-16 ENCOUNTER — Encounter: Payer: Self-pay | Admitting: Cardiology

## 2015-09-16 VITALS — BP 140/80 | HR 74 | Ht 68.0 in | Wt 204.8 lb

## 2015-09-16 DIAGNOSIS — R55 Syncope and collapse: Secondary | ICD-10-CM | POA: Diagnosis not present

## 2015-09-16 DIAGNOSIS — I5042 Chronic combined systolic (congestive) and diastolic (congestive) heart failure: Secondary | ICD-10-CM

## 2015-09-16 DIAGNOSIS — I10 Essential (primary) hypertension: Secondary | ICD-10-CM | POA: Diagnosis not present

## 2015-09-16 DIAGNOSIS — I340 Nonrheumatic mitral (valve) insufficiency: Secondary | ICD-10-CM

## 2015-09-16 DIAGNOSIS — E785 Hyperlipidemia, unspecified: Secondary | ICD-10-CM | POA: Diagnosis not present

## 2015-09-16 DIAGNOSIS — Z9861 Coronary angioplasty status: Secondary | ICD-10-CM

## 2015-09-16 DIAGNOSIS — I2510 Atherosclerotic heart disease of native coronary artery without angina pectoris: Secondary | ICD-10-CM

## 2015-09-16 NOTE — Progress Notes (Signed)
CARDIOLOGY OFFICE NOTE  Date:  09/16/2015    Kenneth [name] Date of Birth: Jan 30, 1946 Medical Record [id number]  PCP:  Velna Hatchet, MD  Cardiologist:  Martin[place]  Chief Complaint  Patient presents with  . Follow-up    pt states he passed out on Sunday, no chest pain no SOB no light headedness or dizziness no edema     History of Present Illness: Kenneth [name] is a 69 y.o. male seen for evaluation of syncope. 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 Other issues include HTN, HLD, tobacco abuse, statin intolerance and DM.  Patient had developed exertional dyspnea earlier this year. Echo in 3/16 showed EF 45% with wall motion abnormalities and moderate-severe  MR. He saw PCP and Lasix was increased to 40 mg bid. He then had right and left heart cath in 3/16 showing nonobstructive CAD (patent stents) and normal filling pressures. MR looked mild to moderate by LV-gram. Lasix was then decreased to 40 mg daily.   On 02/10/15 he was admitted with acute respiratory failure and acute pulmonary edema. BP was very high initially, SBP > 200. He was started on NTG gtt. CXR with pulmonary edema. He was diuresed.Prior Echo was  reviewed by Dr. Aundra Dubin due to concern for MR - the MR was eccentric and likely ischemic (related to wall motion abnormalities with posterior leaflet tethering). Rated moderate to severe with PISA ERO 0.39 cm^2. It was Dr. Claris Gladden suspicion that the MR actually is moderate based on the recent cath. It was possibly felt that rise in BP (such as with pulmonary edema on admission) can make the MR worse and potentiate CHF. Repeat Echo was done on 02/11/15 with mild to moderate MR and EF 55%. He was diuresed and placed on Imdur. Coreg and lisinopril were titrated.  He was readmitted in July with an episode of chest pain. He ruled out for MI. Repeat cardiac cath 05/15/15 showed nonobstructive disease. Prior stents  patent. FFR of distal LAD moderate lesion was 0.82. He had one episode of NSVt 18 beats. Beta blocker was increased. Echo showed EF 40-45%.   On follow up today he reports a 3-4 week history of excruciating abdominal pain. CT done as noted below. Other than this he has really been feeling great and denies any chest pain or SOB. Weight has been stable. This past Sunday he had an episode of blacking out. He had a busy weekend watching his grandson play soccer. On Sunday afternoon he was watching football and had a couple of beers to drink. He was sitting on a stool tending his fire. He scooted back on the stool and the next thing he knew he was on the floor with an abrasion on his forehead. No warning. No residual symptoms afterwards. Is not sure what happened.   Past Medical History  Diagnosis Date  . Hypertension   . Hyperlipidemia     a. Statin intolerant, on fibrate.  . Tobacco abuse   . CAD S/P percutaneous coronary angioplasty 1999; 2014    a. 1999: s/p PTCA;  b. 11/2012 Cath/PCI: s/p DES to RCA, EF 40% inf HK;  c. 12/2012 Staged PCI of LAD w/ 3.0x12 Promus Premier DES. d. Cath 3/16: patent stents, EF 35-40%. e. 04/2015: cath stable - patent stents, nonobstructive CAD, borderline stenosis in the mid-distal LAD with FFR 0.82 (intermediate).  . Chronic diastolic CHF (congestive heart failure) ([place]) 09/18/2013  . Squamous cell  carcinoma ([place])     Back  . Myocardial infarction [facility])     '99- MI,"angioplasty", Stents 2& 3'14  . H/O seasonal allergies   . Elevated uric acid in blood   . Type II diabetes mellitus ([place])     "A1C fine since 2012" (05/11/2015)  . Osteoarthritis     "-knees, hands, wrist, fingers" (05/11/2015)  . History of gout   . Depression 1990's    "stopped RX after having suicidal thoughts" (05/11/2015)  . PTSD (post-traumatic stress disorder)     "[name]"  . LV dysfunction     a. Varying EF from 35-40%, 40-45% on prior studies.    Past Surgical History  Procedure  Laterality Date  . Carpal tunnel release Left 2000's  . Ankle reconstruction Right 1960    "had to break then rebuild"  . Total knee arthroplasty Right 06/08/2014    Procedure: RIGHT TOTAL KNEE ARTHROPLASTY;  Surgeon: Gearlean Alf, MD;  Location: [facility] ORS;  Service: Orthopedics;  Laterality: Right;  . Left heart catheterization with coronary angiogram N/A 12/27/2012    Procedure: LEFT HEART CATHETERIZATION WITH CORONARY ANGIOGRAM;  Surgeon: Jolaine Artist, MD;  Location: [facility] CATH LAB;  Service: Cardiovascular;  LM mild plaque, LAD 60-26m, LCX 30p, 55m, OM1 67m, RCA 30-40ost, 40-38m, 80-19m/d , 70-80d, EF 40%  . Percutaneous coronary stent intervention (pci-s) N/A 12/30/2012    Procedure: PERCUTANEOUS CORONARY STENT INTERVENTION (PCI-S) - RCA;  Surgeon: Keon Pender M Martinique, MD;  Location: [facility] CATH LAB;  Service: Cardiovascular; 3.0x38, 3.0x32, 3.0x24 Promus Premier DESs   . Percutaneous coronary stent intervention (pci-s) N/A 01/14/2013    PERCUTANEOUS CORONARY STENT INTERVENTION (PCI-S);  Surgeon: Jodiann Ognibene M Martinique, MD;  Location: [facility] CATH LAB;  Service: Cardiovascular;  Staged PCI of LAD w/ 3.0x12 Promus Premier DES.  Marland Kitchen Left and right heart catheterization with coronary angiogram N/A 01/12/2015    LEFT AND RIGHT HEART CATHETERIZATION WITH CORONARY ANGIOGRAM;  Surgeon: Quadre Bristol M Martinique, MD;  Location: [facility] CATH LAB;  Service: Cardiovascular;  Normal Right Heart Pressures. CO/CI (Fick) 5.27 L/min, 2.6 L/min/m2; LAD: mod Ca2+, pLAD 30%, mLAD Stentwidely patent\, m-dLAD ~40%;Cx:2 OMB, terminates as large OM3.  30% small OM1 & m-d Cx;Large, dominant. Prox, mid & distal stents patent ; EF ~35-40%  . Transthoracic echocardiogram  3/15/'216; 4/14/'16    a. 3/'16: EF 45%, basal to mid inferior akinesis and inferolateral to anterolateral hypokinesis. Grade 2 DD. 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 4/'16: EF 55% with basal inferior and mild posterior HK, Mild LVH. Mild-mod MR. Mild LA and RA  dilation   . Inguinal hernia repair Right 2004 ?    w/mesh  . Joint replacement    . Coronary angioplasty  1999  . Coronary angioplasty with stent placement    . Squamous cell carcinoma excision  ~ 1997    "back"  . Esophagogastroduodenoscopy N/A 05/13/2015    Procedure: ESOPHAGOGASTRODUODENOSCOPY (EGD);  Surgeon: Jerene Bears, MD;  Location: [facility] ENDOSCOPY;  Service: Endoscopy;  Laterality: N/A;  . Cardiac catheterization N/A 05/14/2015    Procedure: Left Heart Cath and Coronary Angiography;  Surgeon: Angle Dirusso M Martinique, MD;  Location: [place] CV LAB;  Service: Cardiovascular;  Laterality: N/A;     Medications: Current Outpatient Prescriptions  Medication Sig Dispense Refill  . allopurinol (ZYLOPRIM) 300 MG tablet Take 300 mg by mouth daily.    Marland Kitchen aspirin EC 81 MG tablet Take 81 mg by mouth daily.    . carvedilol (  COREG) 6.25 MG tablet Take 1 tablet (6.25 mg total) by mouth 2 (two) times daily with a meal. 60 tablet 3  . cetirizine (ZYRTEC) 10 MG tablet Take 10 mg by mouth daily as needed for allergies.     Marland Kitchen clopidogrel (PLAVIX) 75 MG tablet Take 75 mg by mouth daily.     . colchicine 0.6 MG tablet Take 0.6 mg by mouth daily as needed (Gout).    Marland Kitchen escitalopram (LEXAPRO) 10 MG tablet Take 10 mg by mouth daily. Take 1-2 tablets in the evening.  3  . fluticasone (FLONASE) 50 MCG/ACT nasal spray Place 2 sprays into the nose daily as needed for allergies.     . furosemide (LASIX) 40 MG tablet Take 1 tablet (40 mg total) by mouth daily.    . isosorbide mononitrate (IMDUR) 30 MG 24 hr tablet Take 1 tablet (30 mg total) by mouth 2 (two) times daily. 60 tablet 3  . KLOR-CON M20 20 MEQ tablet Take 1 tablet (20 mEq total) by mouth daily. 30 tablet 3  . lisinopril (PRINIVIL,ZESTRIL) 40 MG tablet Take 20 mg by mouth every morning.    . Multiple Vitamin (MULTIVITAMIN) tablet Take 1 tablet by mouth daily.    . nitroGLYCERIN (NITROSTAT) 0.4 MG SL tablet Place 1 tablet (0.4 mg total) under the tongue  every 5 (five) minutes x 3 doses as needed for chest pain. 25 tablet 2  . Probiotic Product (PROBIOTIC DAILY PO) Take 1 capsule by mouth daily.    Marland Kitchen tetrahydrozoline 0.05 % ophthalmic solution Place 1 drop into both eyes 2 (two) times daily as needed (Itchy eyes).     No current facility-administered medications for this visit.    Allergies: Allergies  Allergen Reactions  . Crestor [Rosuvastatin]     Muscle aches at Crestor 5 mg daily  . Lipitor [Atorvastatin]     Muscle aches at 20 mg daily   . Pravastatin Sodium     Muscle aches at 20 mg and 40 mg daily  . Zocor [Simvastatin-High Dose]     Muscle aches at 20 mg and 40 mg daily    Social History: The patient  reports that he has been smoking Cigars.  He has never used smokeless tobacco. He reports that he drinks about 3.6 oz of alcohol per week. He reports that he does not use illicit drugs.   Family History: The patient's family history includes Alzheimer's disease in his mother; Arrhythmia in his sister; CVA in his father; Diabetes in his mother; Hypertension in his mother.   Review of Systems: Please see the history of present illness.  He does note that he has been using medical marijuana for years for PTSD and arthritis.  All other systems are reviewed and negative.   Physical Exam: VS:  BP 140/80 mmHg  Pulse 74  Ht 5\' 8"  (1.727 m)  Wt 92.897 kg (204 lb 12.8 oz)  BMI 31.15 kg/m2 .  BMI Body mass index is 31.15 kg/(m^2).   Wt Readings from Last 3 Encounters:  09/16/15 92.897 kg (204 lb 12.8 oz)  06/16/15 89.359 kg (197 lb)  05/16/15 90.2 kg (198 lb 13.7 oz)    General: Pleasant. Well developed, well nourished and in no acute distress.  BP 130/76 supine, 136/80 sitting, 140/80 standing HEENT: Normal. Neck: Supple, no JVD, carotid bruits, or masses noted.  Cardiac: Regular rate and rhythm. No murmur heard on auscultation.  No edema.  Respiratory:  Lungs are clear to auscultation bilaterally with normal  work of  breathing.  GI: Soft and nontender.  MS: No deformity or atrophy. Gait and ROM intact. Skin: Warm and dry. Color is normal.  Neuro:  Strength and sensation are intact and no gross focal deficits noted.  Psych: Alert, appropriate and with normal affect.   LABORATORY DATA:  EKG:  EKG done yesterday shows NSR with right axis. Possible old inferior infarct. No acute change.  Lab Results  Component Value Date   WBC 5.6 05/14/2015   HGB 13.2 05/14/2015   HCT 39.2 05/14/2015   PLT 187 05/14/2015   GLUCOSE 125* 05/15/2015   CHOL 188 01/09/2015   TRIG 199* 01/09/2015   HDL 31* 01/09/2015   LDLCALC 117* 01/09/2015   ALT 47 02/10/2015   AST 37 02/10/2015   NA 140 05/15/2015   K 3.9 05/15/2015   CL 105 05/15/2015   CREATININE 1.12 05/15/2015   BUN 19 05/15/2015   CO2 28 05/15/2015   TSH 1.840 02/10/2015   INR 1.26 05/14/2015   HGBA1C 5.9* 02/10/2015   Labs reviewed from primary care 09/06/15: chemistry and CBC normal. Cholesterol 145, trig-99, LDL 91, HDL 34.  TSH normal.  BNP (last 3 results)  Recent Labs  02/09/15 2335 05/11/15 0227  BNP 770.9* 595.6*    ProBNP (last 3 results)  Recent Labs  02/23/15 1209  PROBNP 246.0*     Other Studies Reviewed Today:  2D Echo 05/22/15 Study Conclusions  - Left ventricle: The cavity size was normal. There was mild concentric hypertrophy. Systolic function was mildly to moderately reduced. The estimated ejection fraction was in the range of 40% to 45%. There is akinesis of the basal-midinferior myocardium. Features are consistent with a pseudonormal left ventricular filling pattern, with concomitant abnormal relaxation and increased filling pressure (grade 2 diastolic dysfunction). - Aortic valve: Trileaflet; normal thickness, mildly calcified leaflets. - Mitral valve: Calcified annulus. Mildly thickened leaflets . There was mild regurgitation directed eccentrically and posteriorly. - Left atrium: The  atrium was severely dilated. - Pulmonic valve: There was trivial regurgitation.  Cardiac cath: 05/14/15: Conclusion     A drug-eluting stent was placed.  Prox RCA lesion, 20% stenosed.  RPDA lesion, 20% stenosed.  1st Mrg lesion, 30% stenosed.  Dist Cx lesion, 30% stenosed.  Ost 2nd Mrg to 2nd Mrg lesion, 30% stenosed.  Prox LAD lesion, 40% stenosed.  A drug-eluting stent was placed.  Dist LAD lesion, 55% stenosed.  1. Nonobstructive CAD. Multiple prior stents are still patent. No change from March 2016. 2. Borderline stenosis in the mid- distal LAD. This lesion is unchanged from prior studies. FFR is 0.82 in an intermediate range. Based on these findings I do not feel this lesion explains his resting chest pain. 3. Normal LV EDP.   Plan: continue medical therapy with beta blocker, nitrates and PPI. Continue long term DAPT. There appears to be an anxiety component to his symptoms- will give lorazepam prn. Consider a SSRI if symptoms persist     Assessment/Plan:  1.  Chronic Diastolic & Systolic CHF: H/o Ischemic cardiomyopathy with prior EF of 45% by echo. Repeat echo July showed EF 40-45%.  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  Euvolemic on physical exam today. Encouraged to stay on low sodium diet. Keep weighing daily. Instructed to take extra Lasix prn weight gain of 2 or more pounds overnight.   2. CAD: admitted with chest pain in July. LHC showed patent stents.  Nonobstructive  CAD.  Continue ASA 81 and Plavix. Does not tolerate statins. Continue current therapy.  3. Mitral regurgitation: Probably ischemic. Moderate.   4. HTN: now well controlled.   5. HLD - statin intolerant. Unable to afford Welchol.  6. Syncope. No clear etiology. He is not orthostatic on exam. Scheduled for carotid dopplers by primary care. Will arrange an event monitor.   Current medicines are reviewed with the patient  today.  The patient does not have concerns regarding medicines other than what has been noted above.  The following changes have been made:  See above.  Labs/ tests ordered today include:    Orders Placed This Encounter  Procedures  . Cardiac event monitor     Disposition:   FU with Dr. Martinique in 2 months.

## 2015-09-16 NOTE — Patient Instructions (Signed)
We will have you wear an event monitor   Continue your current therapy  We will arrange follow up in 2 months.

## 2015-09-19 ENCOUNTER — Other Ambulatory Visit: Payer: Self-pay | Admitting: Physician Assistant

## 2015-09-20 ENCOUNTER — Encounter: Payer: Self-pay | Admitting: *Deleted

## 2015-09-20 ENCOUNTER — Ambulatory Visit (INDEPENDENT_AMBULATORY_CARE_PROVIDER_SITE_OTHER): Payer: Commercial Managed Care - HMO

## 2015-09-20 ENCOUNTER — Ambulatory Visit (INDEPENDENT_AMBULATORY_CARE_PROVIDER_SITE_OTHER): Payer: Commercial Managed Care - HMO | Admitting: Physician Assistant

## 2015-09-20 VITALS — BP 130/82 | HR 74 | Ht 68.0 in | Wt 202.0 lb

## 2015-09-20 DIAGNOSIS — I2510 Atherosclerotic heart disease of native coronary artery without angina pectoris: Secondary | ICD-10-CM

## 2015-09-20 DIAGNOSIS — K7689 Other specified diseases of liver: Secondary | ICD-10-CM

## 2015-09-20 DIAGNOSIS — K6289 Other specified diseases of anus and rectum: Secondary | ICD-10-CM | POA: Diagnosis not present

## 2015-09-20 DIAGNOSIS — I5040 Unspecified combined systolic (congestive) and diastolic (congestive) heart failure: Secondary | ICD-10-CM | POA: Diagnosis not present

## 2015-09-20 DIAGNOSIS — G8929 Other chronic pain: Secondary | ICD-10-CM

## 2015-09-20 DIAGNOSIS — I5042 Chronic combined systolic (congestive) and diastolic (congestive) heart failure: Secondary | ICD-10-CM

## 2015-09-20 DIAGNOSIS — I10 Essential (primary) hypertension: Secondary | ICD-10-CM

## 2015-09-20 DIAGNOSIS — R1031 Right lower quadrant pain: Secondary | ICD-10-CM | POA: Diagnosis not present

## 2015-09-20 DIAGNOSIS — K769 Liver disease, unspecified: Secondary | ICD-10-CM

## 2015-09-20 DIAGNOSIS — R1032 Left lower quadrant pain: Secondary | ICD-10-CM

## 2015-09-20 DIAGNOSIS — I340 Nonrheumatic mitral (valve) insufficiency: Secondary | ICD-10-CM

## 2015-09-20 DIAGNOSIS — Z9861 Coronary angioplasty status: Secondary | ICD-10-CM

## 2015-09-20 DIAGNOSIS — E785 Hyperlipidemia, unspecified: Secondary | ICD-10-CM | POA: Diagnosis not present

## 2015-09-20 DIAGNOSIS — R55 Syncope and collapse: Secondary | ICD-10-CM

## 2015-09-20 MED ORDER — NA SULFATE-K SULFATE-MG SULF 17.5-3.13-1.6 GM/177ML PO SOLN: 1.0000 | Freq: Once | ORAL | Status: AC

## 2015-09-20 NOTE — Patient Instructions (Addendum)
You have been scheduled for a colonoscopy. Please follow written instructions given to you at your visit today.  Please pick up your prep supplies at the pharmacy :  [facility] [address].  If you use inhalers (even only as needed), please bring them with you on the day of your procedure. Your physician has requested that you go to www.startemmi.com and enter the access code given to you at your visit today. This web site gives a general overview about your procedure. However, you should still follow specific instructions given to you by our office regarding your preparation for the procedure.  You have been scheduled for an MRI at  [facility], [place]. on 09-24-2015. Your appointment time is 3:00 PM . Please arrive 15 minutes prior to your appointment time for registration purposes. Please make certain not to have anything to eat or drink 4 hours prior to your test. In addition, if you have any metal in your body, have a pacemaker or defibrillator, please be sure to let your ordering physician know. This test typically takes 45 minutes to 1 hour to complete.

## 2015-09-20 NOTE — Progress Notes (Signed)
Patient ID: Kenneth [name], male   DOB: [birth year], 69 y.o.   MRN: [id number]   Subjective:    Patient ID: Kenneth [name], male    DOB: 08/18/1946, 69 y.o.   MRN: [id number]  HPI  Kenneth [name]  Is a pleasant 69 year old white male known to Dr. Fuller Plan, currently referred by Dr. Lodema Hong for evaluation of complaints of lower abdominal and rectal pain as well as liver lesion.   Asian had last had colonoscopy in February 2015 , at that time had 3 polyps removed 2 were tubular adenomas 1 hyperplastic, also noted to have mild diverticulosis in the sigmoid and small internal hemorrhoids.  He had EGD while hospitalized with chest pain in July 2016 and this showed mouth gastritis and duodenitis biopsies were benign and no evidence of H. Pylori.  Patient has history of coronary artery disease status post several stents the last in 2014 and is maintained on aspirin and Plavix. He is followed by Dr. Martinique. He also has  Congestive heart failure with cardiomyopathy and EF of 40-45% on last echo.   patient states that he has been having his current symptoms over the past month. He says he has a sensation in his groin and testicles particularly on the right that feels as if he has "been kicked in the groin. Sometimes this is worse with walking. He is also having rectal pain brought on by bowel movements. He says he will have some discomfort in his very lower part of his abdomen and rectum and in to his thighs at times. Says this is a shooting-type of pain area and he had one episode over the weekend it was so bad that he felt like he needed to lie down. He says these usually lasts anywhere from 5 minutes to 30 minutes. He has not noticed any melena or hematochezia. No fever or chills. Appetite has been okay weight has been stable. He does not feel like by mouth intake aggravates any of his symptoms. He has had some discomfort across his very low back as well and says that his stools have been held like recently.  Patient also  had a syncopal episode week or so ago and is currently wearing a Holter monitor. He says he had absolutely no lightheadedness or dizziness shortness of breath chest pain etc. Prior to this episode and has not had recurrence.   recent labs have been done and are unremarkable including a heme assure which was negative. He then had CT of the abdomen and pelvis done on 09/13/2015 without IV contrast this showed a 9 x 16 mm nodule along the posterior right diaphragmatic crux and an indeterminate 11 mm hypodense lesion in the lateral left lobe of the liver , and cholelithiasis. Or was relative thickening of the rectal wall rule out underdistention versus mild proctitis and severe degenerative disc disease at the L4-L5 level  patient says he has not been feeling well recently is still working and says sometimes with working or exertion he may have some mild shortness of breath and have to sit down and rest sometimes with his lower abdominal discomfort and also seems to be relieved with resting.  Review of Systems Pertinent positive and negative review of systems were noted in the above HPI section.  All other review of systems was otherwise negative.  Outpatient Encounter Prescriptions as of 09/20/2015  Medication Sig  . allopurinol (ZYLOPRIM) 300 MG tablet Take 300 mg by mouth daily.  Marland Kitchen aspirin EC 81 MG tablet Take  81 mg by mouth daily.  . carvedilol (COREG) 6.25 MG tablet Take 1 tablet (6.25 mg total) by mouth 2 (two) times daily with a meal.  . cetirizine (ZYRTEC) 10 MG tablet Take 10 mg by mouth daily as needed for allergies.   . Cholecalciferol (VITAMIN D3) 2000 UNITS TABS Take by mouth.  . clopidogrel (PLAVIX) 75 MG tablet Take 75 mg by mouth daily.   . colchicine 0.6 MG tablet Take 0.6 mg by mouth daily as needed (Gout).  Marland Kitchen escitalopram (LEXAPRO) 10 MG tablet Take 10 mg by mouth daily. Take 1-2 tablets in the evening.  . fluticasone (FLONASE) 50 MCG/ACT nasal spray Place 2 sprays into the nose daily  as needed for allergies.   . furosemide (LASIX) 40 MG tablet Take 1 tablet (40 mg total) by mouth daily.  . isosorbide mononitrate (IMDUR) 30 MG 24 hr tablet TAKE 1 TABLET (30 MG TOTAL) BY MOUTH 2 (TWO) TIMES DAILY.  Marland Kitchen KLOR-CON M20 20 MEQ tablet Take 1 tablet (20 mEq total) by mouth daily.  Marland Kitchen lisinopril (PRINIVIL,ZESTRIL) 40 MG tablet Take 20 mg by mouth every morning.  . Multiple Vitamin (MULTIVITAMIN) tablet Take 1 tablet by mouth daily.  . nitroGLYCERIN (NITROSTAT) 0.4 MG SL tablet Place 1 tablet (0.4 mg total) under the tongue every 5 (five) minutes x 3 doses as needed for chest pain.  . Probiotic Product (PROBIOTIC DAILY PO) Take 1 capsule by mouth daily.  Marland Kitchen tetrahydrozoline 0.05 % ophthalmic solution Place 1 drop into both eyes 2 (two) times daily as needed (Itchy eyes).  . Na Sulfate-K Sulfate-Mg Sulf SOLN Take 1 kit by mouth once.  . [DISCONTINUED] isosorbide mononitrate (IMDUR) 30 MG 24 hr tablet Take 1 tablet (30 mg total) by mouth 2 (two) times daily.   No facility-administered encounter medications on file as of 09/20/2015.   Allergies  Allergen Reactions  . Crestor [Rosuvastatin]     Muscle aches at Crestor 5 mg daily  . Lipitor [Atorvastatin]     Muscle aches at 20 mg daily   . Pravastatin Sodium     Muscle aches at 20 mg and 40 mg daily  . Zocor [Simvastatin-High Dose]     Muscle aches at 20 mg and 40 mg daily   Patient Active Problem List   Diagnosis Date Noted  . Syncope 09/16/2015  . Gastritis and gastroduodenitis   . Atypical chest pain   . Chest pain with moderate risk of acute coronary syndrome 05/11/2015  . Acute on chronic diastolic congestive heart failure ([place]) 05/11/2015  . Mitral valve insufficiency, ischemic 03/11/2015  . Flash pulmonary edema ([place])   . Acute respiratory failure with hypoxia ([place]) 02/10/2015  . Hypertensive urgency 02/10/2015  . Tobacco abuse 02/10/2015  . Obesity (BMI 30-39.9) 02/10/2015  . Statin intolerance   . Postoperative  anemia due to acute blood loss 06/10/2014  . OA (osteoarthritis) of knee 06/08/2014  . Chronic combined systolic and diastolic CHF (congestive heart failure) ([place]) 02/10/2014  . Acute on chronic combined systolic and diastolic CHF (congestive heart failure) ([place]) 09/18/2013  . Influenza A positive 12/29/2012  . Unspecified sinusitis (chronic) 12/27/2012  . Unstable angina ([place]) 12/26/2012  . CAD S/P percutaneous coronary angioplasty 12/26/2012  . Essential hypertension: Labile   . Hyperlipidemia    Social History   Social History  . Marital Status: Divorced    Spouse Name: N/A  . Number of Children: 3  . Years of Education: N/A   Occupational History  . Custodian  Social History Main Topics  . Smoking status: Light Tobacco Smoker -- 15 years    Types: Cigars  . Smokeless tobacco: Never Used     Comment: rare ciagar on golf days-variable days 1-2 per month  . Alcohol Use: 3.6 oz/week    6 Cans of beer per week  . Drug Use: No  . Sexual Activity: Not on file     Comment: 05/11/2015 "None of your business"   Other Topics Concern  . Not on file   Social History Narrative   Veteran. Receives care through the [facility].     Mr. Kauer family history includes Alzheimer's disease in his mother; Arrhythmia in his sister; CVA in his father; Diabetes in his mother; Hypertension in his mother.      Objective:    Filed Vitals:   09/20/15 1424  BP: 130/82  Pulse: 74    Physical Exam   Well-developed older white male in no acute distress, pleasant blood pressure 130/82 pulse 74 height 5 foot 8 weight 202, HEENT; nontraumatic normocephalic EOMI PERRLA sclera anicteric, cardiovascular regular rate and rhythm with E2-C0 , soft systolic murmur, is currently wearing a cardiac monitor , Pulmonary; clear bilaterally, Abdomen; soft, is mildly tender suprapubically and in the left lower quadrant , no guarding or rebound no palpable mass or hepatosplenomegaly , no bruit heard, Rectal  ;exam not done, extremities no clubbing cyanosis or edema skin warm and dry, Neuropsych; mood and affect appropriate       Assessment & Plan:   #1 69 yo WM with indeterminate 11 mm liver lesion /left lobe on non contrasted CT- r/o malignancy #2 one month hx of lower abdominal discomfort ,groin, also  discomfort and rectal pain post BM's, low back pain- etiology of sxs not clear- possible proctitis on Ct,  sxs atypical but wonder about mesenteric insufficiency with hx of cardiomyopathy, also question pudendal nerve irritation #3 hx of adenomatous colon polyp 2015 #4 diverticulosis #5 CHF/cardiomyopathy- EF 4--45 04/2015 #6 recent syncope- cardiac wu in progress #7 CAD -s/p stents  #8 chronic antiplatelet rx- ASA and Plavix   Plan; Schedule for MR abdomen with angio which will evaluate liver lesion and mesenteric vasculature Will also schedule for Colonoscopy with Dr Fuller Plan- procedure discussed with pt and he is agreeable to proceed. He will need to hold Plavix for 5 days prior to procedure.  We will communicate with his cardiologist Dr Martinique to assure that holding Plavix is reasonable for this pt.  Pt also instructed that is any further cardiac workup is recommended for his syncopal episode that should be completed prior to having Colonoscopy.   Amy S Esterwood PA-C 09/20/2015   Cc: Velna Hatchet, MD

## 2015-09-21 ENCOUNTER — Other Ambulatory Visit: Payer: Self-pay | Admitting: *Deleted

## 2015-09-21 ENCOUNTER — Telehealth: Payer: Self-pay | Admitting: *Deleted

## 2015-09-21 NOTE — Telephone Encounter (Signed)
09/21/2015   RE: BRENNIN HOLLOMAN DOB: Mar 31, 1946 MRN: [id number]   Dear Dr. Peter Martinique,    We have scheduled the above patient for an endoscopic procedure. Our records show that he is on anticoagulation therapy.   Please advise as to how long the patient may come off his therapy of Plavix prior to the procedure, which is scheduled for 11-03-2015.  Please fax back/ or route the completed form to [place] at [phone].   Sincerely,    Amy Esterwood PA-C

## 2015-09-21 NOTE — Telephone Encounter (Signed)
He may stop Plavix for 7 days for endoscopic procedure. Thanks  Latoya Diskin Martinique MD, [facility]

## 2015-09-21 NOTE — Progress Notes (Signed)
Reviewed and agree with management plan.  Malcolm T. Stark, MD FACG 

## 2015-09-21 NOTE — Telephone Encounter (Signed)
LM for the patient to advise he can hold the Plavix for 5 days prior to the colonoscopy.  We got a response from Dr. Martinique for 7 days but Nicoletta Ba PA said 5 days prior to the colonoscopy date was fine. I advised the patient that he can resume the Plavix the day after the colonoscopy unless told otherwise.

## 2015-09-24 ENCOUNTER — Ambulatory Visit (HOSPITAL_COMMUNITY)
Admission: RE | Admit: 2015-09-24 | Discharge: 2015-09-24 | Disposition: A | Discharge status: Home / Self Care | Payer: Commercial Managed Care - HMO | Source: Ambulatory Visit | Attending: Physician Assistant | Admitting: Physician Assistant

## 2015-09-24 DIAGNOSIS — G8929 Other chronic pain: Secondary | ICD-10-CM | POA: Insufficient documentation

## 2015-09-24 DIAGNOSIS — R1031 Right lower quadrant pain: Secondary | ICD-10-CM | POA: Diagnosis not present

## 2015-09-24 DIAGNOSIS — K6289 Other specified diseases of anus and rectum: Secondary | ICD-10-CM | POA: Diagnosis present

## 2015-09-24 DIAGNOSIS — K8020 Calculus of gallbladder without cholecystitis without obstruction: Secondary | ICD-10-CM | POA: Insufficient documentation

## 2015-09-24 DIAGNOSIS — R1032 Left lower quadrant pain: Secondary | ICD-10-CM | POA: Diagnosis present

## 2015-09-24 DIAGNOSIS — K769 Liver disease, unspecified: Secondary | ICD-10-CM | POA: Insufficient documentation

## 2015-09-24 DIAGNOSIS — K7689 Other specified diseases of liver: Secondary | ICD-10-CM | POA: Diagnosis present

## 2015-09-24 DIAGNOSIS — K551 Chronic vascular disorders of intestine: Secondary | ICD-10-CM | POA: Insufficient documentation

## 2015-09-24 LAB — POCT I-STAT CREATININE: CREATININE: 1.1 mg/dL (ref 0.61–1.24)

## 2015-09-24 MED ORDER — GADOBENATE DIMEGLUMINE 529 MG/ML IV SOLN
20.0000 mL | Freq: Once | INTRAVENOUS | Status: AC | PRN
  Administered 2015-09-24: 18 mL via INTRAVENOUS

## 2015-09-27 ENCOUNTER — Encounter: Payer: Self-pay | Admitting: Cardiology

## 2015-09-28 ENCOUNTER — Telehealth: Payer: Self-pay | Admitting: Physician Assistant

## 2015-09-28 NOTE — Telephone Encounter (Signed)
Patient advised his results are in but have not been reviewed by the provider. 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 The celiac and superior mesenteric artery are both widely patent, and there is no bowel abnormality to suggest active Ischemia."

## 2015-09-29 ENCOUNTER — Telehealth: Payer: Self-pay | Admitting: *Deleted

## 2015-09-29 ENCOUNTER — Telehealth: Payer: Self-pay | Admitting: Gastroenterology

## 2015-09-29 NOTE — Telephone Encounter (Signed)
Left second message regarding his colonscopy with Dr.Stark on 11-03-2015.  Per Dr. Peter Martinique, he can stop his plavix on 10-30-2015 and resume it on 11-04-2015.  Lm he can call us if he has any questions at [phone].

## 2015-09-30 NOTE — Telephone Encounter (Signed)
Kenneth [name], would you look over his report and advise? He says he still has abdominal discomfort.

## 2015-09-30 NOTE — Telephone Encounter (Signed)
Please get him appt to see AE in office. Per AEs note he has had groin, testicular pain that are very likely not GI related so he should see his PCP or Urologist too.

## 2015-09-30 NOTE — Telephone Encounter (Signed)
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 causing lower abdominal sxs..he is scheduled for colonoscopy with you- call cause still uncomfortable- would you send to vascular surg?  Beth-please let pt know Dr Fuller Plan  reviewing MRI- the liver lesion on CT is a simple cyst  So no problem there.he does have some narrowing of one of the large blood vessels supplying intestine , may need to see a vascular surgeon- .Marland Kitchen Would also like to see what colonoscopy shows.Marland KitchenMarland Kitchen

## 2015-09-30 NOTE — Telephone Encounter (Signed)
Yes.ibuprofen can see but needs to see urology soon for c/o groin,testicular pain- please get him an appt soon  With uruolgy-thanks

## 2015-09-30 NOTE — Telephone Encounter (Signed)
Spoke with the patient. More frequent occurrences of the pain and the level of intensity is increased. He is doubling over with the pain. it puts him out of commission for hours. It makes him feel the urge to defecate.  Overall, he feels he is doing worse. Please advise on what to do or call the patient.

## 2015-09-30 NOTE — Telephone Encounter (Signed)
Patient calling back regarding this. He is requesting callback as soon as possible. Best. [phone]

## 2015-09-30 NOTE — Telephone Encounter (Signed)
Spoke with the patient. He prefers to go back to PCP. I contacted the PCP. They state the patient had already contacted him and they would be calling him soon. Office note and MRI faxed to [facility] to the attention of DJ, Therapist, sports

## 2015-10-04 NOTE — Telephone Encounter (Signed)
Ok, that's fine.

## 2015-10-07 ENCOUNTER — Ambulatory Visit: Payer: Commercial Managed Care - HMO | Admitting: Cardiology

## 2015-10-11 ENCOUNTER — Other Ambulatory Visit: Payer: Self-pay | Admitting: Internal Medicine

## 2015-10-11 ENCOUNTER — Telehealth: Payer: Self-pay

## 2015-10-11 DIAGNOSIS — R1030 Lower abdominal pain, unspecified: Secondary | ICD-10-CM

## 2015-10-11 DIAGNOSIS — N5082 Scrotal pain: Secondary | ICD-10-CM

## 2015-10-11 NOTE — Telephone Encounter (Signed)
Received a call from patient he stated he received a letter from insurance denying payment for monitor.Spoke to Redby in monitors at [employer].She advised the patient to call [employer].Stated he has a covered diagnosis.

## 2015-10-12 ENCOUNTER — Telehealth: Payer: Self-pay | Admitting: Cardiology

## 2015-10-12 ENCOUNTER — Encounter (HOSPITAL_COMMUNITY): Payer: Self-pay | Admitting: Neurology

## 2015-10-12 ENCOUNTER — Emergency Department (HOSPITAL_COMMUNITY)
Admission: EM | Admit: 2015-10-12 | Discharge: 2015-10-12 | Disposition: A | Discharge status: Home / Self Care | Payer: Commercial Managed Care - HMO | Attending: Emergency Medicine | Admitting: Emergency Medicine

## 2015-10-12 DIAGNOSIS — I10 Essential (primary) hypertension: Secondary | ICD-10-CM | POA: Diagnosis not present

## 2015-10-12 DIAGNOSIS — I2510 Atherosclerotic heart disease of native coronary artery without angina pectoris: Secondary | ICD-10-CM | POA: Diagnosis not present

## 2015-10-12 DIAGNOSIS — L03113 Cellulitis of right upper limb: Secondary | ICD-10-CM | POA: Diagnosis not present

## 2015-10-12 DIAGNOSIS — Z7982 Long term (current) use of aspirin: Secondary | ICD-10-CM | POA: Diagnosis not present

## 2015-10-12 DIAGNOSIS — L02413 Cutaneous abscess of right upper limb: Secondary | ICD-10-CM | POA: Diagnosis not present

## 2015-10-12 DIAGNOSIS — E119 Type 2 diabetes mellitus without complications: Secondary | ICD-10-CM | POA: Insufficient documentation

## 2015-10-12 DIAGNOSIS — L0291 Cutaneous abscess, unspecified: Secondary | ICD-10-CM

## 2015-10-12 DIAGNOSIS — I252 Old myocardial infarction: Secondary | ICD-10-CM | POA: Insufficient documentation

## 2015-10-12 DIAGNOSIS — F17200 Nicotine dependence, unspecified, uncomplicated: Secondary | ICD-10-CM | POA: Insufficient documentation

## 2015-10-12 DIAGNOSIS — Z86010 Personal history of colonic polyps: Secondary | ICD-10-CM | POA: Insufficient documentation

## 2015-10-12 DIAGNOSIS — Z79899 Other long term (current) drug therapy: Secondary | ICD-10-CM | POA: Insufficient documentation

## 2015-10-12 DIAGNOSIS — I5032 Chronic diastolic (congestive) heart failure: Secondary | ICD-10-CM | POA: Insufficient documentation

## 2015-10-12 DIAGNOSIS — F329 Major depressive disorder, single episode, unspecified: Secondary | ICD-10-CM | POA: Insufficient documentation

## 2015-10-12 DIAGNOSIS — L0390 Cellulitis, unspecified: Secondary | ICD-10-CM

## 2015-10-12 LAB — BASIC METABOLIC PANEL
ANION GAP: 9 (ref 5–15)
BUN: 22 mg/dL — ABNORMAL HIGH (ref 6–20)
CO2: 24 mmol/L (ref 22–32)
Calcium: 9 mg/dL (ref 8.9–10.3)
Chloride: 101 mmol/L (ref 101–111)
Creatinine, Ser: 1.07 mg/dL (ref 0.61–1.24)
GLUCOSE: 146 mg/dL — AB (ref 65–99)
POTASSIUM: 3.8 mmol/L (ref 3.5–5.1)
Sodium: 134 mmol/L — ABNORMAL LOW (ref 135–145)

## 2015-10-12 LAB — CBC WITH DIFFERENTIAL/PLATELET
BASOS ABS: 0 10*3/uL (ref 0.0–0.1)
Basophils Relative: 0 %
EOS PCT: 1 %
Eosinophils Absolute: 0.1 10*3/uL (ref 0.0–0.7)
HEMATOCRIT: 40.5 % (ref 39.0–52.0)
Hemoglobin: 13.8 g/dL (ref 13.0–17.0)
LYMPHS PCT: 17 %
Lymphs Abs: 1.4 10*3/uL (ref 0.7–4.0)
MCH: 31.9 pg (ref 26.0–34.0)
MCHC: 34.1 g/dL (ref 30.0–36.0)
MCV: 93.5 fL (ref 78.0–100.0)
Monocytes Absolute: 0.6 10*3/uL (ref 0.1–1.0)
Monocytes Relative: 7 %
NEUTROS ABS: 6.1 10*3/uL (ref 1.7–7.7)
Neutrophils Relative %: 75 %
PLATELETS: 188 10*3/uL (ref 150–400)
RBC: 4.33 MIL/uL (ref 4.22–5.81)
RDW: 13.6 % (ref 11.5–15.5)
WBC: 8.2 10*3/uL (ref 4.0–10.5)

## 2015-10-12 MED ORDER — LIDOCAINE-EPINEPHRINE (PF) 2 %-1:200000 IJ SOLN
10.0000 mL | Freq: Once | INTRAMUSCULAR | Status: AC
  Administered 2015-10-12: 10 mL
  Filled 2015-10-12: qty 20

## 2015-10-12 NOTE — ED Provider Notes (Signed)
CSN: [id number]     Arrival date & time 10/12/15  1032 History   First MD Initiated Contact with Patient 10/12/15 1329     Chief Complaint  Patient presents with  . Insect Bite     (Consider location/radiation/quality/duration/timing/severity/associated sxs/prior Treatment) HPI Kenneth [name] is a 69 y.o. male with a history of CHF, hypertension, previous diagnosis of diabetes, comes in for evaluation of insect bite. Patient reports he thinks he was bitten by a spider on Thursday night while sleeping and woke up on Friday morning with a sore spot on the middle of his right forearm. He saw his primary care doctor 2 days ago and was started on doxycycline for cellulitis, but the area has since become more red, swollen and tender. He was told by his PCP to come to the ED for admission for IV antibiotics. Patient reports chills at home for the past 2 nights as well as night sweats over the weekend. No other symptoms. No other modifying factors.  Past Medical History  Diagnosis Date  . Hypertension   . Hyperlipidemia     a. Statin intolerant, on fibrate.  . Tobacco abuse   . CAD S/P percutaneous coronary angioplasty 1999; 2014    a. 1999: s/p PTCA;  b. 11/2012 Cath/PCI: s/p DES to RCA, EF 40% inf HK;  c. 12/2012 Staged PCI of LAD w/ 3.0x12 Promus Premier DES. d. Cath 3/16: patent stents, EF 35-40%. e. 04/2015: cath stable - patent stents, nonobstructive CAD, borderline stenosis in the mid-distal LAD with FFR 0.82 (intermediate).  . Chronic diastolic CHF (congestive heart failure) ([place]) 09/18/2013  . Squamous cell carcinoma (HCC)     Back  . Myocardial infarction [facility])     '99- MI,"angioplasty", Stents 2& 3'14  . H/O seasonal allergies   . Elevated uric acid in blood   . Type II diabetes mellitus ([place])     "A1C fine since 2012" (05/11/2015)  . Osteoarthritis     "-knees, hands, wrist, fingers" (05/11/2015)  . History of gout   . Depression 1990's    "stopped RX after having suicidal thoughts"  (05/11/2015)  . PTSD (post-traumatic stress disorder)     "[name]"  . LV dysfunction     a. Varying EF from 35-40%, 40-45% on prior studies.  . Colon polyps 12/02/2013    Tubular adenoma-2, Hyperplastic polyps-3   Past Surgical History  Procedure Laterality Date  . Carpal tunnel release Left 2000's  . Ankle reconstruction Right 1960    "had to break then rebuild"  . Total knee arthroplasty Right 06/08/2014    Procedure: RIGHT TOTAL KNEE ARTHROPLASTY;  Surgeon: Gearlean Alf, MD;  Location: [facility] ORS;  Service: Orthopedics;  Laterality: Right;  . Left heart catheterization with coronary angiogram N/A 12/27/2012    Procedure: LEFT HEART CATHETERIZATION WITH CORONARY ANGIOGRAM;  Surgeon: Jolaine Artist, MD;  Location: [facility] CATH LAB;  Service: Cardiovascular;  LM mild plaque, LAD 60-54m LCX 30p, 486mOM1 4049mCA 30-40ost, 40-15m44m-73m/55m70-80d, EF 40%  . Percutaneous coronary stent intervention (pci-s) N/A 12/30/2012    Procedure: PERCUTANEOUS CORONARY STENT INTERVENTION (PCI-S) - RCA;  Surgeon: Peter M JordaMartinique  [facility];  Service: Cardiovascular; 3.0x38, 3.0x32, 3.0x24 Promus Premier DESs   . Percutaneous coronary stent intervention (pci-s) N/A 01/14/2013    PERCUTANEOUS CORONARY STENT INTERVENTION (PCI-S);  Surgeon: Peter M JordaMartinique  [facility];  Service: Cardiovascular;  Staged PCI of LAD w/ 3.0x12 Promus Premier  DES.  . Left and right heart catheterization with coronary angiogram N/A 01/12/2015    LEFT AND RIGHT HEART CATHETERIZATION WITH CORONARY ANGIOGRAM;  Surgeon: Peter M Martinique, MD;  Location: [facility] CATH LAB;  Service: Cardiovascular;  Normal Right Heart Pressures. CO/CI (Fick) 5.27 L/min, 2.6 L/min/m2; LAD: mod Ca2+, pLAD 30%, mLAD Stentwidely patent\, m-dLAD ~40%;Cx:2 OMB, terminates as large OM3.  30% small OM1 & m-d Cx;Large, dominant. Prox, mid & distal stents patent ; EF ~35-40%  . Transthoracic echocardiogram  3/15/'216; 4/14/'16    a. 3/'16: EF  45%, basal to mid inferior akinesis and inferolateral to anterolateral hypokinesis. Grade 2 DD. 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 4/'16: EF 55% with basal inferior and mild posterior HK, Mild LVH. Mild-mod MR. Mild LA and RA dilation   . Inguinal hernia repair Right 2004 ?    w/mesh  . Joint replacement    . Coronary angioplasty  1999  . Coronary angioplasty with stent placement    . Squamous cell carcinoma excision  ~ 1997    "back"  . Esophagogastroduodenoscopy N/A 05/13/2015    Procedure: ESOPHAGOGASTRODUODENOSCOPY (EGD);  Surgeon: Jerene Bears, MD;  Location: [facility] ENDOSCOPY;  Service: Endoscopy;  Laterality: N/A;  . Cardiac catheterization N/A 05/14/2015    Procedure: Left Heart Cath and Coronary Angiography;  Surgeon: Peter M Martinique, MD;  Location: [place] [facility] LAB;  Service: Cardiovascular;  Laterality: N/A;   Family History  Problem Relation Age of Onset  . Arrhythmia Sister     s/p PPM  . Alzheimer's disease Mother   . Hypertension Mother   . Diabetes Mother   . CVA Father    Social History  Substance Use Topics  . Smoking status: Light Tobacco Smoker -- 15 years    Types: Cigars  . Smokeless tobacco: Never Used     Comment: rare ciagar on golf days-variable days 1-2 per month  . Alcohol Use: 3.6 oz/week    6 Cans of beer per week    Review of Systems A 10 point review of systems was completed and was negative except for pertinent positives and negatives as mentioned in the history of present illness     Allergies  Crestor; Lipitor; Pravastatin sodium; and Zocor  Home Medications   Prior to Admission medications   Medication Sig Start Date End Date Taking? Authorizing Provider  allopurinol (ZYLOPRIM) 300 MG tablet Take 300 mg by mouth daily.   Yes Historical Provider, MD  aspirin EC 81 MG tablet Take 81 mg by mouth daily.   Yes Historical Provider, MD  carvedilol (COREG) 6.25 MG tablet Take 1 tablet (6.25 mg total) by mouth 2 (two)  times daily with a meal. 05/16/15  Yes Dayna N Dunn, PA-C  cetirizine (ZYRTEC) 10 MG tablet Take 10 mg by mouth daily as needed for allergies.    Yes Historical Provider, MD  Cholecalciferol (VITAMIN D3) 2000 UNITS TABS Take by mouth.   Yes Historical Provider, MD  clopidogrel (PLAVIX) 75 MG tablet Take 75 mg by mouth daily.  07/04/14  Yes Historical Provider, MD  colchicine 0.6 MG tablet Take 0.6 mg by mouth daily as needed (Gout).   Yes Historical Provider, MD  doxycycline (MONODOX) 100 MG capsule Take 100 mg by mouth 2 (two) times daily. For 10 days started on 10-11-15 on day 2 of therapy   Yes Historical Provider, MD  escitalopram (LEXAPRO) 10 MG tablet Take 10 mg by mouth daily. Take 1-2 tablets in the evening.  05/19/15  Yes Historical Provider, MD  fluticasone (FLONASE) 50 MCG/ACT nasal spray Place 2 sprays into the nose daily as needed for allergies.  12/31/12  Yes Ripudeep Krystal Eaton, MD  furosemide (LASIX) 40 MG tablet Take 1 tablet (40 mg total) by mouth daily. 05/16/15  Yes Dayna N Dunn, PA-C  isosorbide mononitrate (IMDUR) 30 MG 24 hr tablet TAKE 1 TABLET (30 MG TOTAL) BY MOUTH 2 (TWO) TIMES DAILY. 09/20/15  Yes Dayna N Dunn, PA-C  KLOR-CON M20 20 MEQ tablet Take 1 tablet (20 mEq total) by mouth daily. 07/23/15  Yes Peter M Martinique, MD  lisinopril (PRINIVIL,ZESTRIL) 40 MG tablet Take 20 mg by mouth every morning.   Yes Historical Provider, MD  Multiple Vitamin (MULTIVITAMIN) tablet Take 1 tablet by mouth daily.   Yes Historical Provider, MD  Probiotic Product (PROBIOTIC DAILY PO) Take 1 capsule by mouth daily.   Yes Historical Provider, MD  tetrahydrozoline 0.05 % ophthalmic solution Place 1 drop into both eyes 2 (two) times daily as needed (Itchy eyes).   Yes Historical Provider, MD  Na Sulfate-K Sulfate-Mg Sulf SOLN Take 1 kit by mouth once. 09/20/15 10/20/15  Amy S Esterwood, PA-C  nitroGLYCERIN (NITROSTAT) 0.4 MG SL tablet Place 1 tablet (0.4 mg total) under the tongue every 5 (five) minutes x 3  doses as needed for chest pain. 01/10/15   Luke K Kilroy, PA-C   BP 145/83 mmHg  Pulse 62  Temp(Src) 98.7 F (37.1 C) (Oral)  Resp 17  SpO2 99% Physical Exam  Constitutional: He is oriented to person, place, and time. He appears well-developed and well-nourished.  HENT:  Head: Normocephalic and atraumatic.  Mouth/Throat: Oropharynx is clear and moist.  Eyes: Conjunctivae are normal. Pupils are equal, round, and reactive to light. Right eye exhibits no discharge. Left eye exhibits no discharge. No scleral icterus.  Neck: Neck supple.  Cardiovascular: Normal rate, regular rhythm and normal heart sounds.   Pulmonary/Chest: Effort normal and breath sounds normal. No respiratory distress. He has no wheezes. He has no rales.  Abdominal: Soft. There is no tenderness.  Musculoskeletal: He exhibits no tenderness.  Neurological: He is alert and oriented to person, place, and time.  Cranial Nerves II-XII grossly intact  Skin: Skin is warm and dry. No rash noted.  Middle of right forearm with area of erythema, induration and essentially raised with some drainage consistent with cellulitis. See picture  Psychiatric: He has a normal mood and affect.  Nursing note and vitals reviewed.     ED Course  Procedures (including critical care time)  EMERGENCY DEPARTMENT US SOFT TISSUE INTERPRETATION "Study: Limited Ultrasound of the noted body part in comments below"  INDICATIONS: Soft tissue infection Multiple views of the body part are obtained with a multi-frequency linear probe  PERFORMED BY:  Myself  IMAGES ARCHIVED?: Yes  SIDE:Right   BODY PART:Upper extremity  FINDINGS: Abcess present and Cellulitis present  LIMITATIONS:  Body Habitus  INTERPRETATION:  Abcess present and Cellulitis present  COMMENT:  Significant cellulitis with small abscess.  INCISION AND DRAINAGE Performed by: Verl Dicker Consent: Verbal consent obtained. Risks and benefits: risks, benefits and  alternatives were discussed Type: abscess  Body area: Right forearm  Anesthesia: local infiltration  Incision was made with a scalpel.  Local anesthetic: lidocaine 2 % without epinephrine  Anesthetic total: 3 ml  Complexity: complex Blunt dissection to break up loculations  Drainage: purulent  Drainage amount: Small   Packing material: 1/4 in iodoform gauze  Patient  tolerance: Patient tolerated the procedure well with no immediate complications.      Labs Review Labs Reviewed  BASIC METABOLIC PANEL - Abnormal; Notable for the following:    Sodium 134 (*)    Glucose, Bld 146 (*)    BUN 22 (*)    All other components within normal limits  CBC WITH DIFFERENTIAL/PLATELET    Imaging Review No results found. I have personally reviewed and evaluated these images and lab results as part of my medical decision-making.   EKG Interpretation None     Meds given in ED:  Medications  lidocaine-EPINEPHrine (XYLOCAINE W/EPI) 2 %-1:200000 (PF) injection 10 mL (not administered)    New Prescriptions   No medications on file   Filed Vitals:   10/12/15 1049 10/12/15 1435 10/12/15 1604  BP: 150/85 146/73 145/83  Pulse: 65 63 62  Temp: 98.1 F (36.7 C)  98.7 F (37.1 C)  TempSrc: Oral  Oral  Resp: 20 16 17   SpO2: 98% 98% 99%    MDM  KEATYN JAWAD is a 69 y.o. male with history of CHF, on Plavix who comes in for evaluation of cellulitis of his right forearm. On arrival, patient is hemodynamically stable and afebrile. On Exam He does have a localized area of cellulitis to his right forearm, ultrasound shows some evidence of abscess. I and D performed with some pus expelled. No evidence of other systemic infection. Labs are reassuring, white blood cell count 8.2. Labs otherwise unremarkable.  Encouraged patient to continue with outpatient doxycycline regimen and follow up with his PCP in 2-3 days for wound recheck. Strict return precautions given. Patient verbalizes  understanding and agrees with this plan. Prior to patient discharge, I discussed and reviewed this case with Dr. Ralene Bathe, who also saw and evaluated the patient and agrees with this plan. The patient appears reasonably screened and/or stabilized for discharge and I doubt any other medical condition or other [facility] requiring further screening, evaluation, or treatment in the ED at this time prior to discharge.   Final diagnoses:  Cellulitis and abscess       Comer Locket, PA-C 10/12/15 1613  Quintella Reichert, MD 10/13/15 [phone]

## 2015-10-12 NOTE — ED Notes (Signed)
EDP at bedside  

## 2015-10-12 NOTE — ED Notes (Signed)
Pt thinks he was bitten by a spider Thursday night while sleeping, when he woke up Friday morning had small raised area to right forearm with a white head. Over the weekend it got bigger. Went to PCP yesterday given doxycycline. This morning the area opened up and is draining. PCP told to come here for antibiotics IV. Has red area to right forearm 2 x 2. Scabbed area to center and is oozing pink/clear drainage.

## 2015-10-12 NOTE — Telephone Encounter (Signed)
Spoke to patient.Shelly in monitors spoke to [place] with [employer].He stated patient will not have to pay for monitor.Stated appeal will be filed and cost will be taken care of.Advised to call back if he receives a bill.

## 2015-10-12 NOTE — Telephone Encounter (Signed)
Received a call from patient.He stated he called [employer].Stated monitor was approved by his insurance but [employer] not in network.Stated he will be responsible for the bill.Stated he needs to know why monitor was put on if not approved.  Left message for Shelly in monitors to call me back.

## 2015-10-12 NOTE — Discharge Instructions (Signed)
You were evaluated in the ED today for your right arm wound. This is a form of cellulitis, a skin infection. Your wound was drained and your antibiotics should work better now. Please follow-up with your doctor in the next 3 days for a wound recheck. Keep your wound clean and dry. Return to the ED for any new or worsening symptoms including fevers, nausea or vomiting, worsening redness.  Abscess An abscess is an infected area that contains a collection of pus and debris.It can occur in almost any part of the body. An abscess is also known as a furuncle or boil. CAUSES  An abscess occurs when tissue gets infected. This can occur from blockage of oil or sweat glands, infection of hair follicles, or a minor injury to the skin. As the body tries to fight the infection, pus collects in the area and creates pressure under the skin. This pressure causes pain. People with weakened immune systems have difficulty fighting infections and get certain abscesses more often.  SYMPTOMS Usually an abscess develops on the skin and becomes a painful mass that is red, warm, and tender. If the abscess forms under the skin, you may feel a moveable soft area under the skin. Some abscesses break open (rupture) on their own, but most will continue to get worse without care. The infection can spread deeper into the body and eventually into the bloodstream, causing you to feel ill.  DIAGNOSIS  Your caregiver will take your medical history and perform a physical exam. A sample of fluid may also be taken from the abscess to determine what is causing your infection. TREATMENT  Your caregiver may prescribe antibiotic medicines to fight the infection. However, taking antibiotics alone usually does not cure an abscess. Your caregiver may need to make a small cut (incision) in the abscess to drain the pus. In some cases, gauze is packed into the abscess to reduce pain and to continue draining the area. HOME CARE INSTRUCTIONS   Only  take over-the-counter or prescription medicines for pain, discomfort, or fever as directed by your caregiver.  If you were prescribed antibiotics, take them as directed. Finish them even if you start to feel better.  If gauze is used, follow your caregiver's directions for changing the gauze.  To avoid spreading the infection:  Keep your draining abscess covered with a bandage.  Wash your hands well.  Do not share personal care items, towels, or whirlpools with others.  Avoid skin contact with others.  Keep your skin and clothes clean around the abscess.  Keep all follow-up appointments as directed by your caregiver. SEEK MEDICAL CARE IF:   You have increased pain, swelling, redness, fluid drainage, or bleeding.  You have muscle aches, chills, or a general ill feeling.  You have a fever. MAKE SURE YOU:   Understand these instructions.  Will watch your condition.  Will get help right away if you are not doing well or get worse.   This information is not intended to replace advice given to you by your health care provider. Make sure you discuss any questions you have with your health care provider.   Document Released: 07/26/2005 Document Revised: 04/16/2012 Document Reviewed: 12/29/2011 Elsevier Interactive Patient Education 2016 Elsevier Inc.  Cellulitis Cellulitis is an infection of the skin and the tissue beneath it. The infected area is usually red and tender. Cellulitis occurs most often in the arms and lower legs.  CAUSES  Cellulitis is caused by bacteria that enter the skin through  cracks or cuts in the skin. The most common types of bacteria that cause cellulitis are staphylococci and streptococci. SIGNS AND SYMPTOMS   Redness and warmth.  Swelling.  Tenderness or pain.  Fever. DIAGNOSIS  Your health care provider can usually determine what is wrong based on a physical exam. Blood tests may also be done. TREATMENT  Treatment usually involves taking an  antibiotic medicine. HOME CARE INSTRUCTIONS   Take your antibiotic medicine as directed by your health care provider. Finish the antibiotic even if you start to feel better.  Keep the infected arm or leg elevated to reduce swelling.  Apply a warm cloth to the affected area up to 4 times per day to relieve pain.  Take medicines only as directed by your health care provider.  Keep all follow-up visits as directed by your health care provider. SEEK MEDICAL CARE IF:   You notice red streaks coming from the infected area.  Your red area gets larger or turns dark in color.  Your bone or joint underneath the infected area becomes painful after the skin has healed.  Your infection returns in the same area or another area.  You notice a swollen bump in the infected area.  You develop new symptoms.  You have a fever. SEEK IMMEDIATE MEDICAL CARE IF:   You feel very sleepy.  You develop vomiting or diarrhea.  You have a general ill feeling (malaise) with muscle aches and pains.   This information is not intended to replace advice given to you by your health care provider. Make sure you discuss any questions you have with your health care provider.   Document Released: 07/26/2005 Document Revised: 07/07/2015 Document Reviewed: 01/01/2012 Elsevier Interactive Patient Education [employer].

## 2015-10-18 ENCOUNTER — Other Ambulatory Visit: Payer: Commercial Managed Care - HMO

## 2015-10-22 ENCOUNTER — Telehealth: Payer: Self-pay | Admitting: *Deleted

## 2015-10-22 NOTE — Telephone Encounter (Signed)
-----   Message from Peter M Martinique, MD sent at 10/22/2015 11:55 AM EST ----- Event monitor looks OK. 4 beat run of NSVT, otherwise normal. Will monitor for recurrent symptoms at this point.  Peter Martinique MD, [employer]

## 2015-10-22 NOTE — Telephone Encounter (Signed)
Spoke to patient.  Monitor Result given . Verbalized understanding  

## 2015-10-26 ENCOUNTER — Other Ambulatory Visit: Payer: Self-pay

## 2015-10-26 MED ORDER — CARVEDILOL 6.25 MG PO TABS: 6.2500 mg | ORAL_TABLET | Freq: Two times a day (BID) | ORAL | Status: DC

## 2015-11-02 ENCOUNTER — Other Ambulatory Visit: Payer: Self-pay | Admitting: Gastroenterology

## 2015-11-02 DIAGNOSIS — R1084 Generalized abdominal pain: Secondary | ICD-10-CM

## 2015-11-02 DIAGNOSIS — K6289 Other specified diseases of anus and rectum: Secondary | ICD-10-CM

## 2015-11-03 ENCOUNTER — Ambulatory Visit (AMBULATORY_SURGERY_CENTER): Payer: PPO | Admitting: Gastroenterology

## 2015-11-03 ENCOUNTER — Encounter: Payer: Self-pay | Admitting: Gastroenterology

## 2015-11-03 VITALS — BP 140/77 | HR 64 | Temp 97.6°F | Resp 22 | Ht 68.0 in | Wt 202.0 lb

## 2015-11-03 DIAGNOSIS — R1030 Lower abdominal pain, unspecified: Secondary | ICD-10-CM | POA: Diagnosis not present

## 2015-11-03 DIAGNOSIS — I10 Essential (primary) hypertension: Secondary | ICD-10-CM | POA: Diagnosis not present

## 2015-11-03 DIAGNOSIS — R1032 Left lower quadrant pain: Secondary | ICD-10-CM | POA: Diagnosis not present

## 2015-11-03 DIAGNOSIS — D125 Benign neoplasm of sigmoid colon: Secondary | ICD-10-CM

## 2015-11-03 DIAGNOSIS — R1031 Right lower quadrant pain: Secondary | ICD-10-CM | POA: Diagnosis not present

## 2015-11-03 DIAGNOSIS — D124 Benign neoplasm of descending colon: Secondary | ICD-10-CM | POA: Diagnosis not present

## 2015-11-03 DIAGNOSIS — D123 Benign neoplasm of transverse colon: Secondary | ICD-10-CM | POA: Diagnosis not present

## 2015-11-03 DIAGNOSIS — I509 Heart failure, unspecified: Secondary | ICD-10-CM | POA: Diagnosis not present

## 2015-11-03 DIAGNOSIS — R194 Change in bowel habit: Secondary | ICD-10-CM | POA: Diagnosis not present

## 2015-11-03 DIAGNOSIS — K635 Polyp of colon: Secondary | ICD-10-CM | POA: Diagnosis not present

## 2015-11-03 DIAGNOSIS — G35 Multiple sclerosis: Secondary | ICD-10-CM | POA: Diagnosis not present

## 2015-11-03 DIAGNOSIS — F329 Major depressive disorder, single episode, unspecified: Secondary | ICD-10-CM | POA: Diagnosis not present

## 2015-11-03 DIAGNOSIS — I2510 Atherosclerotic heart disease of native coronary artery without angina pectoris: Secondary | ICD-10-CM | POA: Diagnosis not present

## 2015-11-03 MED ORDER — SODIUM CHLORIDE 0.9 % IV SOLN: 500.0000 mL | INTRAVENOUS | Status: DC

## 2015-11-03 MED ORDER — GLYCOPYRROLATE 2 MG PO TABS: 2.0000 mg | ORAL_TABLET | Freq: Two times a day (BID) | ORAL | Status: DC

## 2015-11-03 NOTE — Progress Notes (Signed)
Report to PACU, RN, vss, BBS= Clear.  

## 2015-11-03 NOTE — Progress Notes (Signed)
Written rx was given to Kenneth [name] for robinul 2mg .  Kenneth [name] is HIPPA.  Envelope with discharge instructions given to Kenneth [name].  Ernestine Conrad, RN went over discharge instructions with Kenneth [name].  maw

## 2015-11-03 NOTE — Progress Notes (Signed)
Called to room to assist during endoscopic procedure.  Patient ID and intended procedure confirmed with present staff. Received instructions for my participation in the procedure from the performing physician.  

## 2015-11-03 NOTE — Progress Notes (Signed)
No problems noted in the recovery room. maw 

## 2015-11-03 NOTE — Patient Instructions (Addendum)
YOU HAD AN ENDOSCOPIC PROCEDURE TODAY AT [facility]:   Refer to the procedure report that was given to you for any specific questions about what was found during the examination.  If the procedure report does not answer your questions, please call your gastroenterologist to clarify.  If you requested that your care partner not be given the details of your procedure findings, then the procedure report has been included in a sealed envelope for you to review at your convenience later.  YOU SHOULD EXPECT: Some feelings of bloating in the abdomen. Passage of more gas than usual.  Walking can help get rid of the air that was put into your GI tract during the procedure and reduce the bloating. If you had a lower endoscopy (such as a colonoscopy or flexible sigmoidoscopy) you may notice spotting of blood in your stool or on the toilet paper. If you underwent a bowel prep for your procedure, you may not have a normal bowel movement for a few days.  Please Note:  You might notice some irritation and congestion in your nose or some drainage.  This is from the oxygen used during your procedure.  There is no need for concern and it should clear up in a day or so.  SYMPTOMS TO REPORT IMMEDIATELY:   Following lower endoscopy (colonoscopy or flexible sigmoidoscopy):  Excessive amounts of blood in the stool  Significant tenderness or worsening of abdominal pains  Swelling of the abdomen that is new, acute  Fever of 100F or higher   For urgent or emergent issues, a gastroenterologist can be reached at any hour by calling [phone].   DIET: Your first meal following the procedure should be a small meal and then it is ok to progress to your normal diet. Heavy or fried foods are harder to digest and may make you feel nauseous or bloated.  Likewise, meals heavy in dairy and vegetables can increase bloating.  Drink plenty of fluids but you should avoid alcoholic beverages for 24  hours.  ACTIVITY:  You should plan to take it easy for the rest of today and you should NOT DRIVE or use heavy machinery until tomorrow (because of the sedation medicines used during the test).    FOLLOW UP: Our staff will call the number listed on your records the next business day following your procedure to check on you and address any questions or concerns that you may have regarding the information given to you following your procedure. If we do not reach you, we will leave a message.  However, if you are feeling well and you are not experiencing any problems, there is no need to return our call.  We will assume that you have returned to your regular daily activities without incident.  If any biopsies were taken you will be contacted by phone or by letter within the next 1-3 weeks.  Please call us at [phone] if you have not heard about the biopsies in 3 weeks.    SIGNATURES/CONFIDENTIALITY: You and/or your care partner have signed paperwork which will be entered into your electronic medical record.  These signatures attest to the fact that that the information above on your After Visit Summary has been reviewed and is understood.  Full responsibility of the confidentiality of this discharge information lies with you and/or your care-partner.   Rx for Robinul 2mg  2 x per day.  Rx given to you to fill. Handouts were given to you on polyps, diverticulosis,  and a high fiber diet with liberal fluid intake sealed in an envelope and given to you. Resume your Plavix tomorrow, 11-04-15. You may resume your other current medications today. Await biopsy results. Please call if any questions or concerns.

## 2015-11-03 NOTE — Op Note (Signed)
[facility]  [employer] [address]. [place], [address]   COLONOSCOPY PROCEDURE REPORT  PATIENT: Kenneth [name], Kenneth [name]  MR#: [id number] BIRTHDATE: 10-28-1946 , 27  yrs. old GENDER: male ENDOSCOPIST: Ladene Artist, MD, Marval Regal REFERRED BY: Velna Hatchet, M.D. PROCEDURE DATE:  11/03/2015 PROCEDURE:   Colonoscopy, diagnostic and Colonoscopy with snare polypectomy First Screening Colonoscopy - Avg.  risk and is 50 yrs.  old or older - No.  Prior Negative Screening - Now for repeat screening. N/A  History of Adenoma - Now for follow-up colonoscopy & has been > or = to 3 yrs.  N/A  Polyps removed today? Yes ASA CLASS:   Class III INDICATIONS: lower abdominal pain and change in bowel habits. MEDICATIONS: Monitored anesthesia care and Propofol 300 mg IV DESCRIPTION OF PROCEDURE:   After the risks benefits and alternatives of the procedure were thoroughly explained, informed consent was obtained.  The digital rectal exam revealed no abnormalities of the rectum.   The LB [id number]-[id number]  endoscope was introduced through the anus and advanced to the cecum, which was identified by both the appendix and ileocecal valve. No adverse events experienced.   The quality of the prep was excellent. (Suprep was used)  The instrument was then slowly withdrawn as the colon was fully examined. Estimated blood loss is zero unless otherwise noted in this procedure report.    COLON FINDINGS: There was mild diverticulosis noted in the sigmoid colon and transverse colon.   Four sessile polyps measuring 5 mm in size were found in the sigmoid colon, descending colon, and transverse colon.  Polypectomies were performed with a cold snare. The resection was complete, the polyp tissue was completely retrieved and sent to histology.   The examination was otherwise normal.  Retroflexed views revealed internal Grade I hemorrhoids. The time to cecum = 0.1 Withdrawal time = 14.6   The scope was withdrawn and the  procedure completed. COMPLICATIONS: There were no immediate complications.  ENDOSCOPIC IMPRESSION: 1.   Mild diverticulosis in the sigmoid colon and transverse colon 2.   Four sessile polyps in the sigmoid, descending, and transverse colon; polypectomies performed with a cold snare 3.   Grade I internal hemorrhoids  RECOMMENDATIONS: 1.  Await pathology results 2.  High fiber diet with liberal fluid intake. 3.  Robinul 2 mg po bid, 1 year of refills 4.  Repeat Colonoscopy in 5 years.  eSigned:  Ladene Artist, MD, Marval Regal 11/03/2015 2:07 PM  ]

## 2015-11-04 ENCOUNTER — Telehealth: Payer: Self-pay | Admitting: *Deleted

## 2015-11-04 NOTE — Telephone Encounter (Signed)
  Follow up Call-  Call back number 11/03/2015 12/02/2013  Post procedure Call Back phone  # [phone]  Permission to leave phone message Yes Yes     Patient questions:  Do you have a fever, pain , or abdominal swelling? No. Pain Score  0 *  Have you tolerated food without any problems? Yes.    Have you been able to return to your normal activities? Yes.    Do you have any questions about your discharge instructions: Diet   No. Medications  No. Follow up visit  No.  Do you have questions or concerns about your Care? No.  Actions: * If pain score is 4 or above: No action needed, pain <4.

## 2015-11-05 ENCOUNTER — Other Ambulatory Visit: Payer: Commercial Managed Care - HMO

## 2015-11-05 ENCOUNTER — Inpatient Hospital Stay: Admission: RE | Admit: 2015-11-05 | Payer: Commercial Managed Care - HMO | Source: Ambulatory Visit

## 2015-11-08 ENCOUNTER — Encounter: Payer: Self-pay | Admitting: Gastroenterology

## 2015-11-09 DIAGNOSIS — L821 Other seborrheic keratosis: Secondary | ICD-10-CM | POA: Diagnosis not present

## 2015-11-09 DIAGNOSIS — L812 Freckles: Secondary | ICD-10-CM | POA: Diagnosis not present

## 2015-11-09 DIAGNOSIS — D485 Neoplasm of uncertain behavior of skin: Secondary | ICD-10-CM | POA: Diagnosis not present

## 2015-11-09 DIAGNOSIS — C4362 Malignant melanoma of left upper limb, including shoulder: Secondary | ICD-10-CM | POA: Diagnosis not present

## 2015-11-10 ENCOUNTER — Ambulatory Visit
Admission: RE | Admit: 2015-11-10 | Discharge: 2015-11-10 | Disposition: A | Discharge status: Home / Self Care | Payer: PPO | Source: Ambulatory Visit | Attending: Internal Medicine | Admitting: Internal Medicine

## 2015-11-10 DIAGNOSIS — N5082 Scrotal pain: Secondary | ICD-10-CM

## 2015-11-10 DIAGNOSIS — N503 Cyst of epididymis: Secondary | ICD-10-CM | POA: Diagnosis not present

## 2015-11-10 DIAGNOSIS — R1030 Lower abdominal pain, unspecified: Secondary | ICD-10-CM

## 2015-11-12 ENCOUNTER — Other Ambulatory Visit: Payer: Self-pay | Admitting: Internal Medicine

## 2015-11-12 DIAGNOSIS — M545 Low back pain: Secondary | ICD-10-CM

## 2015-11-26 ENCOUNTER — Telehealth: Payer: Self-pay | Admitting: Cardiology

## 2015-11-26 DIAGNOSIS — C4362 Malignant melanoma of left upper limb, including shoulder: Secondary | ICD-10-CM | POA: Diagnosis not present

## 2015-11-26 NOTE — Telephone Encounter (Signed)
11/26/2015 Received a referral packet from [facility] on patient for upcoming appointment on 12/15/2015 with Dr. Martinique.  Records given to [facility].  cbr

## 2015-11-29 ENCOUNTER — Telehealth: Payer: Self-pay

## 2015-11-29 NOTE — Telephone Encounter (Signed)
Received surgical clearance from [facility].Dr.Jordan cleared patient for surgery.Advised to hold plavix 1 week prior to surgery.Note faxed to Ammie at fax # [phone].

## 2015-11-30 ENCOUNTER — Other Ambulatory Visit (HOSPITAL_COMMUNITY): Payer: Self-pay | Admitting: Surgery

## 2015-11-30 DIAGNOSIS — C4362 Malignant melanoma of left upper limb, including shoulder: Secondary | ICD-10-CM

## 2015-12-03 ENCOUNTER — Ambulatory Visit (HOSPITAL_COMMUNITY)
Admission: RE | Admit: 2015-12-03 | Discharge: 2015-12-03 | Disposition: A | Discharge status: Home / Self Care | Payer: PPO | Source: Ambulatory Visit | Attending: Surgery | Admitting: Surgery

## 2015-12-03 DIAGNOSIS — C4362 Malignant melanoma of left upper limb, including shoulder: Secondary | ICD-10-CM | POA: Diagnosis not present

## 2015-12-03 MED ORDER — TECHNETIUM TC 99M SULFUR COLLOID FILTERED
0.5000 | Freq: Once | INTRAVENOUS | Status: AC | PRN
  Administered 2015-12-03: 0.5 via INTRADERMAL

## 2015-12-05 ENCOUNTER — Other Ambulatory Visit: Payer: Self-pay | Admitting: Surgery

## 2015-12-05 DIAGNOSIS — C4362 Malignant melanoma of left upper limb, including shoulder: Secondary | ICD-10-CM

## 2015-12-07 ENCOUNTER — Ambulatory Visit (HOSPITAL_COMMUNITY): Payer: PPO

## 2015-12-07 ENCOUNTER — Encounter (HOSPITAL_BASED_OUTPATIENT_CLINIC_OR_DEPARTMENT_OTHER): Payer: Self-pay | Admitting: *Deleted

## 2015-12-08 ENCOUNTER — Encounter (HOSPITAL_BASED_OUTPATIENT_CLINIC_OR_DEPARTMENT_OTHER)
Admission: RE | Admit: 2015-12-08 | Discharge: 2015-12-08 | Disposition: A | Discharge status: Home / Self Care | Payer: PPO | Source: Ambulatory Visit | Attending: Surgery | Admitting: Surgery

## 2015-12-08 DIAGNOSIS — Z01812 Encounter for preprocedural laboratory examination: Secondary | ICD-10-CM | POA: Diagnosis not present

## 2015-12-08 DIAGNOSIS — C4362 Malignant melanoma of left upper limb, including shoulder: Secondary | ICD-10-CM | POA: Diagnosis not present

## 2015-12-08 LAB — BASIC METABOLIC PANEL
ANION GAP: 11 (ref 5–15)
BUN: 22 mg/dL — ABNORMAL HIGH (ref 6–20)
CALCIUM: 9.4 mg/dL (ref 8.9–10.3)
CHLORIDE: 103 mmol/L (ref 101–111)
CO2: 27 mmol/L (ref 22–32)
Creatinine, Ser: 1.07 mg/dL (ref 0.61–1.24)
GFR calc non Af Amer: >60 mL/min (ref >60)
Glucose, Bld: 111 mg/dL — ABNORMAL HIGH (ref 65–99)
POTASSIUM: 4.7 mmol/L (ref 3.5–5.1)
Sodium: 141 mmol/L (ref 135–145)

## 2015-12-12 NOTE — H&P (Signed)
Kenneth [name]. [facility] Location: [facility] Patient #: [id number] DOB: [birth year] Divorced / Language: Cleophus Molt / Race: White Male  History of Present Illness   Patient words: New-Melanoma.   The patient is a 70 year old male who presents with a complaint of melanoma.   His PCP is Dr. Hoover Brunette.  His cardiologist is Dr. Martinique.  Dermatology Dr. Lorenza Cambridge.  The patient is noticed a lesion near his left elbow for some time. However over the last 6 months, the lesion has become more nodular and irritated. He hs seen Dr. Nena Polio who did a biopsy of this lesion. His path report [id number]) - showed nodular type, Clark level IV, Breslow's depth - 3.8 mm, ulceration, T3b, margin - base involved He has a sister with melanoma.  Plan: 1) card clearance with Dr. Martinique, 2) when to stop Plavix (I'd prefer one week), 3) lymphoscintigraphy, 4) then schedule wide excision left arm and left axillary SLNBx  Past Medical History: 1. 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 On plavix 3. Abdomina pain Still being worked up Colonoscopy by Dr. Fuller Plan - 11/03/2015 4. L3-L4 problems He is supposed to get an MRI 5. PTSD - from [place] 6. Arthritis all over 7. Uses Medical marijuana for pain from arthritis and PTSD 8. Right knee replacement - 2014, Alusio 9. HTN 10. Asymptomatic cholelithiasis  Social History: Divorced x 2 Has 3 children: born 1975, 1978, and 66 (2 live in [place] and 1 in [place])  Other Problems Malachi Bonds, CMA; 11/26/2015 8:50 AM) Arthritis Back Pain Chest pain Congestive Heart Failure Diabetes Mellitus Diverticulosis Hemorrhoids High blood pressure Hypercholesterolemia Melanoma Myocardial infarction  Past Surgical History  Malachi Bonds, CMA; 11/26/2015 8:50 AM) Colon Polyp Removal - Colonoscopy Valve Replacement  Diagnostic Studies History Malachi Bonds, CMA; 11/26/2015 8:50 AM) Colonoscopy within last year  Allergies Malachi Bonds, CMA; 11/26/2015 8:51 AM) Crestor *ANTIHYPERLIPIDEMICS* Lipitor *ANTIHYPERLIPIDEMICS* Pravastatin Sodium *ANTIHYPERLIPIDEMICS* Simvastatin *ANTIHYPERLIPIDEMICS*  Medication History (Chemira Jones, CMA; 11/26/2015 8:55 AM) Allopurinol (300MG  Tablet, Oral) Active. Carvedilol (6.25MG  Tablet, Oral) Active. Clopidogrel Bisulfate (75MG  Tablet, Oral) Active. Escitalopram Oxalate (10MG  Tablet, Oral) Active. Furosemide (40MG  Tablet, Oral) Active. Isosorbide Mononitrate ER (30MG  Tablet ER 24HR, Oral) Active. Klor-Con M20 [facility] Tablet ER, Oral) Active. Lisinopril (40MG  Tablet, Oral) Active. Nitroglycerin (0.4MG  Tab Sublingual, Sublingual) Active. Mupirocin (2% Ointment, External) Active. Aspirin (81MG  Tablet, Oral) Active. Cetirizine HCl (10MG  Tablet, Oral) Active. Vitamin D3 (2000UNIT Tablet, Oral) Active. Colchicine (0.6MG  Tablet, Oral) Active. Fluticasone Propionate (50MCG/ACT Suspension, Nasal) Active. Multivitamin Adult (Oral) Active. Probiotic Daily (Oral) Active. Tetrahydrazoline HCl (0.05% Solution, Ophthalmic) Active. Medications Reconciled  Social History Malachi Bonds, CMA; 11/26/2015 8:50 AM) Alcohol use Moderate alcohol use. Caffeine use Carbonated beverages. Illicit drug use Prefer to discuss with provider.  Family History Malachi Bonds, CMA; 11/26/2015 8:50 AM) Diabetes Mellitus Mother. Melanoma Sister. Prostate Cancer Father.   Review of Systems Malachi Bonds CMA; 11/26/2015 8:50 AM) General Present- Fatigue. Not Present- Appetite Loss, Chills, Fever, Night Sweats, Weight Gain and Weight Loss. Skin Present- Change in Wart/Mole. Not Present- Dryness, Hives, Jaundice, New Lesions, Non-Healing Wounds, Rash and Ulcer. HEENT  Not Present- Earache, Hearing Loss, Hoarseness, Nose Bleed, Oral Ulcers, Ringing in the Ears, Seasonal Allergies, Sinus Pain, Sore Throat, Visual Disturbances, Wears glasses/contact lenses and Yellow Eyes. Respiratory Present- Snoring. Not Present- Bloody sputum, Chronic  Cough, Difficulty Breathing and Wheezing. Breast Not Present- Breast Mass, Breast Pain, Nipple Discharge and Skin Changes. Male Genitourinary Present- Frequency, Nocturia and Urgency. Not Present- Blood in Urine, Change in Urinary Stream, Impotence, Painful Urination and Urine Leakage. Neurological Not Present- Decreased Memory, Fainting, Headaches, Numbness, Seizures, Tingling, Tremor, Trouble walking and Weakness. Psychiatric Present- Anxiety. Not Present- Bipolar, Change in Sleep Pattern, Depression, Fearful and Frequent crying. Endocrine Not Present- Cold Intolerance, Excessive Hunger, Hair Changes, Heat Intolerance, Hot flashes and New Diabetes. Hematology Not Present- Easy Bruising, Excessive bleeding, Gland problems, HIV and Persistent Infections.  Vitals (Chemira Jones CMA; 11/26/2015 8:51 AM) 11/26/2015 8:50 AM Weight: 206 lb Height: 68in Body Surface Area: 2.07 m Body Mass Index: 31.32 kg/m  Temp.: 98.9F(Oral)  Pulse: 76 (Regular)  BP: 150/90 (Sitting, Left Arm, Standard)  Physical Exam  General: WN older WM alert and generally healthy appearing. HEENT: Normal. Pupils equal.  Neck: Supple. No mass. No thyroid mass.  Lymph Nodes: No supraclavicular, cervical or axillary nodes. Particular attention to left axilla, but I feel no node or mass.  Lungs: Clear to auscultation and symmetric breath sounds. Heart: RRR. 2/6 systolic murmur (barely)  Abdomen: Soft. No mass. No tenderness. No hernia. Normal bowel sounds. No abdominal scars. Rectal: Not done.  Extremities: Left arm: 1.0 cm ulcer left arm at elbow (recent biopsy site). scar on top of left hand from fall injury No satellite  lesions Right arm: Healing lesion from "spider bite" in Dec 2016  Neurologic: Grossly intact to motor and sensory function. Psychiatric: Has normal mood and affect. Behavior is normal.   Assessment & Plan  1.  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 Martinique  2) Instructions to stop Plavix  3) Lymphscitigraphy of left arm  4) Wide excision of left arm melanoma and left axillary sentinel lymph node biopsy  Addendum Note(Charda Janis H. Lucia Gaskins MD; 12/05/2015 4:19 PM)  Cleared by Dr. Martinique.  We can hold Plavix for 7 days.  His lymphoscitigraphy showed two axillary nodes and an interval lymph node in the upper arm.  I left message on patient's AM. Note: at the time of this note, his phone number in our [employer] is incorrect. His correct phone number is: [phone]  Addendum Note(Latiana Tomei H. Lucia Gaskins MD; 12/07/2015 3:18 PM)  I got the patient on the phone.  I reviewed the findings from cards and lymphscyntigraphy. He is scheduled for next Monday, 12/13/2015.  2. 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 Martinique MI in 1999 3 stents and 2 stents - 2013/2014 Hospitalized 4 times last year for cardiac issues  3. On plavix 4. Abdomina pain  Still being worked up  Colonoscopy by Dr. Fuller Plan - 11/03/2015 5. L3-L4 problems  He is supposed to get an MRI 6. PTSD - from [place] 7. Arthritis all over 8. Uses Medical marijuana for pain from arthritis and PTSD 9. Right knee replacement - 2014, Alusio 10. HTN 11. Asymptomatic cholelithiasis   Alphonsa Overall, MD, [facility] Surgery Pager: [phone] Office phone:  [phone]

## 2015-12-13 ENCOUNTER — Ambulatory Visit (HOSPITAL_BASED_OUTPATIENT_CLINIC_OR_DEPARTMENT_OTHER): Payer: PPO | Admitting: Anesthesiology

## 2015-12-13 ENCOUNTER — Ambulatory Visit (HOSPITAL_BASED_OUTPATIENT_CLINIC_OR_DEPARTMENT_OTHER)
Admission: RE | Admit: 2015-12-13 | Discharge: 2015-12-13 | Disposition: A | Discharge status: Home / Self Care | Payer: PPO | Source: Ambulatory Visit | Attending: Surgery | Admitting: Surgery

## 2015-12-13 ENCOUNTER — Ambulatory Visit (HOSPITAL_COMMUNITY)
Admission: RE | Admit: 2015-12-13 | Discharge: 2015-12-13 | Disposition: A | Discharge status: Home / Self Care | Payer: PPO | Source: Ambulatory Visit | Attending: Surgery | Admitting: Surgery

## 2015-12-13 ENCOUNTER — Encounter (HOSPITAL_BASED_OUTPATIENT_CLINIC_OR_DEPARTMENT_OTHER)
Admission: RE | Disposition: A | Discharge status: Home / Self Care | Payer: Self-pay | Source: Ambulatory Visit | Attending: Surgery

## 2015-12-13 ENCOUNTER — Encounter (HOSPITAL_BASED_OUTPATIENT_CLINIC_OR_DEPARTMENT_OTHER): Payer: Self-pay

## 2015-12-13 DIAGNOSIS — G8918 Other acute postprocedural pain: Secondary | ICD-10-CM | POA: Diagnosis not present

## 2015-12-13 DIAGNOSIS — I255 Ischemic cardiomyopathy: Secondary | ICD-10-CM | POA: Insufficient documentation

## 2015-12-13 DIAGNOSIS — Z79899 Other long term (current) drug therapy: Secondary | ICD-10-CM | POA: Diagnosis not present

## 2015-12-13 DIAGNOSIS — Z808 Family history of malignant neoplasm of other organs or systems: Secondary | ICD-10-CM | POA: Diagnosis not present

## 2015-12-13 DIAGNOSIS — F4310 Post-traumatic stress disorder, unspecified: Secondary | ICD-10-CM | POA: Diagnosis not present

## 2015-12-13 DIAGNOSIS — L98499 Non-pressure chronic ulcer of skin of other sites with unspecified severity: Secondary | ICD-10-CM | POA: Diagnosis not present

## 2015-12-13 DIAGNOSIS — I252 Old myocardial infarction: Secondary | ICD-10-CM | POA: Diagnosis not present

## 2015-12-13 DIAGNOSIS — F17210 Nicotine dependence, cigarettes, uncomplicated: Secondary | ICD-10-CM | POA: Diagnosis not present

## 2015-12-13 DIAGNOSIS — Z7902 Long term (current) use of antithrombotics/antiplatelets: Secondary | ICD-10-CM | POA: Insufficient documentation

## 2015-12-13 DIAGNOSIS — M79602 Pain in left arm: Secondary | ICD-10-CM | POA: Diagnosis not present

## 2015-12-13 DIAGNOSIS — E119 Type 2 diabetes mellitus without complications: Secondary | ICD-10-CM | POA: Diagnosis not present

## 2015-12-13 DIAGNOSIS — Z7982 Long term (current) use of aspirin: Secondary | ICD-10-CM | POA: Diagnosis not present

## 2015-12-13 DIAGNOSIS — I110 Hypertensive heart disease with heart failure: Secondary | ICD-10-CM | POA: Diagnosis not present

## 2015-12-13 DIAGNOSIS — R109 Unspecified abdominal pain: Secondary | ICD-10-CM | POA: Diagnosis not present

## 2015-12-13 DIAGNOSIS — I2510 Atherosclerotic heart disease of native coronary artery without angina pectoris: Secondary | ICD-10-CM | POA: Diagnosis not present

## 2015-12-13 DIAGNOSIS — M159 Polyosteoarthritis, unspecified: Secondary | ICD-10-CM | POA: Insufficient documentation

## 2015-12-13 DIAGNOSIS — Z955 Presence of coronary angioplasty implant and graft: Secondary | ICD-10-CM | POA: Diagnosis not present

## 2015-12-13 DIAGNOSIS — I509 Heart failure, unspecified: Secondary | ICD-10-CM | POA: Diagnosis not present

## 2015-12-13 DIAGNOSIS — Z96651 Presence of right artificial knee joint: Secondary | ICD-10-CM | POA: Diagnosis not present

## 2015-12-13 DIAGNOSIS — C4362 Malignant melanoma of left upper limb, including shoulder: Secondary | ICD-10-CM | POA: Diagnosis not present

## 2015-12-13 DIAGNOSIS — K8020 Calculus of gallbladder without cholecystitis without obstruction: Secondary | ICD-10-CM | POA: Insufficient documentation

## 2015-12-13 DIAGNOSIS — I10 Essential (primary) hypertension: Secondary | ICD-10-CM | POA: Diagnosis not present

## 2015-12-13 HISTORY — PX: EXCISION MELANOMA WITH SENTINEL LYMPH NODE BIOPSY: SHX5628

## 2015-12-13 HISTORY — DX: Malignant melanoma of skin, unspecified: C43.9

## 2015-12-13 SURGERY — EXCISION, MELANOMA, WITH SENTINEL LYMPH NODE BIOPSY
Anesthesia: General | Laterality: Left

## 2015-12-13 MED ORDER — SUCCINYLCHOLINE CHLORIDE 20 MG/ML IJ SOLN
INTRAMUSCULAR | Status: AC
  Filled 2015-12-13: qty 1

## 2015-12-13 MED ORDER — TECHNETIUM TC 99M SULFUR COLLOID FILTERED
0.5000 | Freq: Once | INTRAVENOUS | Status: AC | PRN
  Administered 2015-12-13: 0.5 via INTRADERMAL

## 2015-12-13 MED ORDER — ATROPINE SULFATE 0.4 MG/ML IJ SOLN
INTRAMUSCULAR | Status: AC
  Filled 2015-12-13: qty 1

## 2015-12-13 MED ORDER — OXYCODONE HCL 5 MG/5ML PO SOLN: 5.0000 mg | Freq: Once | ORAL | Status: AC | PRN

## 2015-12-13 MED ORDER — CHLORHEXIDINE GLUCONATE 4 % EX LIQD: 1.0000 "application " | Freq: Once | CUTANEOUS | Status: DC

## 2015-12-13 MED ORDER — PROMETHAZINE HCL 25 MG/ML IJ SOLN: 6.2500 mg | INTRAMUSCULAR | Status: DC | PRN

## 2015-12-13 MED ORDER — FENTANYL CITRATE (PF) 100 MCG/2ML IJ SOLN
INTRAMUSCULAR | Status: AC
  Filled 2015-12-13: qty 2

## 2015-12-13 MED ORDER — OXYCODONE HCL 5 MG PO TABS
ORAL_TABLET | ORAL | Status: AC
  Filled 2015-12-13: qty 1

## 2015-12-13 MED ORDER — DEXAMETHASONE SODIUM PHOSPHATE 10 MG/ML IJ SOLN
INTRAMUSCULAR | Status: AC
  Filled 2015-12-13: qty 1

## 2015-12-13 MED ORDER — ONDANSETRON HCL 4 MG/2ML IJ SOLN
INTRAMUSCULAR | Status: DC | PRN
  Administered 2015-12-13: 4 mg via INTRAVENOUS

## 2015-12-13 MED ORDER — EPHEDRINE SULFATE 50 MG/ML IJ SOLN
INTRAMUSCULAR | Status: AC
  Filled 2015-12-13: qty 1

## 2015-12-13 MED ORDER — EPHEDRINE SULFATE 50 MG/ML IJ SOLN
INTRAMUSCULAR | Status: DC | PRN
  Administered 2015-12-13: 10 mg via INTRAVENOUS
  Administered 2015-12-13: 5 mg via INTRAVENOUS

## 2015-12-13 MED ORDER — HYDROCODONE-ACETAMINOPHEN 5-325 MG PO TABS: 1.0000 | ORAL_TABLET | Freq: Four times a day (QID) | ORAL | Status: AC | PRN

## 2015-12-13 MED ORDER — DEXAMETHASONE SODIUM PHOSPHATE 4 MG/ML IJ SOLN
INTRAMUSCULAR | Status: DC | PRN
  Administered 2015-12-13: 10 mg via INTRAVENOUS

## 2015-12-13 MED ORDER — MEPERIDINE HCL 25 MG/ML IJ SOLN: 6.2500 mg | INTRAMUSCULAR | Status: DC | PRN

## 2015-12-13 MED ORDER — SCOPOLAMINE 1 MG/3DAYS TD PT72: 1.0000 | MEDICATED_PATCH | Freq: Once | TRANSDERMAL | Status: DC | PRN

## 2015-12-13 MED ORDER — GLYCOPYRROLATE 0.2 MG/ML IJ SOLN: 0.2000 mg | Freq: Once | INTRAMUSCULAR | Status: DC | PRN

## 2015-12-13 MED ORDER — CEFAZOLIN SODIUM-DEXTROSE 2-3 GM-% IV SOLR
INTRAVENOUS | Status: AC
  Filled 2015-12-13: qty 50

## 2015-12-13 MED ORDER — PROPOFOL 500 MG/50ML IV EMUL
INTRAVENOUS | Status: AC
  Filled 2015-12-13: qty 50

## 2015-12-13 MED ORDER — BUPIVACAINE-EPINEPHRINE (PF) 0.25% -1:200000 IJ SOLN
INTRAMUSCULAR | Status: DC | PRN
  Administered 2015-12-13: 19 mL

## 2015-12-13 MED ORDER — LACTATED RINGERS IV SOLN
INTRAVENOUS | Status: DC
  Administered 2015-12-13 (×2): via INTRAVENOUS

## 2015-12-13 MED ORDER — SODIUM CHLORIDE 0.9 % IJ SOLN
INTRAMUSCULAR | Status: AC
  Filled 2015-12-13: qty 10

## 2015-12-13 MED ORDER — FENTANYL CITRATE (PF) 100 MCG/2ML IJ SOLN
50.0000 ug | INTRAMUSCULAR | Status: AC | PRN
  Administered 2015-12-13: 50 ug via INTRAVENOUS
  Administered 2015-12-13: 100 ug via INTRAVENOUS
  Administered 2015-12-13 (×2): 50 ug via INTRAVENOUS

## 2015-12-13 MED ORDER — BUPIVACAINE HCL (PF) 0.25 % IJ SOLN
INTRAMUSCULAR | Status: AC
  Filled 2015-12-13: qty 30

## 2015-12-13 MED ORDER — MIDAZOLAM HCL 2 MG/2ML IJ SOLN
INTRAMUSCULAR | Status: AC
  Filled 2015-12-13: qty 2

## 2015-12-13 MED ORDER — METHYLENE BLUE 0.5 % INJ SOLN
INTRAVENOUS | Status: DC | PRN
  Administered 2015-12-13: 2 mL via SUBMUCOSAL

## 2015-12-13 MED ORDER — MIDAZOLAM HCL 2 MG/2ML IJ SOLN
1.0000 mg | INTRAMUSCULAR | Status: DC | PRN
  Administered 2015-12-13 (×2): 1 mg via INTRAVENOUS
  Administered 2015-12-13: 2 mg via INTRAVENOUS

## 2015-12-13 MED ORDER — HYDROMORPHONE HCL 1 MG/ML IJ SOLN: 0.2500 mg | INTRAMUSCULAR | Status: DC | PRN

## 2015-12-13 MED ORDER — ONDANSETRON HCL 4 MG/2ML IJ SOLN
INTRAMUSCULAR | Status: AC
  Filled 2015-12-13: qty 2

## 2015-12-13 MED ORDER — BUPIVACAINE-EPINEPHRINE (PF) 0.5% -1:200000 IJ SOLN
INTRAMUSCULAR | Status: DC | PRN
  Administered 2015-12-13: 30 mL via PERINEURAL

## 2015-12-13 MED ORDER — PHENYLEPHRINE 40 MCG/ML (10ML) SYRINGE FOR IV PUSH (FOR BLOOD PRESSURE SUPPORT)
PREFILLED_SYRINGE | INTRAVENOUS | Status: AC
  Filled 2015-12-13: qty 10

## 2015-12-13 MED ORDER — LIDOCAINE HCL (CARDIAC) 20 MG/ML IV SOLN
INTRAVENOUS | Status: AC
  Filled 2015-12-13: qty 5

## 2015-12-13 MED ORDER — LIDOCAINE HCL (CARDIAC) 20 MG/ML IV SOLN
INTRAVENOUS | Status: DC | PRN
  Administered 2015-12-13: 75 mg via INTRAVENOUS

## 2015-12-13 MED ORDER — OXYCODONE HCL 5 MG PO TABS
5.0000 mg | ORAL_TABLET | Freq: Once | ORAL | Status: AC | PRN
  Administered 2015-12-13: 5 mg via ORAL

## 2015-12-13 MED ORDER — CEFAZOLIN SODIUM-DEXTROSE 2-3 GM-% IV SOLR
2.0000 g | INTRAVENOUS | Status: AC
  Administered 2015-12-13: 2 g via INTRAVENOUS

## 2015-12-13 SURGICAL SUPPLY — 59 items
APPLIER CLIP 11 MED OPEN (CLIP)
BANDAGE ACE 4X5 VEL STRL LF (GAUZE/BANDAGES/DRESSINGS) ×3 IMPLANT
BLADE HEX COATED 2.75 (ELECTRODE) ×3 IMPLANT
BLADE SURG 10 STRL SS (BLADE) ×3 IMPLANT
BLADE SURG 15 STRL LF DISP TIS (BLADE) ×1 IMPLANT
BLADE SURG 15 STRL SS (BLADE) ×2
BNDG COHESIVE 4X5 TAN STRL (GAUZE/BANDAGES/DRESSINGS) ×3 IMPLANT
BNDG GAUZE ELAST 4 BULKY (GAUZE/BANDAGES/DRESSINGS) ×3 IMPLANT
CANISTER SUCT 1200ML W/VALVE (MISCELLANEOUS) ×3 IMPLANT
CHLORAPREP W/TINT 26ML (MISCELLANEOUS) ×3 IMPLANT
CLIP APPLIE 11 MED OPEN (CLIP) IMPLANT
CLIP TI WIDE RED SMALL 6 (CLIP) IMPLANT
COVER BACK TABLE 60X90IN (DRAPES) ×3 IMPLANT
COVER MAYO STAND STRL (DRAPES) ×3 IMPLANT
COVER PROBE W GEL 5X96 (DRAPES) ×3 IMPLANT
DECANTER SPIKE VIAL GLASS SM (MISCELLANEOUS) IMPLANT
DRAPE LAPAROSCOPIC ABDOMINAL (DRAPES) IMPLANT
DRAPE LAPAROTOMY 100X72 PEDS (DRAPES) IMPLANT
DRAPE ORTHO SPLIT 77X108 STRL (DRAPES) ×4
DRAPE SURG ORHT 6 SPLT 77X108 (DRAPES) ×2 IMPLANT
DRAPE UTILITY XL STRL (DRAPES) ×3 IMPLANT
ELECT COATED BLADE 2.86 ST (ELECTRODE) ×3 IMPLANT
ELECT REM PT RETURN 9FT ADLT (ELECTROSURGICAL) ×3
ELECTRODE REM PT RTRN 9FT ADLT (ELECTROSURGICAL) ×1 IMPLANT
GAUZE SPONGE 4X4 12PLY STRL (GAUZE/BANDAGES/DRESSINGS) ×3 IMPLANT
GAUZE SPONGE 4X4 16PLY XRAY LF (GAUZE/BANDAGES/DRESSINGS) ×3 IMPLANT
GLOVE BIOGEL M STRL SZ7.5 (GLOVE) ×3 IMPLANT
GLOVE BIOGEL PI IND STRL 8 (GLOVE) ×2 IMPLANT
GLOVE BIOGEL PI INDICATOR 8 (GLOVE) ×4
GLOVE SURG SIGNA 7.5 PF LTX (GLOVE) ×3 IMPLANT
GLOVE SURG SS PI 7.5 STRL IVOR (GLOVE) ×3 IMPLANT
GOWN STRL REUS W/ TWL LRG LVL3 (GOWN DISPOSABLE) IMPLANT
GOWN STRL REUS W/ TWL XL LVL3 (GOWN DISPOSABLE) ×3 IMPLANT
GOWN STRL REUS W/TWL LRG LVL3 (GOWN DISPOSABLE)
GOWN STRL REUS W/TWL XL LVL3 (GOWN DISPOSABLE) ×6
LIQUID BAND (GAUZE/BANDAGES/DRESSINGS) ×3 IMPLANT
NEEDLE HYPO 25X1 1.5 SAFETY (NEEDLE) ×6 IMPLANT
NS IRRIG 1000ML POUR BTL (IV SOLUTION) ×3 IMPLANT
PACK BASIN DAY SURGERY FS (CUSTOM PROCEDURE TRAY) ×3 IMPLANT
PAD ALCOHOL SWAB (MISCELLANEOUS) ×6 IMPLANT
PENCIL BUTTON HOLSTER BLD 10FT (ELECTRODE) ×3 IMPLANT
SHEET MEDIUM DRAPE 40X70 STRL (DRAPES) ×3 IMPLANT
SLEEVE SCD COMPRESS KNEE MED (MISCELLANEOUS) ×3 IMPLANT
SPONGE GAUZE 4X4 12PLY STER LF (GAUZE/BANDAGES/DRESSINGS) IMPLANT
SPONGE LAP 18X18 X RAY DECT (DISPOSABLE) ×3 IMPLANT
STAPLER VISISTAT 35W (STAPLE) IMPLANT
STOCKINETTE IMPERVIOUS LG (DRAPES) ×3 IMPLANT
SUT ETHILON 2 0 FS 18 (SUTURE) ×12 IMPLANT
SUT MON AB 5-0 PS2 18 (SUTURE) ×3 IMPLANT
SUT SILK 3 0 TIES 17X18 (SUTURE)
SUT SILK 3-0 18XBRD TIE BLK (SUTURE) IMPLANT
SUT VICRYL 3-0 CR8 SH (SUTURE) ×6 IMPLANT
SYR CONTROL 10ML LL (SYRINGE) ×6 IMPLANT
SYRINGE 1CC 25X5/8 TB ECLIPSE (MISCELLANEOUS) IMPLANT
TOWEL OR 17X24 6PK STRL BLUE (TOWEL DISPOSABLE) ×3 IMPLANT
TOWEL OR NON WOVEN STRL DISP B (DISPOSABLE) IMPLANT
TUBE CONNECTING 20'X1/4 (TUBING) ×1
TUBE CONNECTING 20X1/4 (TUBING) ×2 IMPLANT
YANKAUER SUCT BULB TIP NO VENT (SUCTIONS) ×3 IMPLANT

## 2015-12-13 NOTE — Op Note (Deleted)
NAME:  Kenneth [name], Kenneth [name] NO.:  [id number]  MEDICAL RECORD NO.:  [id number]  LOCATION:  [facility]                          FACILITY:  [facility]  PHYSICIAN:  Fenton Malling. Lucia Gaskins, M.D.  DATE OF BIRTH:  Aug 08, 1946  DATE OF PROCEDURE:  12/13/2015                              OPERATIVE REPORT   PREOPERATIVE DIAGNOSIS:  Left arm melanoma, 3.8 mm deep with positive deep margins.  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  PROCEDURE:  Wide excision of left arm melanoma, left axillary sentinel lymph node biopsy x 2.  SURGEON:  Fenton Malling. Lucia Gaskins, M.D.  FIRST ASSISTANT:  None.  ANESTHESIA:  General endotracheal, supervised by Dr. Everlean Cherry. Denenny.  ESTIMATED BLOOD LOSS:  Less than 50 mL.  Local anesthetic was 20 mL of 0.25% Marcaine.  SPECIMEN: 1. Left axillary sentinel lymph node x2.  Both were hot but not blue. 2. Left wide excision of left arm melanoma with a long suture thenar, short suture cranial.  INDICATION FOR PROCEDURE:  [name] is a 70 year old white male whose primary care physician is Dr. Velna Hatchet.  He had a lesion of his left forearm which was biopsied by Dr. Hilarie Fredrickson. Allyson Sabal that showed a melanoma, nodular type, Clark's level 4, Breslow depth 3.8 mm with ulceration in the base involved with tumor.  This was a T3b tumor. I discussed with him about a wider excision of the melanoma and a sentinel lymph node biopsy.  He underwent lymphoscintigraphy whose report showed a possible interval axillary node in his left upper arm with two axillary lymph nodes.  He now comes for wider excision of his left forearm melanoma and axillary lymph node biopsy.  The indications and risks of surgery were explained to the patient. Potential risks include bleeding, infection, nerve injury, and lymphedema of his arm, also the need for further surgery.  OPERATIVE NOTE:   The patient was taken to the room #6 at [facility] Day surgery.  His left  arm and axilla were prepped with ChloraPrep and sterilely draped.  This was under general anesthesia.    A time-out was held, and surgical checklist run.  I injected about 1 mL of 40% methylene blue in 4 spots around the healing ulcer of his left forearm.  I then started with the left axillary dissection.  I could find 2 areas of nodes which were hot.  I made a midaxillary incision, cut into the axillary fat, and I found an anterior lymph node and a more posterior lymph node, each with counts between about 50 and 60 with a background of 0.  Neither lymph node was blue. Each node appeared to be about 5 or 6 mm in size.  So, these nodes were sent together.  I irrigated the wound out with saline.  I closed the deep layers with 3-0 Vicryl suture, the skin with a 4-0 Monocryl suture and will paint this with LiquiBand at the end of the case.  I then went over the lesion on his left arm.  This was on the thenar side in his antecubital area.  I tried to excise an area about 1.5 cm wide  of the scar on both sides.  The total incision was approximately 9 x 4 cm.  I went down to the fascia.  I took the thenar basilic vein with a specimen taken this off the muscle.  I marked a long suture on the thenar side, a short suture on the cranial side, and sent this to Pathology.  I then irrigated the wound, I freed up the subcutaneous fat to pull the skin together better.  I then put 3- 0 Vicryl sutures in the subcutaneous sutures, and used interrupted 2-0 nylon sutures in the skin because there was a fair amount of tension, and I was able to straighten his arm again with some tension in the skin.  After the completion, the wound came together pretty well.  I then placed 4 x 4 gauze on the wound, wrapped it with a Kerlix, and wrapped with an Ace and had Dermabond placed on the left axillary wound.  The patient tolerated the procedure well, was transported to the recovery room in good condition.  Sponge and needle count were  correct at the end of the case.  I will let him restart his Plavix back in 2 days postop if he is doing well.  I have discussed this with the patient.   avid H. Lucia Gaskins, M.D., [facility], scribe for Epic   DHN/MEDQ  D:  12/13/2015  T:  12/13/2015  Job:  [id number]  cc:   Dr. Velna Hatchet Dr. Hilarie Fredrickson. Allyson Sabal

## 2015-12-13 NOTE — Op Note (Deleted)
NAME:  Kenneth [name] NO.:  [id number]  MEDICAL RECORD NO.:  [id number]  LOCATION:  [facility]                          FACILITY:  [place]  PHYSICIAN:  Fenton Malling. Lucia Gaskins, M.D.  DATE OF BIRTH:  06-Oct-1946  DATE OF PROCEDURE:  12/13/2015                              OPERATIVE REPORT   PREOPERATIVE DIAGNOSIS:  Left arm melanoma, 3.8 mm deep with positive deep margins.  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  PROCEDURE:  Wide excision of left arm melanoma, left axillary sentinel lymph node biopsy x 2.  SURGEON:  Fenton Malling. Lucia Gaskins, M.D.  FIRST ASSISTANT:  None.  ANESTHESIA:  General endotracheal, supervised by Dr. Everlean Cherry. Denenny.  ESTIMATED BLOOD LOSS:  Less than 50 mL.  Local anesthetic was 20 mL of 0.25% Marcaine.  SPECIMEN: 1. Left axillary sentinel lymph node x2.  Both were hot but not blue. 2. Left wide excision of left arm melanoma with a long suture thenar, short suture cranial.  INDICATION FOR PROCEDURE:  [name] is a 70 year old white male whose primary care physician is Dr. Velna Hatchet.  He had a lesion of his left forearm which was biopsied by Dr. Hilarie Fredrickson. Allyson Sabal that showed a melanoma, nodular type, Clark's level 4, Breslow depth 3.8 mm with ulceration in the base involved with tumor.  This was a T3b tumor. I discussed with him about a wider excision of the melanoma and a sentinel lymph node biopsy.  He underwent lymphoscintigraphy whose report showed a possible interval axillary node in his left upper arm with two axillary lymph nodes.  He now comes for wider excision of his left forearm melanoma and axillary lymph node biopsy.  The indications and risks of surgery were explained to the patient. Potential risks include bleeding, infection, nerve injury, and lymphedema of his arm, also the need for further surgery.  OPERATIVE NOTE:   The patient was taken to the room #6 at [facility] Day surgery.  His left  arm and axilla were prepped with ChloraPrep and sterilely draped.  This was under general anesthesia.    A time-out was held, and surgical checklist run.  I injected about 1 mL of 40% methylene blue in 4 spots around the healing ulcer of his left forearm.  I then started with the left axillary dissection.  I could find 2 areas of nodes which were hot.  I made a midaxillary incision, cut into the axillary fat, and I found an anterior lymph node and a more posterior lymph node, each with counts between about 50 and 60 with a background of 0.  Neither lymph node was blue. Each node appeared to be about 5 or 6 mm in size.  So, these nodes were sent together.  I irrigated the wound out with saline.  I closed the deep layers with 3-0 Vicryl suture, the skin with a 4-0 Monocryl suture and will paint this with LiquiBand at the end of the case.  I then went over the lesion on his left arm.  This was on the thenar side in his antecubital area.  I tried to excise an area about 1.5 cm wide  of the scar on both sides.  The total incision was approximately 9 x 4 cm.  I went down to the fascia.  I took the thenar basilic vein with a specimen taken this off the muscle.  I marked a long suture on the thenar side, a short suture on the cranial side, and sent this to Pathology.  I then irrigated the wound, I freed up the subcutaneous fat to pull the skin together better.  I then put 3- 0 Vicryl sutures in the subcutaneous sutures, and used interrupted 2-0 nylon sutures in the skin because there was a fair amount of tension, and I was able to straighten his arm again with some tension in the skin.  After the completion, the wound came together pretty well.  I then placed 4 x 4 gauze on the wound, wrapped it with a Kerlix, and wrapped with an Ace and had Dermabond placed on the left axillary wound.  The patient tolerated the procedure well, was transported to the recovery room in good condition.  Sponge and needle count were  correct at the end of the case.  I will let him restart his Plavix back in 2 days postop if he is doing well.  I have discussed this with the patient.   avid H. Lucia Gaskins, M.D., [facility], scribe for Epic   DHN/MEDQ  D:  12/13/2015  T:  12/13/2015  Job:  [id number]  cc:   Dr. Velna Hatchet Dr. Hilarie Fredrickson. Allyson Sabal

## 2015-12-13 NOTE — Anesthesia Postprocedure Evaluation (Signed)
Anesthesia Post Note  Patient: Kenneth [name]  Procedure(s) Performed: Procedure(s) (LRB): WIDE EXCISION LEFT ARM  MELANOMA WITH LEFT AXILLARY  SENTINEL LYMPH NODE  (Left)  Patient location during evaluation: PACU Anesthesia Type: General Level of consciousness: awake and alert Pain management: pain level controlled Vital Signs Assessment: post-procedure vital signs reviewed and stable Respiratory status: spontaneous breathing, nonlabored ventilation, respiratory function stable and patient connected to nasal cannula oxygen Cardiovascular status: blood pressure returned to baseline and stable Postop Assessment: no signs of nausea or vomiting Anesthetic complications: no    Last Vitals:  Filed Vitals:   12/13/15 1630 12/13/15 1645  BP: 118/70 115/70  Pulse: 75 70  Temp:    Resp: 15 14    Last Pain:  Filed Vitals:   12/13/15 1733  PainSc: 3                  Leonell Lobdell J

## 2015-12-13 NOTE — Transfer of Care (Signed)
Immediate Anesthesia Transfer of Care Note  Patient: Kenneth [name]  Procedure(s) Performed: Procedure(s): WIDE EXCISION LEFT ARM  MELANOMA WITH LEFT AXILLARY  SENTINEL LYMPH NODE  (Left)  Patient Location: PACU  Anesthesia Type:GA combined with regional for post-op pain  Level of Consciousness: sedated, lethargic and responds to stimulation  Airway & Oxygen Therapy: Patient Spontanous Breathing and Patient connected to face mask oxygen  Post-op Assessment: Report given to RN and Post -op Vital signs reviewed and stable  Post vital signs: Reviewed and stable  Last Vitals:  Filed Vitals:   12/13/15 1325 12/13/15 1330  BP: 82/70 120/79  Pulse: 65 65  Temp:    Resp: 9 15    Complications: No apparent anesthesia complications

## 2015-12-13 NOTE — Discharge Instructions (Signed)
[facility] - DISCHARGE INSTRUCTIONS TO PATIENT   Activity:  Driving - May drive in 2 or 3 days, if doing well.   Lifting - No lifting more than 15 pounds for 2 weeks.              Wound Care:   Leave dressing for two days, then may removed dressing and shower.  Rewrap the arm with the ACE bandage after showering. Keep arm elevated on pillows to prevent swelling. May use ice packs as needed to decrease swelling.  Diet:  As tolerated  Follow up appointment:  Call Dr. Pollie Friar office [facility]) at [phone] for an appointment in 3 weeks.  Medications and dosages:  Resume your home medications.  You have a prescription for:  Vicodin             May restart Plavix in 2 days   Call Dr. Lucia Gaskins or his office  [phone]) if you have:  Temperature greater than 100.4,  Severe uncontrolled pain,  Redness, tenderness, or signs of infection (pain, swelling, redness, odor or green/yellow discharge around the site),  Any other questions or concerns you may have after discharge.  In an emergency, call 911 or go to an Emergency Department at a nearby hospital.    Post Anesthesia Home Care Instructions  Activity: Get plenty of rest for the remainder of the day. A responsible adult should stay with you for 24 hours following the procedure.  For the next 24 hours, DO NOT: -Drive a car -Paediatric nurse -Drink alcoholic beverages -Take any medication unless instructed by your physician -Make any legal decisions or sign important papers.  Meals: Start with liquid foods such as gelatin or soup. Progress to regular foods as tolerated. Avoid greasy, spicy, heavy foods. If nausea and/or vomiting occur, drink only clear liquids until the nausea and/or vomiting subsides. Call your physician if vomiting continues.  Special Instructions/Symptoms: Your throat may feel dry or sore from the anesthesia or the breathing tube placed in your throat during surgery. If this  causes discomfort, gargle with warm salt water. The discomfort should disappear within 24 hours.  If you had a scopolamine patch placed behind your ear for the management of post- operative nausea and/or vomiting:  1. The medication in the patch is effective for 72 hours, after which it should be removed.  Wrap patch in a tissue and discard in the trash. Wash hands thoroughly with soap and water. 2. You may remove the patch earlier than 72 hours if you experience unpleasant side effects which may include dry mouth, dizziness or visual disturbances. 3. Avoid touching the patch. Wash your hands with soap and water after contact with the patch.

## 2015-12-13 NOTE — Anesthesia Procedure Notes (Addendum)
Anesthesia Regional Block:  Pectoralis block  Pre-Anesthetic Checklist: ,, timeout performed, Correct Patient, Correct Site, Correct Laterality, Correct Procedure, Correct Position, site marked, Risks and benefits discussed,  Surgical consent,  Pre-op evaluation,  At surgeon's request and post-op pain management  Laterality: Left and Upper  Prep: chloraprep       Needles:  Injection technique: Single-shot  Needle Type: Echogenic Needle     Needle Length: 9cm 9 cm Needle Gauge: 21 and 21 G    Additional Needles:  Procedures: ultrasound guided (picture in chart) Pectoralis block Narrative:  Start time: 12/13/2015 1:10 PM End time: 12/13/2015 1:16 PM Injection made incrementally with aspirations every 5 mL.  Performed by: Personally  Anesthesiologist: CREWS, DAVID   Procedure Name: LMA Insertion Date/Time: 12/13/2015 2:04 PM Performed by: Melynda Ripple D Pre-anesthesia Checklist: Patient identified, Emergency Drugs available, Suction available and Patient being monitored Patient Re-evaluated:Patient Re-evaluated prior to inductionOxygen Delivery Method: Circle System Utilized Preoxygenation: Pre-oxygenation with 100% oxygen Intubation Type: IV induction Ventilation: Mask ventilation without difficulty LMA: LMA inserted LMA Size: 4.0 Number of attempts: 1 Airway Equipment and Method: Bite block Placement Confirmation: positive ETCO2 Tube secured with: Tape Dental Injury: Teeth and Oropharynx as per pre-operative assessment       Left PEC block image

## 2015-12-13 NOTE — Progress Notes (Signed)
Assisted Dr. Al Corpus with left, ultrasound guided, pectoralis block also assisted Lyman (nuc med) with nuc med inj. Side rails up, monitors on throughout procedure. See vital signs in flow sheet. Tolerated Procedure well.

## 2015-12-13 NOTE — Anesthesia Preprocedure Evaluation (Signed)
Anesthesia Evaluation  Patient identified by MRN, date of birth, ID band Patient awake    Reviewed: Allergy & Precautions, NPO status , Patient's Chart, lab work & pertinent test results  Airway Mallampati: I  TM Distance: >3 FB Neck ROM: Full    Dental  (+) Upper Dentures, Teeth Intact, Dental Advisory Given,    Pulmonary Current Smoker,    breath sounds clear to auscultation       Cardiovascular hypertension, Pt. on medications and Pt. on home beta blockers + angina (No angina currently) with exertion + CAD, + Past MI and +CHF   Rhythm:Regular Rate:Normal     Neuro/Psych    GI/Hepatic   Endo/Other  diabetes, Well Controlled, Type 2  Renal/GU      Musculoskeletal   Abdominal   Peds  Hematology   Anesthesia Other Findings   Reproductive/Obstetrics                             Anesthesia Physical Anesthesia Plan  ASA: III  Anesthesia Plan: General   Post-op Pain Management: MAC Combined w/ Regional for Post-op pain   Induction: Intravenous  Airway Management Planned: LMA  Additional Equipment:   Intra-op Plan:   Post-operative Plan: Extubation in OR  Informed Consent: I have reviewed the patients History and Physical, chart, labs and discussed the procedure including the risks, benefits and alternatives for the proposed anesthesia with the patient or authorized representative who has indicated his/her understanding and acceptance.   Dental advisory given  Plan Discussed with: CRNA, Anesthesiologist and Surgeon  Anesthesia Plan Comments:         Anesthesia Quick Evaluation

## 2015-12-13 NOTE — Op Note (Signed)
NAME:  Kenneth [name], Kenneth [name] NO.:  [id number]  MEDICAL RECORD NO.:  [id number]  LOCATION:  [facility]                          FACILITY:  [facility]  PHYSICIAN:  Fenton Malling. Lucia Gaskins, M.D.  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  PROCEDURE:  Wide excision of left arm melanoma, left axillary sentinel lymph node biopsy x 2.  SURGEON:  Fenton Malling. Lucia Gaskins, M.D.  FIRST ASSISTANT:  None.  ANESTHESIA:  General endotracheal, supervised by Dr. Everlean Cherry. Denenny.  ESTIMATED BLOOD LOSS:  Less than 50 mL.  Local anesthetic was 20 mL of 0.25% Marcaine.  SPECIMEN: 1. Left axillary sentinel lymph node x2.  Both were hot but not blue. 2. Left wide excision of left arm melanoma with a long suture thenar, short suture cranial.  INDICATION FOR PROCEDURE:  [name] is a 70 year old white male whose primary care physician is Dr. Velna Hatchet.  He had a lesion of his left forearm which was biopsied by Dr. Hilarie Fredrickson. Allyson Sabal that showed a melanoma, nodular type, Clark's level 4, Breslow depth 3.8 mm with ulceration in the base involved with tumor.  This was a T3b tumor. I discussed with him about a wider excision of the melanoma and a sentinel lymph node biopsy.  He underwent lymphoscintigraphy whose report showed a possible interval axillary node in his left upper arm with two axillary lymph nodes.  He now comes for wider excision of his left forearm melanoma and axillary lymph node biopsy.  The indications and risks of surgery were explained to the patient. Potential risks include bleeding, infection, nerve injury, and lymphedema of his arm, also the need for further surgery.  OPERATIVE NOTE:   The patient was taken to the room #6 at [facility].  His left  arm and axilla were prepped with ChloraPrep and sterilely draped.  This was under general anesthesia.    A time-out was held, and surgical checklist run.  I injected about 1 mL of 40% methylene blue in 4 spots around the healing ulcer of his left forearm.  I then started with the left axillary dissection.  I could find 2 areas of nodes which were hot.  I made a midaxillary incision, cut into the axillary fat, and I found an anterior lymph node and a more posterior lymph node, each with counts between about 50 and 60 with a background of 0.  Neither lymph node was blue. Each node appeared to be about 5 or 6 mm in size.  So, these nodes were sent together.  I irrigated the wound out with saline.  I closed the deep layers with 3-0 Vicryl suture, the skin with a 4-0 Monocryl suture and will paint this with LiquiBand at the end of the case.  I then went over the lesion on his left arm.  This was on the thenar side in his antecubital area.  I tried to excise an area about 1.5 cm wide  of the scar on both sides.  The total incision was approximately 9 x 4 cm.  I went down to the fascia.  I took the thenar basilic vein with a specimen taken this off the muscle.  I marked a long suture on the thenar side, a short suture on the cranial side, and sent this to Pathology.  I then irrigated the wound, I freed up the subcutaneous fat to pull the skin together better.  I then put 3- 0 Vicryl sutures in the subcutaneous sutures, and used interrupted 2-0 nylon sutures in the skin because there was a fair amount of tension, and I was able to straighten his arm again with some tension in the skin.  After the completion, the wound came together pretty well.  I then placed 4 x 4 gauze on the wound, wrapped it with a Kerlix, and wrapped with an Ace and had Dermabond placed on the left axillary wound.  The patient tolerated the procedure well, was transported to the recovery room in good condition.  Sponge and needle count were  correct at the end of the case.  I will let him restart his Plavix back in 2 days postop if he is doing well.  I have discussed this with the patient.   avid H. Lucia Gaskins, M.D., [facility], scribe for Epic   DHN/MEDQ  D:  12/13/2015  T:  12/13/2015  Job:  [id number]  cc:   Dr. Velna Hatchet Dr. Hilarie Fredrickson. Allyson Sabal

## 2015-12-13 NOTE — Interval H&P Note (Signed)
History and Physical Interval Note:  12/13/2015 1:51 PM  Kenneth [name]  has presented today for surgery, with the diagnosis of left arm melanoma  The various methods of treatment have been discussed with the patient and family.  Sister and brother in law in room.  His sister had a melanoma of the back of her right arm in the 1990's.  After consideration of risks, benefits and other options for treatment, the patient has consented to  Procedure(s): WIDE EXCISION LEFT ARM  MELANOMA WITH LEFT AXILLARY  SENTINEL LYMPH NODE  (Left) as a surgical intervention .  The patient's history has been reviewed, patient examined, no change in status, stable for surgery.  I have reviewed the patient's chart and labs.  Questions were answered to the patient's satisfaction.     Beverlie Kurihara H

## 2015-12-14 ENCOUNTER — Encounter (HOSPITAL_BASED_OUTPATIENT_CLINIC_OR_DEPARTMENT_OTHER): Payer: Self-pay | Admitting: Surgery

## 2015-12-15 ENCOUNTER — Ambulatory Visit (INDEPENDENT_AMBULATORY_CARE_PROVIDER_SITE_OTHER): Payer: PPO | Admitting: Cardiology

## 2015-12-15 ENCOUNTER — Encounter: Payer: Self-pay | Admitting: Cardiology

## 2015-12-15 VITALS — BP 152/86 | HR 74 | Wt 211.8 lb

## 2015-12-15 DIAGNOSIS — I5042 Chronic combined systolic (congestive) and diastolic (congestive) heart failure: Secondary | ICD-10-CM

## 2015-12-15 DIAGNOSIS — I2510 Atherosclerotic heart disease of native coronary artery without angina pectoris: Secondary | ICD-10-CM | POA: Diagnosis not present

## 2015-12-15 DIAGNOSIS — Z9861 Coronary angioplasty status: Secondary | ICD-10-CM

## 2015-12-15 DIAGNOSIS — E785 Hyperlipidemia, unspecified: Secondary | ICD-10-CM

## 2015-12-15 DIAGNOSIS — I10 Essential (primary) hypertension: Secondary | ICD-10-CM | POA: Diagnosis not present

## 2015-12-15 NOTE — Progress Notes (Signed)
CARDIOLOGY OFFICE NOTE  Date:  12/15/2015    Kenneth [name] Date of Birth: 1946-02-20 Medical Record [id number]  PCP:  Velna Hatchet, MD  Cardiologist:  [place]  Chief Complaint  Patient presents with  . Follow-up    states no chest pain, or dizziness  . Shortness of Breath    pt states he was SOB in the A.M.  . Edema    lower legs      History of Present Illness: Kenneth [name] is a 70 y.o. male is seen for follow up CAD and CHF.  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 Other issues include HTN, HLD, tobacco abuse, statin intolerance and DM.  Echo in 3/16 showed EF 45% with wall motion abnormalities and moderate-severe  MR. Rght and left heart cath in 3/16 showing nonobstructive CAD (patent stents) and normal filling pressures. MR looked mild to moderate by LV-gram.   On 02/10/15 he was admitted with acute respiratory failure and acute pulmonary edema. BP was very high initially, SBP > 200. He was started on NTG gtt. CXR with pulmonary edema. He was diuresed.Prior Echo was  reviewed by Dr. Aundra Dubin due to concern for MR - the MR was eccentric and likely ischemic (related to wall motion abnormalities with posterior leaflet tethering). Rated moderate to severe with PISA ERO 0.39 cm^2. It was Dr. Claris Gladden suspicion that the MR actually is moderate based on the recent cath. It was possibly felt that rise in BP (such as with pulmonary edema on admission) can make the MR worse and potentiate CHF. Repeat Echo was done on 02/11/15 with mild to moderate MR and EF 55%. He was diuresed and placed on Imdur. Coreg and lisinopril were titrated.  He was readmitted in July 2016 with an episode of chest pain. He ruled out for MI. Repeat cardiac cath 05/15/15 showed nonobstructive disease. Prior stents patent. FFR of distal LAD moderate lesion was 0.82. He had one episode of NSVT 18 beats. Beta blocker was increased. Echo showed EF  40-45%.   On last visit in November 2016 he had a syncopal episode. Event monitor was unremarkable. No further syncope.  On follow up today he is doing well from a cardiac standpoint. He underwent excision of malignant melanoma on his left arm with sentinel node biopsy. Path is pending. Lasix was held for 2 days. Plavix held and resumed today. He has noted increased dyspnea last night and swelling in ankles. Weight is up 5 lbs.   Past Medical History  Diagnosis Date  . Hypertension   . Hyperlipidemia     a. Statin intolerant, on fibrate.  . Tobacco abuse   . CAD S/P percutaneous coronary angioplasty 1999; 2014    a. 1999: s/p PTCA;  b. 11/2012 Cath/PCI: s/p DES to RCA, EF 40% inf HK;  c. 12/2012 Staged PCI of LAD w/ 3.0x12 Promus Premier DES. d. Cath 3/16: patent stents, EF 35-40%. e. 04/2015: cath stable - patent stents, nonobstructive CAD, borderline stenosis in the mid-distal LAD with FFR 0.82 (intermediate).  . Chronic diastolic CHF (congestive heart failure) ([place]) 09/18/2013  . H/O seasonal allergies   . Elevated uric acid in blood   . Osteoarthritis     "-knees, hands, wrist, fingers" (05/11/2015)  . History of gout   . Depression 1990's    "stopped RX after having suicidal thoughts" (05/11/2015)  . PTSD (post-traumatic stress disorder)     "[name]"  .  LV dysfunction     a. Varying EF from 35-40%, 40-45% on prior studies.  . Colon polyps 12/02/2013    Tubular adenoma-2, Hyperplastic polyps-3  . Myocardial infarction [facility])     '99- MI,"angioplasty", Stents 2& 3'14  . Type II diabetes mellitus ([place])     "A1C fine since 2012" (05/11/2015), no meds now  . Squamous cell carcinoma (HCC)     Back  . Melanoma ([place])     left arm    Past Surgical History  Procedure Laterality Date  . Carpal tunnel release Left 2000's  . Ankle reconstruction Right 1960    "had to break then rebuild"  . Total knee arthroplasty Right 06/08/2014    Procedure: RIGHT TOTAL KNEE ARTHROPLASTY;  Surgeon:  Gearlean Alf, MD;  Location: [facility] ORS;  Service: Orthopedics;  Laterality: Right;  . Left heart catheterization with coronary angiogram N/A 12/27/2012    Procedure: LEFT HEART CATHETERIZATION WITH CORONARY ANGIOGRAM;  Surgeon: Jolaine Artist, MD;  Location: [facility] CATH LAB;  Service: Cardiovascular;  LM mild plaque, LAD 60-43m, LCX 30p, 109m, OM1 12m, RCA 30-40ost, 40-35m, 80-65m/d , 70-80d, EF 40%  . Percutaneous coronary stent intervention (pci-s) N/A 12/30/2012    Procedure: PERCUTANEOUS CORONARY STENT INTERVENTION (PCI-S) - RCA;  Surgeon: Isabellamarie Randa M Martinique, MD;  Location: [facility] CATH LAB;  Service: Cardiovascular; 3.0x38, 3.0x32, 3.0x24 Promus Premier DESs   . Percutaneous coronary stent intervention (pci-s) N/A 01/14/2013    PERCUTANEOUS CORONARY STENT INTERVENTION (PCI-S);  Surgeon: Lacresha Fusilier M Martinique, MD;  Location: [facility] CATH LAB;  Service: Cardiovascular;  Staged PCI of LAD w/ 3.0x12 Promus Premier DES.  Marland Kitchen Left and right heart catheterization with coronary angiogram N/A 01/12/2015    LEFT AND RIGHT HEART CATHETERIZATION WITH CORONARY ANGIOGRAM;  Surgeon: Lamarius Dirr M Martinique, MD;  Location: [facility] CATH LAB;  Service: Cardiovascular;  Normal Right Heart Pressures. CO/CI (Fick) 5.27 L/min, 2.6 L/min/m2; LAD: mod Ca2+, pLAD 30%, mLAD Stentwidely patent\, m-dLAD ~40%;Cx:2 OMB, terminates as large OM3.  30% small OM1 & m-d Cx;Large, dominant. Prox, mid & distal stents patent ; EF ~35-40%  . Transthoracic echocardiogram  3/15/'216; 4/14/'16    a. 3/'16: EF 45%, basal to mid inferior akinesis and inferolateral to anterolateral hypokinesis. Grade 2 DD. 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 4/'16: EF 55% with basal inferior and mild posterior HK, Mild LVH. Mild-mod MR. Mild LA and RA dilation   . Inguinal hernia repair Right 2004 ?    w/mesh  . Joint replacement    . Coronary angioplasty  1999  . Coronary angioplasty with stent placement    . Squamous cell carcinoma excision  ~ 1997    "back"  .  Esophagogastroduodenoscopy N/A 05/13/2015    Procedure: ESOPHAGOGASTRODUODENOSCOPY (EGD);  Surgeon: Jerene Bears, MD;  Location: [facility] ENDOSCOPY;  Service: Endoscopy;  Laterality: N/A;  . Cardiac catheterization N/A 05/14/2015    Procedure: Left Heart Cath and Coronary Angiography;  Surgeon: Masato Pettie M Martinique, MD;  Location: [place] CV LAB;  Service: Cardiovascular;  Laterality: N/A;  . Excision melanoma with sentinel lymph node biopsy Left 12/13/2015    Procedure: WIDE EXCISION LEFT ARM  MELANOMA WITH LEFT AXILLARY  SENTINEL LYMPH NODE ;  Surgeon: Alphonsa Overall, MD;  Location: [place];  Service: General;  Laterality: Left;     Medications: Current Outpatient Prescriptions  Medication Sig Dispense Refill  . allopurinol (ZYLOPRIM) 300 MG tablet Take 300 mg by mouth daily.    Marland Kitchen aspirin  EC 81 MG tablet Take 81 mg by mouth daily.    . carvedilol (COREG) 6.25 MG tablet Take 1 tablet (6.25 mg total) by mouth 2 (two) times daily with a meal. 60 tablet 11  . cetirizine (ZYRTEC) 10 MG tablet Take 10 mg by mouth daily as needed for allergies. Reported on 11/03/2015    . Cholecalciferol (VITAMIN D3) 2000 UNITS TABS Take by mouth.    . clopidogrel (PLAVIX) 75 MG tablet Take 75 mg by mouth daily.     . colchicine 0.6 MG tablet Take 0.6 mg by mouth daily as needed (Gout). Reported on 11/03/2015    . escitalopram (LEXAPRO) 10 MG tablet Take 10 mg by mouth daily. Take 1-2 tablets in the evening.  3  . fluticasone (FLONASE) 50 MCG/ACT nasal spray Place 2 sprays into the nose daily as needed for allergies.     . furosemide (LASIX) 40 MG tablet Take 1 tablet (40 mg total) by mouth daily.    Marland Kitchen HYDROcodone-acetaminophen (NORCO/VICODIN) 5-325 MG tablet Take 1-2 tablets by mouth every 6 (six) hours as needed. 30 tablet 0  . isosorbide mononitrate (IMDUR) 30 MG 24 hr tablet TAKE 1 TABLET (30 MG TOTAL) BY MOUTH 2 (TWO) TIMES DAILY. 60 tablet 11  . lisinopril (PRINIVIL,ZESTRIL) 40 MG tablet Take 20 mg by mouth  every morning.    . Multiple Vitamin (MULTIVITAMIN) tablet Take 1 tablet by mouth daily.    . nitroGLYCERIN (NITROSTAT) 0.4 MG SL tablet Place 1 tablet (0.4 mg total) under the tongue every 5 (five) minutes x 3 doses as needed for chest pain. (Patient not taking: Reported on 11/03/2015) 25 tablet 2  . Probiotic Product (PROBIOTIC DAILY PO) Take 1 capsule by mouth daily.    Marland Kitchen tetrahydrozoline 0.05 % ophthalmic solution Place 1 drop into both eyes 2 (two) times daily as needed (Itchy eyes).     No current facility-administered medications for this visit.    Allergies: Allergies  Allergen Reactions  . Crestor [Rosuvastatin]     Muscle aches at Crestor 5 mg daily  . Lipitor [Atorvastatin]     Muscle aches at 20 mg daily   . Pravastatin Sodium     Muscle aches at 20 mg and 40 mg daily  . Zocor [Simvastatin-High Dose]     Muscle aches at 20 mg and 40 mg daily    Social History: The patient  reports that he has been smoking Cigars.  He has never used smokeless tobacco. He reports that he drinks about 3.6 oz of alcohol per week. He reports that he does not use illicit drugs.   Family History: The patient's family history includes Alzheimer's disease in his mother; Arrhythmia in his sister; CVA in his father; Diabetes in his mother; Hypertension in his mother. There is no history of Colon cancer.   Review of Systems: Please see the history of present illness.  He does note that he has been using medical marijuana for years for PTSD and arthritis.  All other systems are reviewed and negative.   Physical Exam: VS:  BP 152/86 mmHg  Pulse 74  Wt 96.072 kg (211 lb 12.8 oz) .  BMI Body mass index is 31.26 kg/(m^2).   Wt Readings from Last 3 Encounters:  12/15/15 96.072 kg (211 lb 12.8 oz)  12/13/15 93.441 kg (206 lb)  11/03/15 91.627 kg (202 lb)    General: Pleasant. Well developed, well nourished, NAD HEENT: Normal. Neck: Supple, no JVD, carotid bruits, or masses noted.  Cardiac: Regular  rate and rhythm. No murmur heard on auscultation. Tr edema.  Respiratory:  Lungs are clear to auscultation bilaterally with normal work of breathing.  GI: Soft and nontender.  MS: No deformity or atrophy. Gait and ROM intact. Skin: Warm and dry. Color is normal.  Neuro:  Strength and sensation are intact and no gross focal deficits noted.  Psych: Alert, appropriate and with normal affect.   LABORATORY DATA:  EKG:  Not done today.  Lab Results  Component Value Date   WBC 8.2 10/12/2015   HGB 13.8 10/12/2015   HCT 40.5 10/12/2015   PLT 188 10/12/2015   GLUCOSE 111* 12/08/2015   CHOL 188 01/09/2015   TRIG 199* 01/09/2015   HDL 31* 01/09/2015   LDLCALC 117* 01/09/2015   ALT 47 02/10/2015   AST 37 02/10/2015   NA 141 12/08/2015   K 4.7 12/08/2015   CL 103 12/08/2015   CREATININE 1.07 12/08/2015   BUN 22* 12/08/2015   CO2 27 12/08/2015   TSH 1.840 02/10/2015   INR 1.26 05/14/2015   HGBA1C 5.9* 02/10/2015   BNP (last 3 results)  Recent Labs  02/09/15 2335 05/11/15 0227  BNP 770.9* 595.6*    ProBNP (last 3 results)  Recent Labs  02/23/15 1209  PROBNP 246.0*     Other Studies Reviewed Today:  2D Echo 05/31/15 Study Conclusions  - Left ventricle: The cavity size was normal. There was mild concentric hypertrophy. Systolic function was mildly to moderately reduced. The estimated ejection fraction was in the range of 40% to 45%. There is akinesis of the basal-midinferior myocardium. Features are consistent with a pseudonormal left ventricular filling pattern, with concomitant abnormal relaxation and increased filling pressure (grade 2 diastolic dysfunction). - Aortic valve: Trileaflet; normal thickness, mildly calcified leaflets. - Mitral valve: Calcified annulus. Mildly thickened leaflets . There was mild regurgitation directed eccentrically and posteriorly. - Left atrium: The atrium was severely dilated. - Pulmonic valve: There was trivial  regurgitation.  Cardiac cath: 05/14/15: Conclusion     A drug-eluting stent was placed.  Prox RCA lesion, 20% stenosed.  RPDA lesion, 20% stenosed.  1st Mrg lesion, 30% stenosed.  Dist Cx lesion, 30% stenosed.  Ost 2nd Mrg to 2nd Mrg lesion, 30% stenosed.  Prox LAD lesion, 40% stenosed.  A drug-eluting stent was placed.  Dist LAD lesion, 55% stenosed.  1. Nonobstructive CAD. Multiple prior stents are still patent. No change from March 2016. 2. Borderline stenosis in the mid- distal LAD. This lesion is unchanged from prior studies. FFR is 0.82 in an intermediate range. Based on these findings I do not feel this lesion explains his resting chest pain. 3. Normal LV EDP.   Plan: continue medical therapy with beta blocker, nitrates and PPI. Continue long term DAPT. There appears to be an anxiety component to his symptoms- will give lorazepam prn. Consider a SSRI if symptoms persist     Assessment/Plan:  1.  Chronic Diastolic & Systolic CHF: H/o Ischemic cardiomyopathy with prior EF of 45% by echo. Repeat echo July showed EF 40-45%.  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  Volume overloaded today. Lasix held recently for surgery. Needs to take lasix twice a day until weight back to baselin.  Encouraged to stay on low sodium diet. Keep weighing daily.  2. CAD: admitted with chest pain in July. LHC showed patent stents.  Nonobstructive CAD.  Continue ASA 81 and Plavix. Does not  tolerate statins. Continue current therapy.  3. Mitral regurgitation: Probably ischemic. Moderate.   4. HTN: elevated today with volume overload. Will monitor response to diuretics.   5. HLD - statin intolerant. Unable to afford Welchol.  6. Syncope. No recurrence. Event monitor unremarkable.   7. Malignant melanoma. Pathology pending from surgery.  Current medicines are reviewed with the patient today.  The patient does not have  concerns regarding medicines other than what has been noted above.  The following changes have been made:  See above.  Labs/ tests ordered today include: none  No orders of the defined types were placed in this encounter.     Disposition:   FU with Dr. Martinique in 4 months.   Khrystina Bonnes [place] MD, [facility]

## 2015-12-15 NOTE — Patient Instructions (Signed)
Increase your lasix to twice a day until weight is back to baseline then reduce back to once a day  Continue your other therapy  I will see you in 4 months.

## 2016-01-12 ENCOUNTER — Ambulatory Visit (HOSPITAL_BASED_OUTPATIENT_CLINIC_OR_DEPARTMENT_OTHER): Payer: PPO | Admitting: Hematology & Oncology

## 2016-01-12 ENCOUNTER — Other Ambulatory Visit (HOSPITAL_BASED_OUTPATIENT_CLINIC_OR_DEPARTMENT_OTHER): Payer: PPO

## 2016-01-12 ENCOUNTER — Encounter: Payer: Self-pay | Admitting: Hematology & Oncology

## 2016-01-12 ENCOUNTER — Ambulatory Visit (HOSPITAL_BASED_OUTPATIENT_CLINIC_OR_DEPARTMENT_OTHER): Payer: PPO

## 2016-01-12 VITALS — BP 165/88 | HR 71 | Temp 97.8°F | Resp 18 | Ht 69.0 in | Wt 213.0 lb

## 2016-01-12 DIAGNOSIS — C4362 Malignant melanoma of left upper limb, including shoulder: Secondary | ICD-10-CM

## 2016-01-12 LAB — COMPREHENSIVE METABOLIC PANEL
ALBUMIN: 3.6 g/dL (ref 3.5–5.0)
ALK PHOS: 95 U/L (ref 40–150)
ALT: 22 U/L (ref 0–55)
AST: 20 U/L (ref 5–34)
Anion Gap: 9 mEq/L (ref 3–11)
BUN: 16 mg/dL (ref 7.0–26.0)
CO2: 28 mEq/L (ref 22–29)
Calcium: 9.2 mg/dL (ref 8.4–10.4)
Chloride: 105 mEq/L (ref 98–109)
Creatinine: 1 mg/dL (ref 0.7–1.3)
EGFR: 77 mL/min/{1.73_m2} — ABNORMAL LOW (ref >90)
GLUCOSE: 113 mg/dL (ref 70–140)
POTASSIUM: 3.8 meq/L (ref 3.5–5.1)
SODIUM: 141 meq/L (ref 136–145)
Total Bilirubin: 0.83 mg/dL (ref 0.20–1.20)
Total Protein: 7.4 g/dL (ref 6.4–8.3)

## 2016-01-12 LAB — CBC WITH DIFFERENTIAL (CANCER CENTER ONLY)
BASO#: 0 10*3/uL (ref 0.0–0.2)
BASO%: 0.5 % (ref 0.0–2.0)
EOS%: 2.4 % (ref 0.0–7.0)
Eosinophils Absolute: 0.1 10*3/uL (ref 0.0–0.5)
HEMATOCRIT: 32.5 % — AB (ref 38.7–49.9)
HGB: 11.2 g/dL — ABNORMAL LOW (ref 13.0–17.1)
LYMPH#: 1.1 10*3/uL (ref 0.9–3.3)
LYMPH%: 18.8 % (ref 14.0–48.0)
MCH: 33 pg (ref 28.0–33.4)
MCHC: 34.5 g/dL (ref 32.0–35.9)
MCV: 96 fL (ref 82–98)
MONO#: 0.5 10*3/uL (ref 0.1–0.9)
MONO%: 9.2 % (ref 0.0–13.0)
NEUT#: 4 10*3/uL (ref 1.5–6.5)
NEUT%: 69.1 % (ref 40.0–80.0)
PLATELETS: 195 10*3/uL (ref 145–400)
RBC: 3.39 10*6/uL — ABNORMAL LOW (ref 4.20–5.70)
RDW: 14.7 % (ref 11.1–15.7)
WBC: 5.7 10*3/uL (ref 4.0–10.0)

## 2016-01-12 LAB — LACTATE DEHYDROGENASE: LDH: 205 U/L (ref 125–245)

## 2016-01-12 NOTE — Progress Notes (Signed)
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 OTHER    New Patient  :  I'm here because I had melanoma.  HPI:  Kenneth [name] is a really nice 70 year old white male. He is originally from [place]. He really has had a interesting life. 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 [employer] he has been in [place] for a few years.  He began to note a skin lesion on his left forearm, years ago. This was near his left elbow. It did not really bother him. However, for the past 6 months or so, began to note that the lesion became a little more nodular and irritated.  He went to see Dr. Allyson Sabal of  Dermatology. A punch biopsy was done.  The initial pathology report showed a melanoma that was nodular. It had a Breslow's depth  Of 3.8 no meters. He was Clark level IV.  He was then referred to Dr. Lucia Gaskins of general surgery.  Kenneth [name] does have cardiomyopathy area and he sees Dr. S Martinique for this.   Dr. Lucia Gaskins went ahead and did a wide local excision and left axillary lymph node biopsy in February on February 13. The pathology report ([id number]) showed a 3.8 mm melanoma. No residual disease was noted. Heart is were free. 2 lymph nodes were all negative. There was some ulceration. This measured 2 mm wide. There is no lymphovascular invasion. Tumor regression was not noted.  As such, he is a stage IIb (T3bN0M0) nodular melanoma.  He was kindly refer to the [employer] for an evaluation.   He feels good. He has recovered very nicely from his surgery. He has little bit of stiffness in the left arm. He has no arm swelling.  He's had no nausea vomiting. There is no change in bowel or bladder habits.   Of note, he did have multiple sunburns when he was younger.  He was in the [employer]. He does smoke. He does have an occasional drink.  I think  a sister of his had melanoma.  Overall, his performance status is ECOG 0.   Past Medical History  Diagnosis Date  . Hypertension   . Hyperlipidemia     a. Statin intolerant, on fibrate.  . Tobacco abuse   . CAD S/P percutaneous coronary angioplasty 1999; 2014    a. 1999: s/p PTCA;  b. 11/2012 Cath/PCI: s/p DES to RCA, EF 40% inf HK;  c. 12/2012 Staged PCI of LAD w/ 3.0x12 Promus Premier DES. d. Cath 3/16: patent stents, EF 35-40%. e. 04/2015: cath stable - patent stents, nonobstructive CAD, borderline stenosis in the mid-distal LAD with FFR 0.82 (intermediate).  . Chronic diastolic CHF (congestive heart failure) ([place]) 09/18/2013  . H/O seasonal allergies   . Elevated uric acid in blood   . Osteoarthritis     "-knees, hands, wrist, fingers" (05/11/2015)  . History of gout   . Depression 1990's    "stopped RX after having suicidal thoughts" (05/11/2015)  . PTSD (post-traumatic stress disorder)     "[name]"  . LV dysfunction     a. Varying EF from 35-40%, 40-45% on prior studies.  . Colon polyps 12/02/2013    Tubular adenoma-2, Hyperplastic polyps-3  . Myocardial infarction [facility])     '99- MI,"angioplasty", Stents 2& 3'14  . Type II diabetes  mellitus ([place])     "A1C fine since 2012" (05/11/2015), no meds now  . Squamous cell carcinoma (HCC)     Back  . Melanoma ([place])     left arm  :  Past Surgical History  Procedure Laterality Date  . Carpal tunnel release Left 2000's  . Ankle reconstruction Right 1960    "had to break then rebuild"  . Total knee arthroplasty Right 06/08/2014    Procedure: RIGHT TOTAL KNEE ARTHROPLASTY;  Surgeon: Gearlean Alf, MD;  Location: [facility] ORS;  Service: Orthopedics;  Laterality: Right;  . Left heart catheterization with coronary angiogram N/A 12/27/2012    Procedure: LEFT HEART CATHETERIZATION WITH CORONARY ANGIOGRAM;  Surgeon: Jolaine Artist, MD;  Location: [facility] CATH LAB;  Service: Cardiovascular;  LM mild plaque, LAD 60-57m LCX 30p, 492mOM1 4010mCA  30-40ost, 40-57m19m-56m/4m70-80d, EF 40%  . Percutaneous coronary stent intervention (pci-s) N/A 12/30/2012    Procedure: PERCUTANEOUS CORONARY STENT INTERVENTION (PCI-S) - RCA;  Surgeon: Peter M JordaMartinique  [facility];  Service: Cardiovascular; 3.0x38, 3.0x32, 3.0x24 Promus Premier DESs   . Percutaneous coronary stent intervention (pci-s) N/A 01/14/2013    PERCUTANEOUS CORONARY STENT INTERVENTION (PCI-S);  Surgeon: Peter M JordaMartinique  [facility];  Service: Cardiovascular;  Staged PCI of LAD w/ 3.0x12 Promus Premier DES.  . LefMarland Kitchen and right heart catheterization with coronary angiogram N/A 01/12/2015    LEFT AND RIGHT HEART CATHETERIZATION WITH CORONARY ANGIOGRAM;  Surgeon: Peter M JordaMartinique  [facility];  Service: Cardiovascular;  Normal Right Heart Pressures. CO/CI (Fick) 5.27 L/min, 2.6 L/min/m2; LAD: mod Ca2+, pLAD 30%, mLAD Stentwidely patent\, m-dLAD ~40%;Cx:2 OMB, terminates as large OM3.  30% small OM1 & m-d Cx;Large, dominant. Prox, mid & distal stents patent ; EF ~35-40%  . Transthoracic echocardiogram  3/15/'216; 4/14/'16    a. 3/'16: EF 45%, basal to mid inferior akinesis and inferolateral to anterolateral hypokinesis. Grade 2 DD. 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 4/'16: EF 55% with basal inferior and mild posterior HK, Mild LVH. Mild-mod MR. Mild LA and RA dilation   . Inguinal hernia repair Right 2004 ?    w/mesh  . Joint replacement    . Coronary angioplasty  1999  . Coronary angioplasty with stent placement    . Squamous cell carcinoma excision  ~ 1997    "back"  . Esophagogastroduodenoscopy N/A 05/13/2015    Procedure: ESOPHAGOGASTRODUODENOSCOPY (EGD);  Surgeon: Jay MJerene Bears  [facility];  Service: Endoscopy;  Laterality: N/A;  . Cardiac catheterization N/A 05/14/2015    Procedure: Left Heart Cath and Coronary Angiography;  Surgeon: Peter M JordaMartinique  [facility];  Service:  Cardiovascular;  Laterality: N/A;  . Excision melanoma with sentinel lymph node biopsy Left 12/13/2015    Procedure: WIDE EXCISION LEFT ARM  MELANOMA WITH LEFT AXILLARY  SENTINEL LYMPH NODE ;  Surgeon: DavidAlphonsa Overall  [facility];  Laterality: Left;  :   Current outpatient prescriptions:  .  allopurinol (ZYLOPRIM) 300 MG tablet, Take 300 mg by mouth daily., Disp: , Rfl:  .  aspirin EC 81 MG tablet, Take 81 mg by mouth daily., Disp: , Rfl:  .  carvedilol (COREG) 6.25 MG tablet, Take 1 tablet (6.25 mg total) by mouth 2 (two) times daily with a meal., Disp: 60 tablet, Rfl: 11 .  cetirizine (ZYRTEC) 10 MG tablet, Take  10 mg by mouth daily as needed for allergies. Reported on 11/03/2015, Disp: , Rfl:  .  Cholecalciferol (VITAMIN D3) 2000 UNITS TABS, Take by mouth., Disp: , Rfl:  .  clopidogrel (PLAVIX) 75 MG tablet, Take 75 mg by mouth daily. , Disp: , Rfl:  .  colchicine 0.6 MG tablet, Take 0.6 mg by mouth daily as needed (Gout). Reported on 11/03/2015, Disp: , Rfl:  .  escitalopram (LEXAPRO) 10 MG tablet, Take 10 mg by mouth daily. Take 1-2 tablets in the evening., Disp: , Rfl: 3 .  fluticasone (FLONASE) 50 MCG/ACT nasal spray, Place 2 sprays into the nose daily as needed for allergies. , Disp: , Rfl:  .  furosemide (LASIX) 40 MG tablet, Take 1 tablet (40 mg total) by mouth daily., Disp: , Rfl:  .  HYDROcodone-acetaminophen (NORCO/VICODIN) 5-325 MG tablet, Take 1-2 tablets by mouth every 6 (six) hours as needed., Disp: 30 tablet, Rfl: 0 .  isosorbide mononitrate (IMDUR) 30 MG 24 hr tablet, TAKE 1 TABLET (30 MG TOTAL) BY MOUTH 2 (TWO) TIMES DAILY., Disp: 60 tablet, Rfl: 11 .  lisinopril (PRINIVIL,ZESTRIL) 40 MG tablet, Take 20 mg by mouth every morning., Disp: , Rfl:  .  Multiple Vitamin (MULTIVITAMIN) tablet, Take 1 tablet by mouth daily., Disp: , Rfl:  .  nitroGLYCERIN (NITROSTAT) 0.4 MG SL tablet, Place 1 tablet (0.4 mg total) under the tongue every 5  (five) minutes x 3 doses as needed for chest pain., Disp: 25 tablet, Rfl: 2 .  Probiotic Product (PROBIOTIC DAILY PO), Take 1 capsule by mouth daily., Disp: , Rfl:  .  tetrahydrozoline 0.05 % ophthalmic solution, Place 1 drop into both eyes 2 (two) times daily as needed (Itchy eyes)., Disp: , Rfl: :  :  Allergies  Allergen Reactions  . Crestor [Rosuvastatin]     Muscle aches at Crestor 5 mg daily  . Lipitor [Atorvastatin]     Muscle aches at 20 mg daily   . Pravastatin Sodium     Muscle aches at 20 mg and 40 mg daily  . Zocor [Simvastatin-High Dose]     Muscle aches at 20 mg and 40 mg daily  :  Family History  Problem Relation Age of Onset  . Arrhythmia Sister     s/p PPM  . Alzheimer's disease Mother   . Hypertension Mother   . Diabetes Mother   . CVA Father   . Colon cancer Neg Hx   :  Social History   Social History  . Marital Status: Divorced    Spouse Name: N/A  . Number of Children: 3  . Years of Education: N/A   Occupational History  . Custodian    Social History Main Topics  . Smoking status: Light Tobacco Smoker -- 15 years    Types: Cigars  . Smokeless tobacco: Never Used     Comment: rare ciagar on golf days-variable days 1-2 per month  . Alcohol Use: 3.6 oz/week    6 Cans of beer per week     Comment: social  . Drug Use: No  . Sexual Activity: Not on file     Comment: 05/11/2015 "None of your business"   Other Topics Concern  . Not on file   Social History Narrative   Veteran. Receives [facility].   :  Pertinent items are noted in HPI.  Exam: @IPVITALS @  Well-developed and well-nourished white male in no obvious distress. Vital signs are temperature of 97.8. Pulse 71. Blood  pressure 165/88. Weight is 213 pounds. Head and neck exam shows no ocular or oral lesions. He has no palpable cervical or supraclavicular lymph nodes. Lungs are clear. Cardiac exam regular rate and rhythm with a 1/6 systolic ejection murmur. Abdomen is  soft. He has good bowel sounds. There is no fluid wave. There is no palpable liver or spleen tip. Back exam shows no tenderness over the spine, ribs or hips. Extremities shows no clubbing, cyanosis or edema. Skin exam shows some hyperpigmented lesions throughout his skin. However, nothing appears suspicious. Neurological exam is nonfocal.   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 There is some ulceration which might be considered a negative prognostic factor. There is no for vascular space invasion. 2 lymph nodes were negative.  I really do not see an actual role for adjuvant chemotherapy in his situation. Again, I realized that there is some ulceration. However, I don't think that adjuvant  Immunotherapy has been proven to be of benefit. I think there would be significant toxicity.  I spent about an hour with him today. He is very nice. He is in decent shape. We will certainly have to watch him closely.  I answered all his questions. He certainly do about the pathology report. Dr. Lucia Gaskins did a great job with him.  For now, we will plan to have him come back in about 4 months for follow-up.

## 2016-01-17 DIAGNOSIS — I509 Heart failure, unspecified: Secondary | ICD-10-CM | POA: Diagnosis not present

## 2016-01-17 DIAGNOSIS — R0602 Shortness of breath: Secondary | ICD-10-CM | POA: Diagnosis not present

## 2016-01-17 DIAGNOSIS — I10 Essential (primary) hypertension: Secondary | ICD-10-CM | POA: Diagnosis not present

## 2016-01-17 DIAGNOSIS — I340 Nonrheumatic mitral (valve) insufficiency: Secondary | ICD-10-CM | POA: Diagnosis not present

## 2016-01-17 DIAGNOSIS — Z6834 Body mass index (BMI) 34.0-34.9, adult: Secondary | ICD-10-CM | POA: Diagnosis not present

## 2016-01-19 DIAGNOSIS — I340 Nonrheumatic mitral (valve) insufficiency: Secondary | ICD-10-CM | POA: Diagnosis not present

## 2016-01-19 DIAGNOSIS — Z6832 Body mass index (BMI) 32.0-32.9, adult: Secondary | ICD-10-CM | POA: Diagnosis not present

## 2016-01-19 DIAGNOSIS — I10 Essential (primary) hypertension: Secondary | ICD-10-CM | POA: Diagnosis not present

## 2016-01-19 DIAGNOSIS — R0602 Shortness of breath: Secondary | ICD-10-CM | POA: Diagnosis not present

## 2016-01-19 DIAGNOSIS — I509 Heart failure, unspecified: Secondary | ICD-10-CM | POA: Diagnosis not present

## 2016-01-19 DIAGNOSIS — R0781 Pleurodynia: Secondary | ICD-10-CM | POA: Diagnosis not present

## 2016-01-24 DIAGNOSIS — M25511 Pain in right shoulder: Secondary | ICD-10-CM | POA: Diagnosis not present

## 2016-01-24 DIAGNOSIS — Z6832 Body mass index (BMI) 32.0-32.9, adult: Secondary | ICD-10-CM | POA: Diagnosis not present

## 2016-01-24 DIAGNOSIS — M25512 Pain in left shoulder: Secondary | ICD-10-CM | POA: Diagnosis not present

## 2016-01-25 DIAGNOSIS — S46911A Strain of unspecified muscle, fascia and tendon at shoulder and upper arm level, right arm, initial encounter: Secondary | ICD-10-CM | POA: Diagnosis not present

## 2016-01-25 DIAGNOSIS — S46912A Strain of unspecified muscle, fascia and tendon at shoulder and upper arm level, left arm, initial encounter: Secondary | ICD-10-CM | POA: Diagnosis not present

## 2016-02-09 DIAGNOSIS — G5603 Carpal tunnel syndrome, bilateral upper limbs: Secondary | ICD-10-CM | POA: Diagnosis not present

## 2016-02-09 DIAGNOSIS — S46912D Strain of unspecified muscle, fascia and tendon at shoulder and upper arm level, left arm, subsequent encounter: Secondary | ICD-10-CM | POA: Diagnosis not present

## 2016-02-09 DIAGNOSIS — M25531 Pain in right wrist: Secondary | ICD-10-CM | POA: Diagnosis not present

## 2016-02-09 DIAGNOSIS — M25511 Pain in right shoulder: Secondary | ICD-10-CM | POA: Diagnosis not present

## 2016-02-09 DIAGNOSIS — M25512 Pain in left shoulder: Secondary | ICD-10-CM | POA: Diagnosis not present

## 2016-02-09 DIAGNOSIS — S46911D Strain of unspecified muscle, fascia and tendon at shoulder and upper arm level, right arm, subsequent encounter: Secondary | ICD-10-CM | POA: Diagnosis not present

## 2016-02-21 DIAGNOSIS — R233 Spontaneous ecchymoses: Secondary | ICD-10-CM | POA: Diagnosis not present

## 2016-02-21 DIAGNOSIS — Z6832 Body mass index (BMI) 32.0-32.9, adult: Secondary | ICD-10-CM | POA: Diagnosis not present

## 2016-02-23 DIAGNOSIS — S46911D Strain of unspecified muscle, fascia and tendon at shoulder and upper arm level, right arm, subsequent encounter: Secondary | ICD-10-CM | POA: Diagnosis not present

## 2016-02-28 DIAGNOSIS — S46912D Strain of unspecified muscle, fascia and tendon at shoulder and upper arm level, left arm, subsequent encounter: Secondary | ICD-10-CM | POA: Diagnosis not present

## 2016-02-29 DIAGNOSIS — G5601 Carpal tunnel syndrome, right upper limb: Secondary | ICD-10-CM | POA: Diagnosis not present

## 2016-03-06 ENCOUNTER — Other Ambulatory Visit: Payer: Self-pay | Admitting: Internal Medicine

## 2016-03-06 DIAGNOSIS — M5136 Other intervertebral disc degeneration, lumbar region: Secondary | ICD-10-CM | POA: Diagnosis not present

## 2016-03-06 DIAGNOSIS — K769 Liver disease, unspecified: Secondary | ICD-10-CM | POA: Diagnosis not present

## 2016-03-06 DIAGNOSIS — I10 Essential (primary) hypertension: Secondary | ICD-10-CM | POA: Diagnosis not present

## 2016-03-06 DIAGNOSIS — I25118 Atherosclerotic heart disease of native coronary artery with other forms of angina pectoris: Secondary | ICD-10-CM | POA: Diagnosis not present

## 2016-03-06 DIAGNOSIS — R911 Solitary pulmonary nodule: Secondary | ICD-10-CM

## 2016-03-06 DIAGNOSIS — B372 Candidiasis of skin and nail: Secondary | ICD-10-CM | POA: Diagnosis not present

## 2016-03-06 DIAGNOSIS — M25519 Pain in unspecified shoulder: Secondary | ICD-10-CM | POA: Diagnosis not present

## 2016-03-06 DIAGNOSIS — R918 Other nonspecific abnormal finding of lung field: Secondary | ICD-10-CM | POA: Diagnosis not present

## 2016-03-06 DIAGNOSIS — Z6832 Body mass index (BMI) 32.0-32.9, adult: Secondary | ICD-10-CM | POA: Diagnosis not present

## 2016-03-06 DIAGNOSIS — Z1389 Encounter for screening for other disorder: Secondary | ICD-10-CM | POA: Diagnosis not present

## 2016-03-06 DIAGNOSIS — I509 Heart failure, unspecified: Secondary | ICD-10-CM | POA: Diagnosis not present

## 2016-03-06 DIAGNOSIS — K7689 Other specified diseases of liver: Secondary | ICD-10-CM

## 2016-03-06 DIAGNOSIS — E119 Type 2 diabetes mellitus without complications: Secondary | ICD-10-CM | POA: Diagnosis not present

## 2016-03-06 DIAGNOSIS — C439 Malignant melanoma of skin, unspecified: Secondary | ICD-10-CM | POA: Diagnosis not present

## 2016-03-10 ENCOUNTER — Other Ambulatory Visit: Payer: PPO

## 2016-03-15 ENCOUNTER — Ambulatory Visit
Admission: RE | Admit: 2016-03-15 | Discharge: 2016-03-15 | Disposition: A | Discharge status: Home / Self Care | Payer: PPO | Source: Ambulatory Visit | Attending: Internal Medicine | Admitting: Internal Medicine

## 2016-03-15 DIAGNOSIS — R911 Solitary pulmonary nodule: Secondary | ICD-10-CM | POA: Diagnosis not present

## 2016-03-15 DIAGNOSIS — K7689 Other specified diseases of liver: Secondary | ICD-10-CM

## 2016-03-15 DIAGNOSIS — K862 Cyst of pancreas: Secondary | ICD-10-CM | POA: Diagnosis not present

## 2016-03-15 MED ORDER — IOPAMIDOL (ISOVUE-300) INJECTION 61%
100.0000 mL | Freq: Once | INTRAVENOUS | Status: AC | PRN
  Administered 2016-03-15: 100 mL via INTRAVENOUS

## 2016-03-28 DIAGNOSIS — M4316 Spondylolisthesis, lumbar region: Secondary | ICD-10-CM | POA: Diagnosis not present

## 2016-03-28 DIAGNOSIS — M4806 Spinal stenosis, lumbar region: Secondary | ICD-10-CM | POA: Diagnosis not present

## 2016-03-28 DIAGNOSIS — G5603 Carpal tunnel syndrome, bilateral upper limbs: Secondary | ICD-10-CM | POA: Diagnosis not present

## 2016-04-05 ENCOUNTER — Ambulatory Visit (INDEPENDENT_AMBULATORY_CARE_PROVIDER_SITE_OTHER): Payer: PPO | Admitting: Cardiology

## 2016-04-05 ENCOUNTER — Encounter: Payer: Self-pay | Admitting: Cardiology

## 2016-04-05 VITALS — BP 119/72 | HR 68 | Ht 68.0 in | Wt 206.2 lb

## 2016-04-05 DIAGNOSIS — I340 Nonrheumatic mitral (valve) insufficiency: Secondary | ICD-10-CM | POA: Diagnosis not present

## 2016-04-05 DIAGNOSIS — I10 Essential (primary) hypertension: Secondary | ICD-10-CM

## 2016-04-05 DIAGNOSIS — I2510 Atherosclerotic heart disease of native coronary artery without angina pectoris: Secondary | ICD-10-CM

## 2016-04-05 DIAGNOSIS — E785 Hyperlipidemia, unspecified: Secondary | ICD-10-CM | POA: Diagnosis not present

## 2016-04-05 DIAGNOSIS — I5042 Chronic combined systolic (congestive) and diastolic (congestive) heart failure: Secondary | ICD-10-CM

## 2016-04-05 DIAGNOSIS — Z9861 Coronary angioplasty status: Secondary | ICD-10-CM

## 2016-04-05 NOTE — Patient Instructions (Signed)
Continue your current therapy. Use the metolazone as needed for weight gain  Continue your sodium and fluid restriction  I will see you in 4-6 months.

## 2016-04-05 NOTE — Progress Notes (Signed)
CARDIOLOGY OFFICE NOTE  Date:  04/05/2016    Kenneth [name] Date of Birth: [birth year] Medical Record [id number]  PCP:  Velna Hatchet, MD  Cardiologist:  Martinique  Chief Complaint  Patient presents with  . Follow-up     History of Present Illness: Kenneth [name] is a 70 y.o. male is seen for follow up CAD and CHF.  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 Other issues include HTN, HLD, tobacco abuse, statin intolerance and DM.  Echo in 3/16 showed EF 45% with wall motion abnormalities and moderate-severe  MR. Rght and left heart cath in 3/16 showing nonobstructive CAD (patent stents) and normal filling pressures. MR looked mild to moderate by LV-gram.   On 02/10/15 he was admitted with acute respiratory failure and acute pulmonary edema. BP was very high initially, SBP > 200. This was felt to be related to severe HTN with demand ischemia and more severe MR in setting of this.  Repeat Echo was done on 02/11/15 with mild to moderate MR and EF 55%. He was diuresed and placed on Imdur. Coreg and lisinopril were titrated.  He was readmitted in July 2016 with an episode of chest pain. He ruled out for MI. Repeat cardiac cath 05/15/15 showed nonobstructive disease. Prior stents patent. FFR of distal LAD moderate lesion was 0.82. He had one episode of NSVT 18 beats. Beta blocker was increased. Echo showed EF 40-45%.   On  visit in November 2016 he had a syncopal episode. Event monitor was unremarkable. No further syncope.  On follow up today he states he is doing well. He does experience SOB when going up hill or stairs. He had some difficulty with fluid retention in March that did not respond to lasix and he took metolazone with improvement. reports weight at home has been stable. He does feel more fatigued. He is following a 1 gram sodium restriction and restricting his fluid intake. He did fall in March and injured both rotator cuffs.  This is being treated with PT.  Past Medical History  Diagnosis Date  . Hypertension   . Hyperlipidemia     a. Statin intolerant, on fibrate.  . Tobacco abuse   . CAD S/P percutaneous coronary angioplasty 1999; 2014    a. 1999: s/p PTCA;  b. 11/2012 Cath/PCI: s/p DES to RCA, EF 40% inf HK;  c. 12/2012 Staged PCI of LAD w/ 3.0x12 Promus Premier DES. d. Cath 3/16: patent stents, EF 35-40%. e. 04/2015: cath stable - patent stents, nonobstructive CAD, borderline stenosis in the mid-distal LAD with FFR 0.82 (intermediate).  . Chronic diastolic CHF (congestive heart failure) ([place]) 09/18/2013  . H/O seasonal allergies   . Elevated uric acid in blood   . Osteoarthritis     "-knees, hands, wrist, fingers" (05/11/2015)  . History of gout   . Depression 1990's    "stopped RX after having suicidal thoughts" (05/11/2015)  . PTSD (post-traumatic stress disorder)     "Kenneth [name]"  . LV dysfunction     a. Varying EF from 35-40%, 40-45% on prior studies.  . Colon polyps 12/02/2013    Tubular adenoma-2, Hyperplastic polyps-3  . Myocardial infarction [facility])     '99- MI,"angioplasty", Stents 2& 3'14  . Type II diabetes mellitus ([place])     "A1C fine since 2012" (05/11/2015), no meds now  . Squamous cell carcinoma (HCC)     Back  . Melanoma ([place])  left arm    Past Surgical History  Procedure Laterality Date  . Carpal tunnel release Left 2000's  . Ankle reconstruction Right 1960    "had to break then rebuild"  . Total knee arthroplasty Right 06/08/2014    Procedure: RIGHT TOTAL KNEE ARTHROPLASTY;  Surgeon: Gearlean Alf, MD;  Location: [facility] ORS;  Service: Orthopedics;  Laterality: Right;  . Left heart catheterization with coronary angiogram N/A 12/27/2012    Procedure: LEFT HEART CATHETERIZATION WITH CORONARY ANGIOGRAM;  Surgeon: Jolaine Artist, MD;  Location: [facility] CATH LAB;  Service: Cardiovascular;  LM mild plaque, LAD 60-33m, LCX 30p, 58m, OM1 57m, RCA 30-40ost, 40-29m, 80-4m/d , 70-80d, EF 40%  .  Percutaneous coronary stent intervention (pci-s) N/A 12/30/2012    Procedure: PERCUTANEOUS CORONARY STENT INTERVENTION (PCI-S) - RCA;  Surgeon: Dahlila Pfahler M Martinique, MD;  Location: [facility] CATH LAB;  Service: Cardiovascular; 3.0x38, 3.0x32, 3.0x24 Promus Premier DESs   . Percutaneous coronary stent intervention (pci-s) N/A 01/14/2013    PERCUTANEOUS CORONARY STENT INTERVENTION (PCI-S);  Surgeon: Lindsey Demonte M Martinique, MD;  Location: [facility] CATH LAB;  Service: Cardiovascular;  Staged PCI of LAD w/ 3.0x12 Promus Premier DES.  Marland Kitchen Left and right heart catheterization with coronary angiogram N/A 01/12/2015    LEFT AND RIGHT HEART CATHETERIZATION WITH CORONARY ANGIOGRAM;  Surgeon: Washington Whedbee M Martinique, MD;  Location: [facility] CATH LAB;  Service: Cardiovascular;  Normal Right Heart Pressures. CO/CI (Fick) 5.27 L/min, 2.6 L/min/m2; LAD: mod Ca2+, pLAD 30%, mLAD Stentwidely patent\, m-dLAD ~40%;Cx:2 OMB, terminates as large OM3.  30% small OM1 & m-d Cx;Large, dominant. Prox, mid & distal stents patent ; EF ~35-40%  . Transthoracic echocardiogram  3/15/'216; 4/14/'16    a. 3/'16: EF 45%, basal to mid inferior akinesis and inferolateral to anterolateral hypokinesis. Grade 2 DD. 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 4/'16: EF 55% with basal inferior and mild posterior HK, Mild LVH. Mild-mod MR. Mild LA and RA dilation   . Inguinal hernia repair Right 2004 ?    w/mesh  . Joint replacement    . Coronary angioplasty  1999  . Coronary angioplasty with stent placement    . Squamous cell carcinoma excision  ~ 1997    "back"  . Esophagogastroduodenoscopy N/A 05/13/2015    Procedure: ESOPHAGOGASTRODUODENOSCOPY (EGD);  Surgeon: Jerene Bears, MD;  Location: [facility] ENDOSCOPY;  Service: Endoscopy;  Laterality: N/A;  . Cardiac catheterization N/A 05/14/2015    Procedure: Left Heart Cath and Coronary Angiography;  Surgeon: Lamoyne Hessel M Martinique, MD;  Location: [facility] CV LAB;  Service: Cardiovascular;  Laterality: N/A;  . Excision melanoma  with sentinel lymph node biopsy Left 12/13/2015    Procedure: WIDE EXCISION LEFT ARM  MELANOMA WITH LEFT AXILLARY  SENTINEL LYMPH NODE ;  Surgeon: Alphonsa Overall, MD;  Location: [facility];  Service: General;  Laterality: Left;     Medications: Current Outpatient Prescriptions  Medication Sig Dispense Refill  . allopurinol (ZYLOPRIM) 300 MG tablet Take 300 mg by mouth daily.    Marland Kitchen aspirin EC 81 MG tablet Take 81 mg by mouth daily.    . carvedilol (COREG) 6.25 MG tablet Take 1 tablet (6.25 mg total) by mouth 2 (two) times daily with a meal. 60 tablet 11  . cetirizine (ZYRTEC) 10 MG tablet Take 10 mg by mouth daily as needed for allergies. Reported on 11/03/2015    . Cholecalciferol (VITAMIN D3) 2000 UNITS TABS Take by mouth.    . clopidogrel (PLAVIX) 75 MG tablet  Take 75 mg by mouth daily.     . colchicine 0.6 MG tablet Take 0.6 mg by mouth daily as needed (Gout). Reported on 11/03/2015    . escitalopram (LEXAPRO) 10 MG tablet Take 10 mg by mouth daily. Take 1-2 tablets in the evening.  3  . fluticasone (FLONASE) 50 MCG/ACT nasal spray Place 2 sprays into the nose daily as needed for allergies.     . furosemide (LASIX) 40 MG tablet Take 1 tablet (40 mg total) by mouth daily.    Marland Kitchen HYDROcodone-acetaminophen (NORCO/VICODIN) 5-325 MG tablet Take 1-2 tablets by mouth every 6 (six) hours as needed. 30 tablet 0  . isosorbide mononitrate (IMDUR) 30 MG 24 hr tablet TAKE 1 TABLET (30 MG TOTAL) BY MOUTH 2 (TWO) TIMES DAILY. 60 tablet 11  . lisinopril (PRINIVIL,ZESTRIL) 40 MG tablet Take 20 mg by mouth every morning.    . metolazone (ZAROXOLYN) 2.5 MG tablet TAKE 1 TABLET BY MOUTH EVERY DAY 30 MINUTES AFTER LASIX  0  . Multiple Vitamin (MULTIVITAMIN) tablet Take 1 tablet by mouth daily.    . nitroGLYCERIN (NITROSTAT) 0.4 MG SL tablet Place 1 tablet (0.4 mg total) under the tongue every 5 (five) minutes x 3 doses as needed for chest pain. 25 tablet 2  . Probiotic Product (PROBIOTIC DAILY PO) Take 1  capsule by mouth daily.    . tamsulosin (FLOMAX) 0.4 MG CAPS capsule TAKE 1 TO 2 CAPSULES BY MOUTH EVERY DAY  3  . tetrahydrozoline 0.05 % ophthalmic solution Place 1 drop into both eyes 2 (two) times daily as needed (Itchy eyes).     No current facility-administered medications for this visit.    Allergies: Allergies  Allergen Reactions  . Crestor [Rosuvastatin]     Muscle aches at Crestor 5 mg daily  . Lipitor [Atorvastatin]     Muscle aches at 20 mg daily   . Pravastatin Sodium     Muscle aches at 20 mg and 40 mg daily  . Zocor [Simvastatin-High Dose]     Muscle aches at 20 mg and 40 mg daily    Social History: The patient  reports that he has been smoking Cigars.  He has never used smokeless tobacco. He reports that he drinks about 3.6 oz of alcohol per week. He reports that he does not use illicit drugs.   Family History: The patient's family history includes Alzheimer's disease in his mother; Arrhythmia in his sister; CVA in his father; Diabetes in his mother; Hypertension in his mother. There is no history of Colon cancer.   Review of Systems: Please see the history of present illness.    All other systems are reviewed and negative.   Physical Exam: VS:  BP 119/72 mmHg  Pulse 68  Ht 5\' 8"  (1.727 m)  Wt 206 lb 3.2 oz (93.532 kg)  BMI 31.36 kg/m2 .  BMI Body mass index is 31.36 kg/(m^2).   Wt Readings from Last 3 Encounters:  04/05/16 206 lb 3.2 oz (93.532 kg)  01/12/16 213 lb (96.616 kg)  12/15/15 211 lb 12.8 oz (96.072 kg)    General: Pleasant. Well developed, well nourished, NAD HEENT: Normal. Neck: Supple, no JVD, carotid bruits, or masses noted.  Cardiac: Regular rate and rhythm. No murmur heard on auscultation. Tr edema.  Respiratory:  Lungs are clear to auscultation bilaterally with normal work of breathing.  GI: Soft and nontender.  MS: No deformity or atrophy. Gait and ROM intact. Skin: Warm and dry. Color is normal.  Neuro:  Strength and sensation are  intact and no gross focal deficits noted.  Psych: Alert, appropriate and with normal affect.   LABORATORY DATA:  EKG:  Not done today.  Lab Results  Component Value Date   WBC 5.7 01/12/2016   HGB 11.2* 01/12/2016   HCT 32.5* 01/12/2016   PLT 195 01/12/2016   GLUCOSE 113 01/12/2016   CHOL 188 01/09/2015   TRIG 199* 01/09/2015   HDL 31* 01/09/2015   LDLCALC 117* 01/09/2015   ALT 22 01/12/2016   AST 20 01/12/2016   NA 141 01/12/2016   K 3.8 01/12/2016   CL 103 12/08/2015   CREATININE 1.0 01/12/2016   BUN 16.0 01/12/2016   CO2 28 01/12/2016   TSH 1.840 02/10/2015   INR 1.26 05/14/2015   HGBA1C 5.9* 02/10/2015   BNP (last 3 results)  Recent Labs  05/11/15 0227  BNP 595.6*    ProBNP (last 3 results) No results for input(s): PROBNP in the last 8760 hours.   Other Studies Reviewed Today:  2D Echo Jun 07, 2015 Study Conclusions  - Left ventricle: The cavity size was normal. There was mild concentric hypertrophy. Systolic function was mildly to moderately reduced. The estimated ejection fraction was in the range of 40% to 45%. There is akinesis of the basal-midinferior myocardium. Features are consistent with a pseudonormal left ventricular filling pattern, with concomitant abnormal relaxation and increased filling pressure (grade 2 diastolic dysfunction). - Aortic valve: Trileaflet; normal thickness, mildly calcified leaflets. - Mitral valve: Calcified annulus. Mildly thickened leaflets . There was mild regurgitation directed eccentrically and posteriorly. - Left atrium: The atrium was severely dilated. - Pulmonic valve: There was trivial regurgitation.  Cardiac cath: 05/14/15: Conclusion     A drug-eluting stent was placed.  Prox RCA lesion, 20% stenosed.  RPDA lesion, 20% stenosed.  1st Mrg lesion, 30% stenosed.  Dist Cx lesion, 30% stenosed.  Ost 2nd Mrg to 2nd Mrg lesion, 30% stenosed.  Prox LAD lesion, 40% stenosed.  A  drug-eluting stent was placed.  Dist LAD lesion, 55% stenosed.  1. Nonobstructive CAD. Multiple prior stents are still patent. No change from March 2016. 2. Borderline stenosis in the mid- distal LAD. This lesion is unchanged from prior studies. FFR is 0.82 in an intermediate range. Based on these findings I do not feel this lesion explains his resting chest pain. 3. Normal LV EDP.   Plan: continue medical therapy with beta blocker, nitrates and PPI. Continue long term DAPT. There appears to be an anxiety component to his symptoms- will give lorazepam prn. Consider a SSRI if symptoms persist     Assessment/Plan:  1.  Chronic Diastolic & Systolic CHF: H/o Ischemic cardiomyopathy with prior EF of 45% by echo. Repeat echo July 2016 showed EF 40-45%.  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  Volume status looks pretty good  today. I think he is still 2-3 lbs over his ideal weight. Continue current lasix dose and use metolazone prn.   2. CAD:  LHC in July 2016 showed patent stents.  Nonobstructive CAD.  Continue ASA 81 and Plavix. Does not tolerate statins. Continue current therapy.  3. Mitral regurgitation: Probably ischemic. Moderate.   4. HTN: well controlled  Today. Continue current therapy.  5. HLD - statin intolerant. Unable to afford Welchol.  6. Syncope. No recurrence. Event monitor unremarkable.   7. Malignant melanoma. S/p excision. Followed by oncology.   Current medicines are reviewed with  the patient today.  The patient does not have concerns regarding medicines other than what has been noted above.  The following changes have been made:  See above.  Labs/ tests ordered today include: none  No orders of the defined types were placed in this encounter.     Disposition:   FU with Dr. Martinique in 4-6  months.   Isadore Bokhari Martinique MD, [facility]

## 2016-05-08 ENCOUNTER — Other Ambulatory Visit: Payer: PPO

## 2016-05-08 ENCOUNTER — Ambulatory Visit: Payer: PPO | Admitting: Hematology & Oncology

## 2016-05-18 ENCOUNTER — Other Ambulatory Visit (HOSPITAL_BASED_OUTPATIENT_CLINIC_OR_DEPARTMENT_OTHER): Payer: PPO

## 2016-05-18 ENCOUNTER — Ambulatory Visit (HOSPITAL_BASED_OUTPATIENT_CLINIC_OR_DEPARTMENT_OTHER): Payer: PPO | Admitting: Hematology & Oncology

## 2016-05-18 ENCOUNTER — Encounter: Payer: Self-pay | Admitting: Hematology & Oncology

## 2016-05-18 VITALS — BP 114/58 | HR 60 | Temp 97.3°F | Resp 20 | Ht 68.0 in | Wt 206.0 lb

## 2016-05-18 DIAGNOSIS — I509 Heart failure, unspecified: Secondary | ICD-10-CM | POA: Diagnosis not present

## 2016-05-18 DIAGNOSIS — C4362 Malignant melanoma of left upper limb, including shoulder: Secondary | ICD-10-CM

## 2016-05-18 LAB — COMPREHENSIVE METABOLIC PANEL
ALT: 11 U/L (ref 0–55)
ANION GAP: 8 meq/L (ref 3–11)
AST: 14 U/L (ref 5–34)
Albumin: 3.6 g/dL (ref 3.5–5.0)
Alkaline Phosphatase: 96 U/L (ref 40–150)
BILIRUBIN TOTAL: 0.76 mg/dL (ref 0.20–1.20)
BUN: 20.5 mg/dL (ref 7.0–26.0)
CHLORIDE: 101 meq/L (ref 98–109)
CO2: 27 meq/L (ref 22–29)
CREATININE: 1.2 mg/dL (ref 0.7–1.3)
Calcium: 9.3 mg/dL (ref 8.4–10.4)
EGFR: 60 mL/min/{1.73_m2} — ABNORMAL LOW (ref >90)
GLUCOSE: 124 mg/dL (ref 70–140)
Potassium: 4.3 mEq/L (ref 3.5–5.1)
SODIUM: 136 meq/L (ref 136–145)
TOTAL PROTEIN: 7.3 g/dL (ref 6.4–8.3)

## 2016-05-18 LAB — CBC WITH DIFFERENTIAL (CANCER CENTER ONLY)
BASO#: 0 10*3/uL (ref 0.0–0.2)
BASO%: 0.4 % (ref 0.0–2.0)
EOS ABS: 0.2 10*3/uL (ref 0.0–0.5)
EOS%: 2.9 % (ref 0.0–7.0)
HCT: 37.4 % — ABNORMAL LOW (ref 38.7–49.9)
HEMOGLOBIN: 13.1 g/dL (ref 13.0–17.1)
LYMPH#: 1.4 10*3/uL (ref 0.9–3.3)
LYMPH%: 27.2 % (ref 14.0–48.0)
MCH: 32.6 pg (ref 28.0–33.4)
MCHC: 35 g/dL (ref 32.0–35.9)
MCV: 93 fL (ref 82–98)
MONO#: 0.5 10*3/uL (ref 0.1–0.9)
MONO%: 8.8 % (ref 0.0–13.0)
NEUT%: 60.7 % (ref 40.0–80.0)
NEUTROS ABS: 3.2 10*3/uL (ref 1.5–6.5)
PLATELETS: 179 10*3/uL (ref 145–400)
RBC: 4.02 10*6/uL — ABNORMAL LOW (ref 4.20–5.70)
RDW: 14.3 % (ref 11.1–15.7)
WBC: 5.2 10*3/uL (ref 4.0–10.0)

## 2016-05-18 LAB — LACTATE DEHYDROGENASE: LDH: 140 U/L (ref 125–245)

## 2016-05-18 NOTE — Progress Notes (Signed)
Hematology and Oncology Follow Up Visit  Kenneth [name] [id number] October 15, 1946 70 y.o. 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 We first saw him back in March. At that point in time, he just had his surgery. He had his surgery for the melanoma back in February. He had a wide local excision. His melanoma was 3.8 mm in depth. He had 2 lymph nodes that were negative. There is no lymphovascular invasion.  Since then, he's been doing well. He's been playing golf on Fridays. He is wearing sunscreen and making sure he drinks a lot of water when he is outside.  He has had no problem with headache. He's had no left arm swelling. He's had no cough. He's had no change in bowel or bladder habits.  His main problem now is congestive heart failure. This is limiting his activities. He does see cardiology.  He has had no rashes. He's not noted any suspicious skin lesions that have come up.  Overall, his performance status is ECOG 1.  Medications:  Current outpatient prescriptions:  .  allopurinol (ZYLOPRIM) 300 MG tablet, Take 300 mg by mouth daily., Disp: , Rfl:  .  aspirin EC 81 MG tablet, Take 81 mg by mouth daily., Disp: , Rfl:  .  carvedilol (COREG) 6.25 MG tablet, Take 1 tablet (6.25 mg total) by mouth 2 (two) times daily with a meal., Disp: 60 tablet, Rfl: 11 .  cetirizine (ZYRTEC) 10 MG tablet, Take 10 mg by mouth daily as needed for allergies. Reported on 11/03/2015, Disp: , Rfl:  .  Cholecalciferol (VITAMIN D3) 2000 UNITS TABS, Take by mouth., Disp: , Rfl:  .  clopidogrel (PLAVIX) 75 MG tablet, Take 75 mg by mouth daily. , Disp: , Rfl:  .  colchicine 0.6 MG tablet, Take 0.6 mg by mouth daily as needed (Gout). Reported on 11/03/2015, Disp: , Rfl:  .  escitalopram (LEXAPRO) 10 MG tablet, Take 10 mg by mouth daily. Take 1-2 tablets in the evening., Disp: ,  Rfl: 3 .  fluticasone (FLONASE) 50 MCG/ACT nasal spray, Place 2 sprays into the nose daily as needed for allergies. , Disp: , Rfl:  .  furosemide (LASIX) 40 MG tablet, Take 1 tablet (40 mg total) by mouth daily., Disp: , Rfl:  .  HYDROcodone-acetaminophen (NORCO/VICODIN) 5-325 MG tablet, Take 1-2 tablets by mouth every 6 (six) hours as needed., Disp: 30 tablet, Rfl: 0 .  isosorbide mononitrate (IMDUR) 30 MG 24 hr tablet, TAKE 1 TABLET (30 MG TOTAL) BY MOUTH 2 (TWO) TIMES DAILY., Disp: 60 tablet, Rfl: 11 .  lisinopril (PRINIVIL,ZESTRIL) 40 MG tablet, Take 20 mg by mouth every morning., Disp: , Rfl:  .  metolazone (ZAROXOLYN) 2.5 MG tablet, TAKE 1 TABLET BY MOUTH EVERY DAY 30 MINUTES AFTER LASIX, Disp: , Rfl: 0 .  Multiple Vitamin (MULTIVITAMIN) tablet, Take 1 tablet by mouth daily., Disp: , Rfl:  .  nitroGLYCERIN (NITROSTAT) 0.4 MG SL tablet, Place 1 tablet (0.4 mg total) under the tongue every 5 (five) minutes x 3 doses as needed for chest pain., Disp: 25 tablet, Rfl: 2 .  Probiotic Product (PROBIOTIC DAILY PO), Take 1 capsule by mouth daily., Disp: , Rfl:  .  tamsulosin (FLOMAX) 0.4 MG CAPS capsule, TAKE 1 TO 2 CAPSULES BY MOUTH EVERY DAY, Disp: , Rfl: 3 .  tetrahydrozoline 0.05 %  ophthalmic solution, Place 1 drop into both eyes 2 (two) times daily as needed (Itchy eyes)., Disp: , Rfl:   Allergies:  Allergies  Allergen Reactions  . Crestor [Rosuvastatin]     Muscle aches at Crestor 5 mg daily  . Lipitor [Atorvastatin]     Muscle aches at 20 mg daily   . Pravastatin Sodium     Muscle aches at 20 mg and 40 mg daily  . Zocor [Simvastatin-High Dose]     Muscle aches at 20 mg and 40 mg daily    Past Medical History, Surgical history, Social history, and Family History were reviewed and updated.  Review of Systems: Head and neck exam shows no ocular or oral lesions. He has no palpable cervical or supraclavicular lymph nodes. Lungs are clear. Cardiac exam regular rate and rhythm with a 1/6  systolic ejection murmur. Abdomen is soft. He has good bowel sounds. There is no fluid wave. There is no palpable liver or spleen tip. Back exam shows no tenderness over the spine, ribs or hips. Extremities shows no clubbing, cyanosis or edema. Skin exam shows some hyperpigmented lesions throughout his skin. However, nothing appears suspicious. Neurological exam is nonfocal  Physical Exam:  height is 5\' 8"  (1.727 m) and weight is 206 lb (93.441 kg). His oral temperature is 97.3 F (36.3 C). His blood pressure is 114/58 and his pulse is 60. His respiration is 20.   Wt Readings from Last 3 Encounters:  05/18/16 206 lb (93.441 kg)  04/05/16 206 lb 3.2 oz (93.532 kg)  01/12/16 213 lb (96.616 kg)     See above  Lab Results  Component Value Date   WBC 5.2 05/18/2016   HGB 13.1 05/18/2016   HCT 37.4* 05/18/2016   MCV 93 05/18/2016   PLT 179 05/18/2016     Chemistry      Component Value Date/Time   NA 136 05/18/2016 1103   NA 141 12/08/2015 1040   K 4.3 05/18/2016 1103   K 4.7 12/08/2015 1040   CL 103 12/08/2015 1040   CO2 27 05/18/2016 1103   CO2 27 12/08/2015 1040   BUN 20.5 05/18/2016 1103   BUN 22* 12/08/2015 1040   CREATININE 1.2 05/18/2016 1103   CREATININE 1.07 12/08/2015 1040   CREATININE 0.84 01/25/2015 1217      Component Value Date/Time   CALCIUM 9.3 05/18/2016 1103   CALCIUM 9.4 12/08/2015 1040   ALKPHOS 96 05/18/2016 1103   ALKPHOS 87 02/10/2015 0900   AST 14 05/18/2016 1103   AST 37 02/10/2015 0900   ALT 11 05/18/2016 1103   ALT 47 02/10/2015 0900   BILITOT 0.76 05/18/2016 1103   BILITOT 1.1 02/10/2015 0900         Impression and Plan: Kenneth [name] is a 70 year old gentleman. He had a stage IIb melanoma of the left arm. This was resected back in February.  For now, we will plan to get back in 6 months. I don't see that we have to do any scans on him. I cannot find anything suspicious on his physical exam.  His labs looked okay.  I think his prognosis  will be based upon his congestive heart failure.  I'll plan to see him back in 6 months.   Volanda Napoleon, MD 7/20/20171:42 PM

## 2016-06-15 ENCOUNTER — Other Ambulatory Visit: Payer: Self-pay | Admitting: Cardiology

## 2016-06-15 DIAGNOSIS — L570 Actinic keratosis: Secondary | ICD-10-CM | POA: Diagnosis not present

## 2016-06-15 DIAGNOSIS — L814 Other melanin hyperpigmentation: Secondary | ICD-10-CM | POA: Diagnosis not present

## 2016-06-15 DIAGNOSIS — L821 Other seborrheic keratosis: Secondary | ICD-10-CM | POA: Diagnosis not present

## 2016-06-15 DIAGNOSIS — Z85820 Personal history of malignant melanoma of skin: Secondary | ICD-10-CM | POA: Diagnosis not present

## 2016-06-15 DIAGNOSIS — D1801 Hemangioma of skin and subcutaneous tissue: Secondary | ICD-10-CM | POA: Diagnosis not present

## 2016-06-15 DIAGNOSIS — D485 Neoplasm of uncertain behavior of skin: Secondary | ICD-10-CM | POA: Diagnosis not present

## 2016-06-15 DIAGNOSIS — Z85828 Personal history of other malignant neoplasm of skin: Secondary | ICD-10-CM | POA: Diagnosis not present

## 2016-06-15 NOTE — Telephone Encounter (Signed)
REFILL 

## 2016-06-19 ENCOUNTER — Telehealth: Payer: Self-pay | Admitting: Cardiology

## 2016-06-19 NOTE — Telephone Encounter (Signed)
Surgical clearance routed to MD Martinique.

## 2016-06-19 NOTE — Telephone Encounter (Signed)
New message   Request for surgical clearance:  1. What type of surgery is being performed? Lumbar epidural   2. When is this surgery scheduled? 8.29.2017 @ 2:45 pm   3. Are there any medications that need to be held prior to surgery and how long? ASA & Plavix   4. Name of physician performing surgery? Dr. Brien Few  5. What is your office phone and fax number? ext [phone] fax [phone]. fax [phone]  8. [phone] number

## 2016-06-19 NOTE — Telephone Encounter (Signed)
Kenneth [name] is cleared for lumbar epidural. He may hold ASA and plavix for 5 days prior.  [name] Martinique MD, [facility]

## 2016-06-20 NOTE — Telephone Encounter (Signed)
Surgical clearance faxed to [phone] and [phone] @ [facility].  Fax # verified.

## 2016-06-27 DIAGNOSIS — M4806 Spinal stenosis, lumbar region: Secondary | ICD-10-CM | POA: Diagnosis not present

## 2016-09-04 DIAGNOSIS — N390 Urinary tract infection, site not specified: Secondary | ICD-10-CM | POA: Diagnosis not present

## 2016-09-04 DIAGNOSIS — Z125 Encounter for screening for malignant neoplasm of prostate: Secondary | ICD-10-CM | POA: Diagnosis not present

## 2016-09-04 DIAGNOSIS — R8299 Other abnormal findings in urine: Secondary | ICD-10-CM | POA: Diagnosis not present

## 2016-09-04 DIAGNOSIS — M109 Gout, unspecified: Secondary | ICD-10-CM | POA: Diagnosis not present

## 2016-09-04 DIAGNOSIS — E784 Other hyperlipidemia: Secondary | ICD-10-CM | POA: Diagnosis not present

## 2016-09-04 DIAGNOSIS — E119 Type 2 diabetes mellitus without complications: Secondary | ICD-10-CM | POA: Diagnosis not present

## 2016-09-11 DIAGNOSIS — C439 Malignant melanoma of skin, unspecified: Secondary | ICD-10-CM | POA: Diagnosis not present

## 2016-09-11 DIAGNOSIS — Z0000 Encounter for general adult medical examination without abnormal findings: Secondary | ICD-10-CM | POA: Diagnosis not present

## 2016-09-11 DIAGNOSIS — Z23 Encounter for immunization: Secondary | ICD-10-CM | POA: Diagnosis not present

## 2016-09-11 DIAGNOSIS — Z6832 Body mass index (BMI) 32.0-32.9, adult: Secondary | ICD-10-CM | POA: Diagnosis not present

## 2016-09-11 DIAGNOSIS — I509 Heart failure, unspecified: Secondary | ICD-10-CM | POA: Diagnosis not present

## 2016-09-11 DIAGNOSIS — E784 Other hyperlipidemia: Secondary | ICD-10-CM | POA: Diagnosis not present

## 2016-09-11 DIAGNOSIS — J449 Chronic obstructive pulmonary disease, unspecified: Secondary | ICD-10-CM | POA: Diagnosis not present

## 2016-09-11 DIAGNOSIS — Z1389 Encounter for screening for other disorder: Secondary | ICD-10-CM | POA: Diagnosis not present

## 2016-09-11 DIAGNOSIS — F419 Anxiety disorder, unspecified: Secondary | ICD-10-CM | POA: Diagnosis not present

## 2016-09-11 DIAGNOSIS — K7689 Other specified diseases of liver: Secondary | ICD-10-CM | POA: Diagnosis not present

## 2016-09-11 DIAGNOSIS — M109 Gout, unspecified: Secondary | ICD-10-CM | POA: Diagnosis not present

## 2016-09-11 DIAGNOSIS — I10 Essential (primary) hypertension: Secondary | ICD-10-CM | POA: Diagnosis not present

## 2016-09-11 DIAGNOSIS — K863 Pseudocyst of pancreas: Secondary | ICD-10-CM | POA: Diagnosis not present

## 2016-09-13 ENCOUNTER — Other Ambulatory Visit: Payer: Self-pay | Admitting: Internal Medicine

## 2016-09-14 ENCOUNTER — Other Ambulatory Visit: Payer: Self-pay | Admitting: Internal Medicine

## 2016-09-14 DIAGNOSIS — K7689 Other specified diseases of liver: Secondary | ICD-10-CM

## 2016-09-30 ENCOUNTER — Other Ambulatory Visit: Payer: Self-pay | Admitting: Physician Assistant

## 2016-10-17 ENCOUNTER — Telehealth: Payer: Self-pay | Admitting: Gastroenterology

## 2016-10-17 NOTE — Telephone Encounter (Signed)
I contacted the patient he reports that he had a positive hemosure at his primary care. I have contacted Dr. Allie Bossier to get a copy.  He had a colonoscopy in January of 2017 with polypectomy x4 and a recall for 5 years.  Dr. Fuller Plan does he need an appt or

## 2016-10-17 NOTE — Telephone Encounter (Signed)
GI appt only if he is having GI symptoms. With colonoscopy done in 10/2015 he does not need another colonoscopy. Recommend that he does not do routine annual screening FIT testing as he will have colonoscopies every 5 years.

## 2016-10-17 NOTE — Telephone Encounter (Signed)
Patient notified of the recommendations.  No symptoms at this time. He will call back for GI concerns

## 2016-10-18 DIAGNOSIS — Z1212 Encounter for screening for malignant neoplasm of rectum: Secondary | ICD-10-CM | POA: Diagnosis not present

## 2016-11-15 ENCOUNTER — Other Ambulatory Visit: Payer: Self-pay | Admitting: Cardiology

## 2016-11-20 ENCOUNTER — Ambulatory Visit: Payer: PPO | Admitting: Hematology & Oncology

## 2016-11-20 ENCOUNTER — Other Ambulatory Visit: Payer: PPO

## 2017-01-27 IMAGING — CR DG CHEST 2V
2 series · 2 of 2 positions shown · non-contrast
Comparison: Chest radiograph from 12/28/2012

CLINICAL DATA: Acute onset of sensation of indigestion at the
chest. High blood pressure. Initial encounter.

EXAM:
CHEST  2 VIEW

[w chest pa]
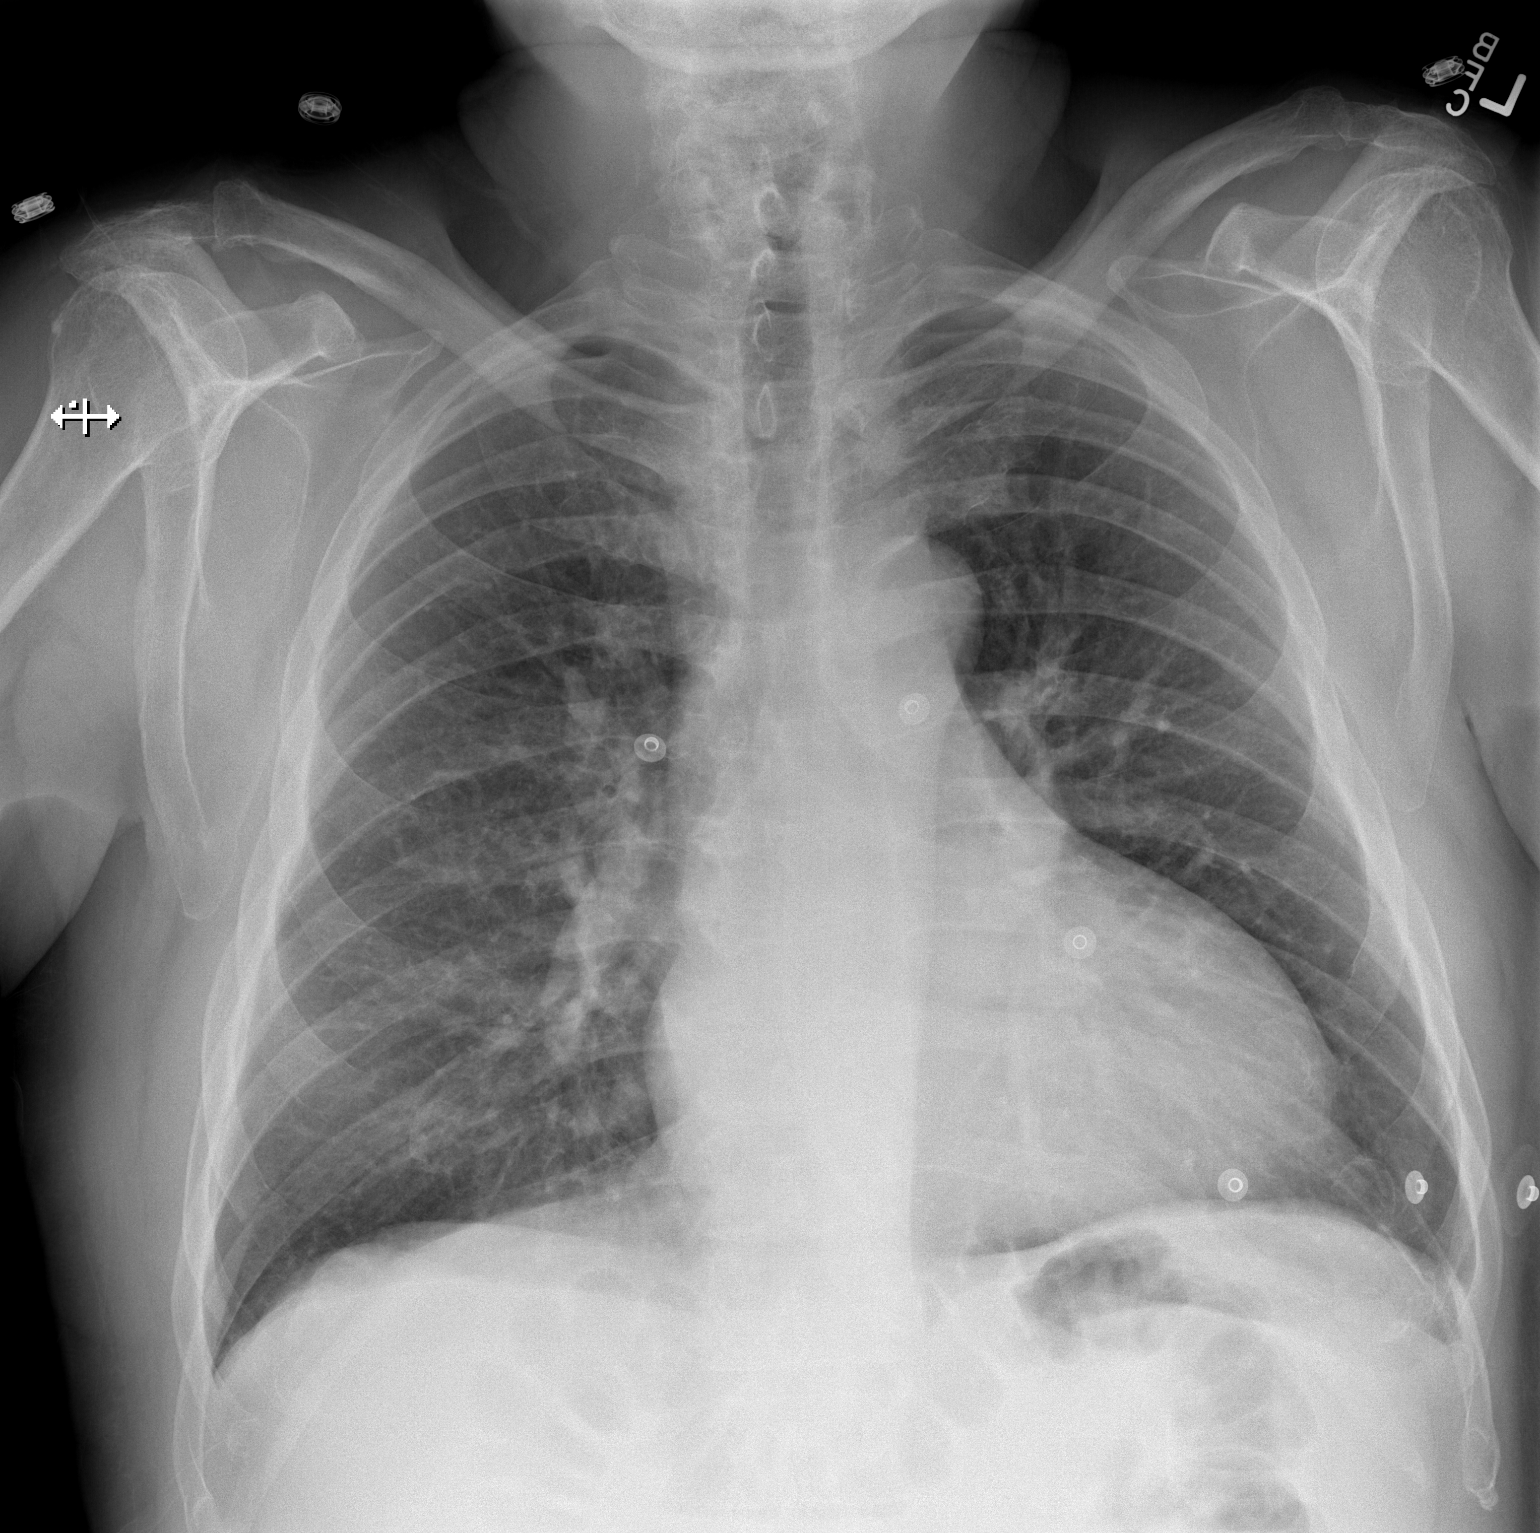

[w chest lat]
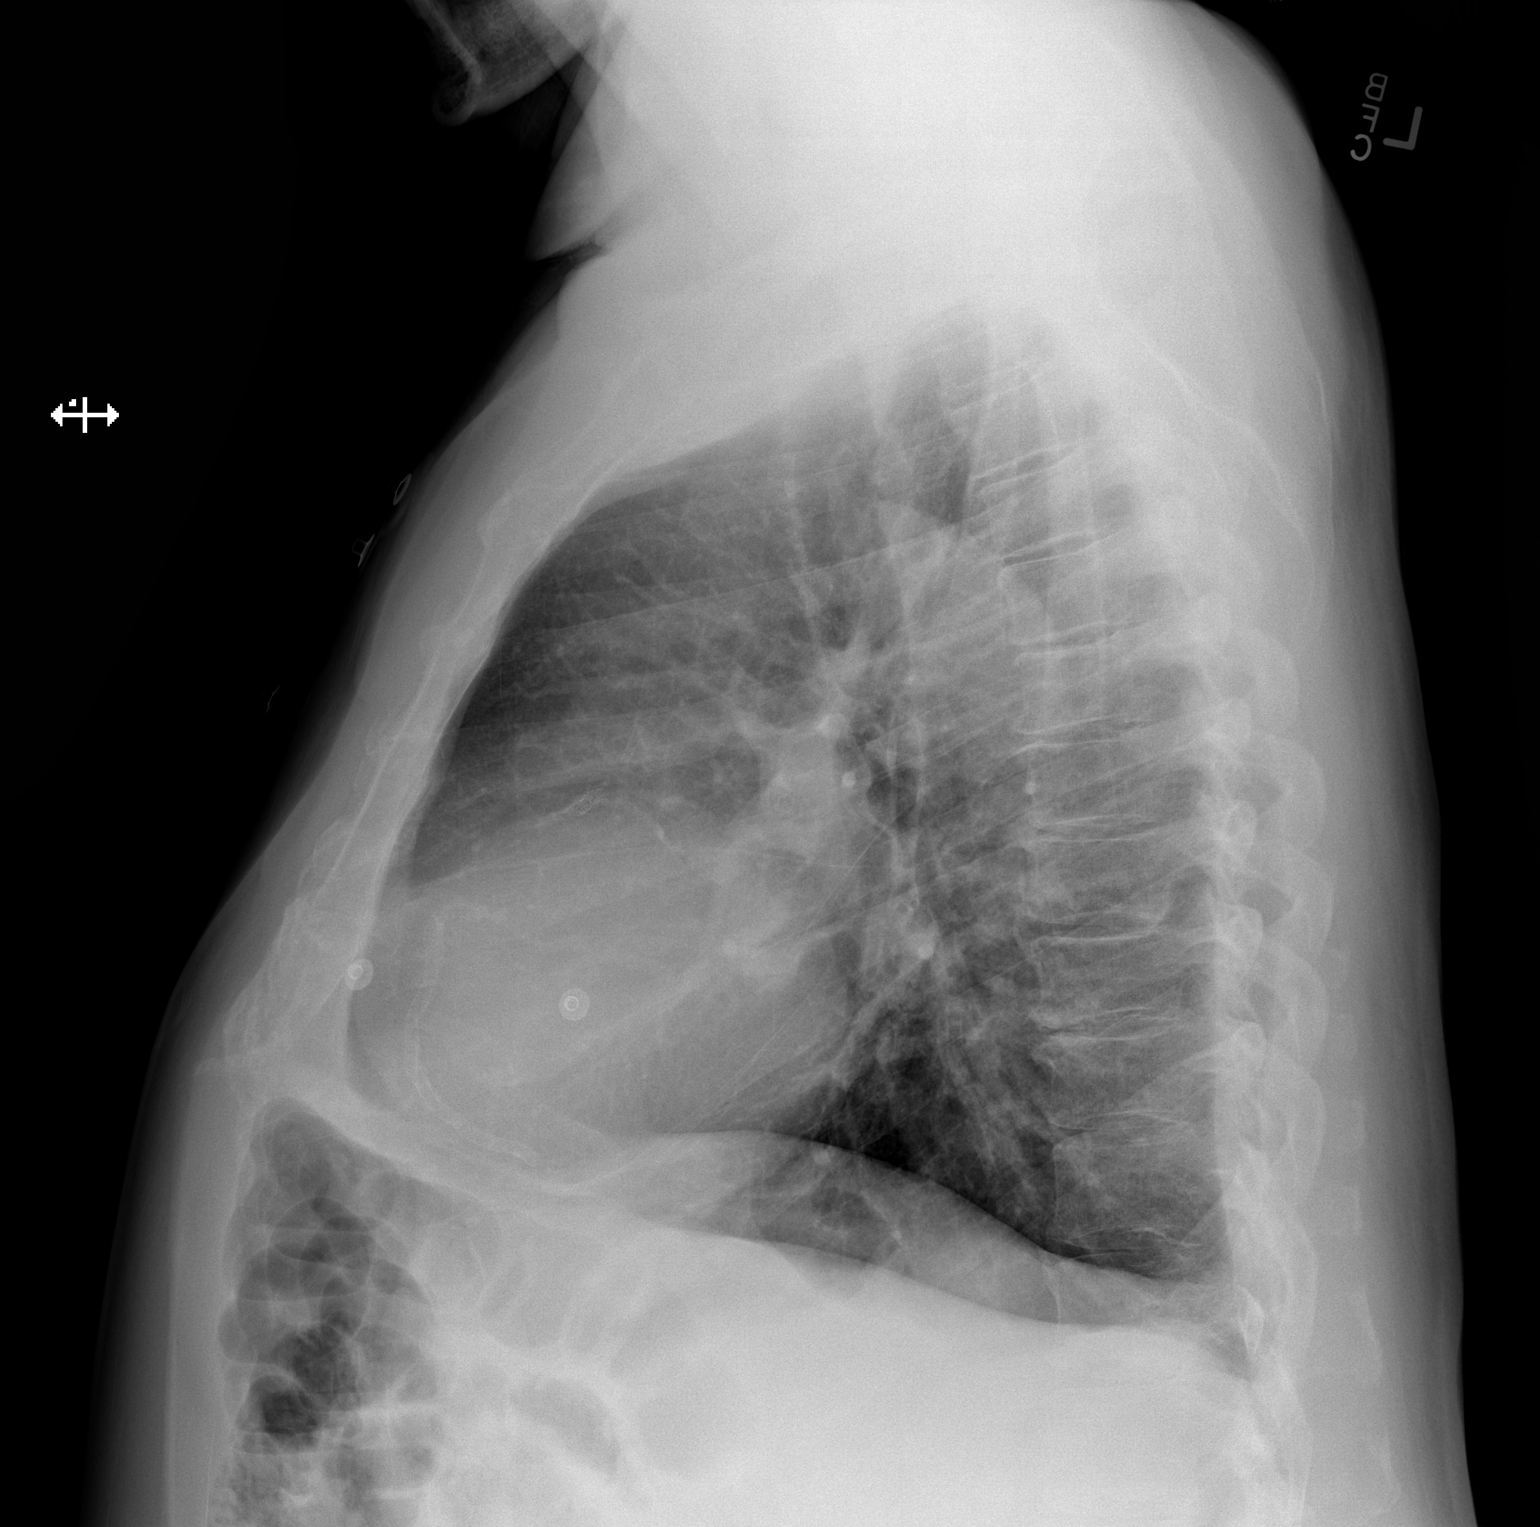

[2 of 2 positions shown; findings below may reference images not displayed]

FINDINGS: The lungs are well-aerated. Mild vascular congestion is noted. There
is no evidence of pleural effusion or pneumothorax.

The heart is normal in size; the mediastinal contour is within
normal limits. No acute osseous abnormalities are seen.
IMPRESSION: Mild vascular congestion noted; lungs remain grossly clear.

## 2017-04-24 ENCOUNTER — Other Ambulatory Visit: Payer: Self-pay | Admitting: *Deleted

## 2017-04-24 MED ORDER — CARVEDILOL 6.25 MG PO TABS: 6.2500 mg | ORAL_TABLET | Freq: Two times a day (BID) | ORAL | 0 refills | Status: DC

## 2017-07-30 ENCOUNTER — Other Ambulatory Visit: Payer: Self-pay

## 2017-07-30 MED ORDER — CARVEDILOL 6.25 MG PO TABS: 6.2500 mg | ORAL_TABLET | Freq: Two times a day (BID) | ORAL | 0 refills | Status: DC

## 2017-07-30 NOTE — Telephone Encounter (Signed)
Rx(s) sent to pharmacy electronically.  

## 2017-08-09 ENCOUNTER — Other Ambulatory Visit: Payer: Self-pay

## 2017-08-09 MED ORDER — CARVEDILOL 6.25 MG PO TABS: 6.2500 mg | ORAL_TABLET | Freq: Two times a day (BID) | ORAL | 0 refills | Status: AC

## 2017-11-13 ENCOUNTER — Other Ambulatory Visit: Payer: Self-pay

## 2017-11-13 NOTE — Telephone Encounter (Signed)
Patient has not seen Dr Martinique since 03/2016 and has made no follow-up appointment. Seems as though patient has moved to [place]. Attempted to reach patient to confirm and advise that he needs to seek refills for his medications from his primary care physician. Left message for patient to call back.

## 2017-12-22 IMAGING — NM NM LYMPHATICS/LYMPH NODE
8 series · 8 of 8 positions shown · non-contrast
Comparison: None.

CLINICAL DATA: The patient was injected intradermally with .5mci of
McWWm filtered sulfur colloid into the left forearm around the
melanoma site for the purpose of imaging mapping lymph nodes
associated with the melanoma.

EXAM:
NUCLEAR MEDICINE LYMPHANGIOGRAPHY
TECHNIQUE: Sequential images were obtained following intradermal injection of
radiopharmaceutical at the tumor site in the LEFT forearm.
RADIOPHARMACEUTICALS:  0.5 mCi millipore-filtered Fc-UUm sulfur
colloid

[Series 1: lymph ant/post · 2.07mm/px · 1 of 1 slices shown]
[im 1/1]
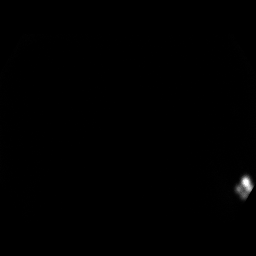

[Series 2: lymph ant/post 2 · 2.07mm/px · 1 of 1 slices shown]
[im 1/1]
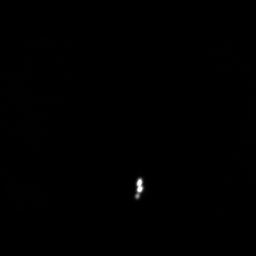

[Series 3: lymph ant/post 3 · 2.07mm/px · 1 of 1 slices shown]
[im 1/1]
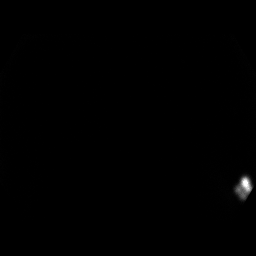

[Series 4: lymph ant/post 4 · 2.07mm/px · 1 of 1 slices shown]
[im 1/1  full-range]
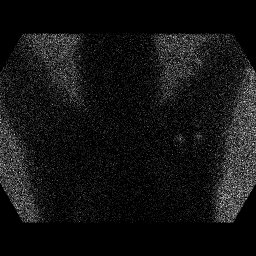

[Series 5: lymph lats · 2.07mm/px · 1 of 1 slices shown]
[im 1/1  full-range]
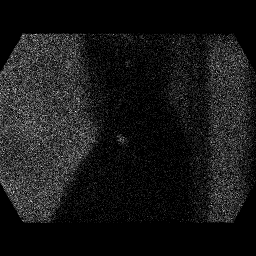

[Series 6: lymph lats 2 · 2.07mm/px · 1 of 1 slices shown]
[im 1/1  full-range]
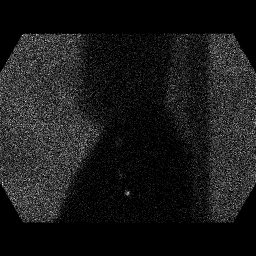

[Series 7: lymph ant/post 5 · 2.07mm/px · 1 of 1 slices shown]
[im 1/1  full-range]
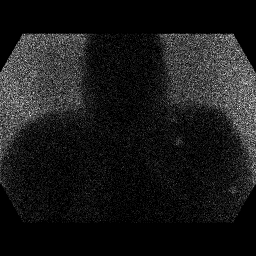

[Series 8: lymph ant/post 6 · 2.07mm/px · 1 of 1 slices shown]
[im 1/1]
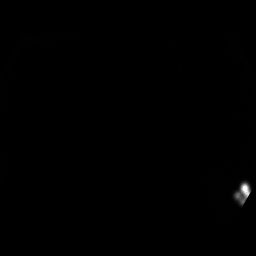

[8 of 8 positions shown; findings below may reference images not displayed]

FINDINGS: Injection site in the LEFT forearm is noted. On the lateral
projection a lymph channel is noted extending along the LEFT arm
with a interval lymph node in the LEFT upper arm.

Two lymph nodes map in the LEFT axilla.
IMPRESSION: 1. Two lymph nodes map in the LEFT axilla.
2. Interval lymph node along the lymph channel within the LEFT upper
arm.

## 2020-12-06 ENCOUNTER — Encounter: Payer: Self-pay | Admitting: Gastroenterology
